# Patient Record
Sex: Female | Born: 1945 | Race: White | Hispanic: No | Marital: Married | Smoking: Former smoker
Health system: Southern US, Community
[De-identification: ages and names within clinical notes are randomized; demographics above are authoritative.]

## PROBLEM LIST (undated history)

## (undated) DIAGNOSIS — I10 Essential (primary) hypertension: Secondary | ICD-10-CM

## (undated) DIAGNOSIS — R5382 Chronic fatigue, unspecified: Secondary | ICD-10-CM

## (undated) DIAGNOSIS — M797 Fibromyalgia: Secondary | ICD-10-CM

## (undated) DIAGNOSIS — F32A Depression, unspecified: Secondary | ICD-10-CM

## (undated) DIAGNOSIS — G43909 Migraine, unspecified, not intractable, without status migrainosus: Secondary | ICD-10-CM

## (undated) DIAGNOSIS — R5383 Other fatigue: Secondary | ICD-10-CM

## (undated) DIAGNOSIS — Z951 Presence of aortocoronary bypass graft: Secondary | ICD-10-CM

## (undated) DIAGNOSIS — I639 Cerebral infarction, unspecified: Secondary | ICD-10-CM

## (undated) DIAGNOSIS — I219 Acute myocardial infarction, unspecified: Secondary | ICD-10-CM

## (undated) DIAGNOSIS — E785 Hyperlipidemia, unspecified: Secondary | ICD-10-CM

## (undated) DIAGNOSIS — F329 Major depressive disorder, single episode, unspecified: Secondary | ICD-10-CM

## (undated) HISTORY — DX: Chronic fatigue, unspecified: R53.82

## (undated) HISTORY — DX: Presence of aortocoronary bypass graft: Z95.1

## (undated) HISTORY — DX: Depression, unspecified: F32.A

## (undated) HISTORY — PX: BLADDER SURGERY: SHX569

## (undated) HISTORY — DX: Cerebral infarction, unspecified: I63.9

## (undated) HISTORY — DX: Fibromyalgia: M79.7

## (undated) HISTORY — PX: BREAST LUMPECTOMY: SHX2

## (undated) HISTORY — PX: BREAST BIOPSY: SHX20

## (undated) HISTORY — DX: Acute myocardial infarction, unspecified: I21.9

## (undated) HISTORY — PX: BREAST EXCISIONAL BIOPSY: SUR124

## (undated) HISTORY — PX: WRIST SURGERY: SHX841

## (undated) HISTORY — PX: VAGINA SURGERY: SHX829

## (undated) HISTORY — DX: Hyperlipidemia, unspecified: E78.5

## (undated) HISTORY — DX: Migraine, unspecified, not intractable, without status migrainosus: G43.909

## (undated) HISTORY — DX: Major depressive disorder, single episode, unspecified: F32.9

## (undated) HISTORY — PX: CORONARY ARTERY BYPASS GRAFT: SHX141

## (undated) HISTORY — PX: BACK SURGERY: SHX140

## (undated) HISTORY — PX: CARPAL TUNNEL RELEASE: SHX101

## (undated) HISTORY — PX: TONSILLECTOMY: SUR1361

## (undated) HISTORY — DX: Other fatigue: R53.83

## (undated) HISTORY — PX: APPENDECTOMY: SHX54

## (undated) HISTORY — PX: ABDOMINAL HYSTERECTOMY: SHX81

---

## 1978-01-10 HISTORY — PX: OTHER SURGICAL HISTORY: SHX169

## 2014-01-15 DIAGNOSIS — S52611D Displaced fracture of right ulna styloid process, subsequent encounter for closed fracture with routine healing: Secondary | ICD-10-CM | POA: Diagnosis not present

## 2014-01-15 DIAGNOSIS — M170 Bilateral primary osteoarthritis of knee: Secondary | ICD-10-CM | POA: Diagnosis not present

## 2014-01-15 DIAGNOSIS — S52551D Other extraarticular fracture of lower end of right radius, subsequent encounter for closed fracture with routine healing: Secondary | ICD-10-CM | POA: Diagnosis not present

## 2014-01-20 DIAGNOSIS — M47816 Spondylosis without myelopathy or radiculopathy, lumbar region: Secondary | ICD-10-CM | POA: Diagnosis not present

## 2014-01-20 DIAGNOSIS — M47817 Spondylosis without myelopathy or radiculopathy, lumbosacral region: Secondary | ICD-10-CM | POA: Diagnosis not present

## 2014-01-20 DIAGNOSIS — M545 Low back pain: Secondary | ICD-10-CM | POA: Diagnosis not present

## 2014-02-05 DIAGNOSIS — M545 Low back pain: Secondary | ICD-10-CM | POA: Diagnosis not present

## 2014-02-05 DIAGNOSIS — Z79891 Long term (current) use of opiate analgesic: Secondary | ICD-10-CM | POA: Diagnosis not present

## 2014-02-05 DIAGNOSIS — G894 Chronic pain syndrome: Secondary | ICD-10-CM | POA: Diagnosis not present

## 2014-02-05 DIAGNOSIS — M5136 Other intervertebral disc degeneration, lumbar region: Secondary | ICD-10-CM | POA: Diagnosis not present

## 2014-02-05 DIAGNOSIS — M47817 Spondylosis without myelopathy or radiculopathy, lumbosacral region: Secondary | ICD-10-CM | POA: Diagnosis not present

## 2014-02-05 DIAGNOSIS — M961 Postlaminectomy syndrome, not elsewhere classified: Secondary | ICD-10-CM | POA: Diagnosis not present

## 2014-02-21 DIAGNOSIS — R509 Fever, unspecified: Secondary | ICD-10-CM | POA: Diagnosis not present

## 2014-02-21 DIAGNOSIS — I10 Essential (primary) hypertension: Secondary | ICD-10-CM | POA: Diagnosis not present

## 2014-02-21 DIAGNOSIS — R062 Wheezing: Secondary | ICD-10-CM | POA: Diagnosis not present

## 2014-02-27 DIAGNOSIS — M1811 Unilateral primary osteoarthritis of first carpometacarpal joint, right hand: Secondary | ICD-10-CM | POA: Diagnosis not present

## 2014-02-27 DIAGNOSIS — M19011 Primary osteoarthritis, right shoulder: Secondary | ICD-10-CM | POA: Diagnosis not present

## 2014-03-14 DIAGNOSIS — F341 Dysthymic disorder: Secondary | ICD-10-CM | POA: Diagnosis not present

## 2014-03-14 DIAGNOSIS — F4322 Adjustment disorder with anxiety: Secondary | ICD-10-CM | POA: Diagnosis not present

## 2014-04-03 DIAGNOSIS — M47817 Spondylosis without myelopathy or radiculopathy, lumbosacral region: Secondary | ICD-10-CM | POA: Diagnosis not present

## 2014-04-03 DIAGNOSIS — M545 Low back pain: Secondary | ICD-10-CM | POA: Diagnosis not present

## 2014-04-03 DIAGNOSIS — M961 Postlaminectomy syndrome, not elsewhere classified: Secondary | ICD-10-CM | POA: Diagnosis not present

## 2014-04-03 DIAGNOSIS — G894 Chronic pain syndrome: Secondary | ICD-10-CM | POA: Diagnosis not present

## 2014-04-03 DIAGNOSIS — M5136 Other intervertebral disc degeneration, lumbar region: Secondary | ICD-10-CM | POA: Diagnosis not present

## 2014-04-17 DIAGNOSIS — I2510 Atherosclerotic heart disease of native coronary artery without angina pectoris: Secondary | ICD-10-CM | POA: Diagnosis not present

## 2014-04-17 DIAGNOSIS — I200 Unstable angina: Secondary | ICD-10-CM | POA: Diagnosis not present

## 2014-04-17 DIAGNOSIS — N644 Mastodynia: Secondary | ICD-10-CM | POA: Diagnosis not present

## 2014-04-17 DIAGNOSIS — I10 Essential (primary) hypertension: Secondary | ICD-10-CM | POA: Diagnosis not present

## 2014-04-18 DIAGNOSIS — I10 Essential (primary) hypertension: Secondary | ICD-10-CM | POA: Diagnosis not present

## 2014-04-18 DIAGNOSIS — Z01812 Encounter for preprocedural laboratory examination: Secondary | ICD-10-CM | POA: Diagnosis not present

## 2014-04-18 DIAGNOSIS — Z951 Presence of aortocoronary bypass graft: Secondary | ICD-10-CM | POA: Diagnosis not present

## 2014-04-18 DIAGNOSIS — Z79899 Other long term (current) drug therapy: Secondary | ICD-10-CM | POA: Diagnosis not present

## 2014-04-18 DIAGNOSIS — I2510 Atherosclerotic heart disease of native coronary artery without angina pectoris: Secondary | ICD-10-CM | POA: Diagnosis not present

## 2014-04-18 DIAGNOSIS — R9431 Abnormal electrocardiogram [ECG] [EKG]: Secondary | ICD-10-CM | POA: Diagnosis not present

## 2014-04-18 DIAGNOSIS — I25110 Atherosclerotic heart disease of native coronary artery with unstable angina pectoris: Secondary | ICD-10-CM | POA: Diagnosis not present

## 2014-04-18 DIAGNOSIS — Z01810 Encounter for preprocedural cardiovascular examination: Secondary | ICD-10-CM | POA: Diagnosis not present

## 2014-04-22 DIAGNOSIS — N644 Mastodynia: Secondary | ICD-10-CM | POA: Diagnosis not present

## 2014-05-30 DIAGNOSIS — M1811 Unilateral primary osteoarthritis of first carpometacarpal joint, right hand: Secondary | ICD-10-CM | POA: Diagnosis not present

## 2014-05-30 DIAGNOSIS — M19011 Primary osteoarthritis, right shoulder: Secondary | ICD-10-CM | POA: Diagnosis not present

## 2014-06-02 DIAGNOSIS — G894 Chronic pain syndrome: Secondary | ICD-10-CM | POA: Diagnosis not present

## 2014-06-02 DIAGNOSIS — M47817 Spondylosis without myelopathy or radiculopathy, lumbosacral region: Secondary | ICD-10-CM | POA: Diagnosis not present

## 2014-06-02 DIAGNOSIS — M5136 Other intervertebral disc degeneration, lumbar region: Secondary | ICD-10-CM | POA: Diagnosis not present

## 2014-06-02 DIAGNOSIS — M545 Low back pain: Secondary | ICD-10-CM | POA: Diagnosis not present

## 2014-06-02 DIAGNOSIS — M961 Postlaminectomy syndrome, not elsewhere classified: Secondary | ICD-10-CM | POA: Diagnosis not present

## 2014-06-19 DIAGNOSIS — Z48812 Encounter for surgical aftercare following surgery on the circulatory system: Secondary | ICD-10-CM | POA: Diagnosis not present

## 2014-06-19 DIAGNOSIS — I2510 Atherosclerotic heart disease of native coronary artery without angina pectoris: Secondary | ICD-10-CM | POA: Diagnosis not present

## 2014-07-10 DIAGNOSIS — I10 Essential (primary) hypertension: Secondary | ICD-10-CM | POA: Diagnosis not present

## 2014-07-10 DIAGNOSIS — N3010 Interstitial cystitis (chronic) without hematuria: Secondary | ICD-10-CM | POA: Diagnosis not present

## 2014-07-10 DIAGNOSIS — R300 Dysuria: Secondary | ICD-10-CM | POA: Diagnosis not present

## 2014-07-23 DIAGNOSIS — R7301 Impaired fasting glucose: Secondary | ICD-10-CM | POA: Diagnosis not present

## 2014-07-23 DIAGNOSIS — R61 Generalized hyperhidrosis: Secondary | ICD-10-CM | POA: Diagnosis not present

## 2014-07-23 DIAGNOSIS — E785 Hyperlipidemia, unspecified: Secondary | ICD-10-CM | POA: Diagnosis not present

## 2014-07-23 DIAGNOSIS — I10 Essential (primary) hypertension: Secondary | ICD-10-CM | POA: Diagnosis not present

## 2014-07-23 DIAGNOSIS — F329 Major depressive disorder, single episode, unspecified: Secondary | ICD-10-CM | POA: Diagnosis not present

## 2014-07-23 DIAGNOSIS — M961 Postlaminectomy syndrome, not elsewhere classified: Secondary | ICD-10-CM | POA: Diagnosis not present

## 2014-08-05 DIAGNOSIS — G894 Chronic pain syndrome: Secondary | ICD-10-CM | POA: Diagnosis not present

## 2014-08-06 DIAGNOSIS — G894 Chronic pain syndrome: Secondary | ICD-10-CM | POA: Diagnosis not present

## 2014-08-13 DIAGNOSIS — M961 Postlaminectomy syndrome, not elsewhere classified: Secondary | ICD-10-CM | POA: Diagnosis not present

## 2014-08-13 DIAGNOSIS — M47817 Spondylosis without myelopathy or radiculopathy, lumbosacral region: Secondary | ICD-10-CM | POA: Diagnosis not present

## 2014-08-13 DIAGNOSIS — M545 Low back pain: Secondary | ICD-10-CM | POA: Diagnosis not present

## 2014-08-13 DIAGNOSIS — G894 Chronic pain syndrome: Secondary | ICD-10-CM | POA: Diagnosis not present

## 2014-08-13 DIAGNOSIS — M5136 Other intervertebral disc degeneration, lumbar region: Secondary | ICD-10-CM | POA: Diagnosis not present

## 2014-08-21 DIAGNOSIS — G4700 Insomnia, unspecified: Secondary | ICD-10-CM | POA: Diagnosis not present

## 2014-08-21 DIAGNOSIS — R61 Generalized hyperhidrosis: Secondary | ICD-10-CM | POA: Diagnosis not present

## 2014-08-21 DIAGNOSIS — R350 Frequency of micturition: Secondary | ICD-10-CM | POA: Diagnosis not present

## 2014-08-22 DIAGNOSIS — G894 Chronic pain syndrome: Secondary | ICD-10-CM | POA: Diagnosis not present

## 2014-08-26 DIAGNOSIS — M5416 Radiculopathy, lumbar region: Secondary | ICD-10-CM | POA: Diagnosis not present

## 2014-08-26 DIAGNOSIS — M961 Postlaminectomy syndrome, not elsewhere classified: Secondary | ICD-10-CM | POA: Diagnosis not present

## 2014-08-26 DIAGNOSIS — G894 Chronic pain syndrome: Secondary | ICD-10-CM | POA: Diagnosis not present

## 2014-08-26 DIAGNOSIS — M545 Low back pain: Secondary | ICD-10-CM | POA: Diagnosis not present

## 2014-09-10 DIAGNOSIS — M5416 Radiculopathy, lumbar region: Secondary | ICD-10-CM | POA: Diagnosis not present

## 2014-09-10 DIAGNOSIS — M545 Low back pain: Secondary | ICD-10-CM | POA: Diagnosis not present

## 2014-09-10 DIAGNOSIS — M961 Postlaminectomy syndrome, not elsewhere classified: Secondary | ICD-10-CM | POA: Diagnosis not present

## 2014-09-10 DIAGNOSIS — M47817 Spondylosis without myelopathy or radiculopathy, lumbosacral region: Secondary | ICD-10-CM | POA: Diagnosis not present

## 2014-09-10 DIAGNOSIS — G894 Chronic pain syndrome: Secondary | ICD-10-CM | POA: Diagnosis not present

## 2014-09-10 DIAGNOSIS — M5136 Other intervertebral disc degeneration, lumbar region: Secondary | ICD-10-CM | POA: Diagnosis not present

## 2014-09-24 DIAGNOSIS — L74519 Primary focal hyperhidrosis, unspecified: Secondary | ICD-10-CM | POA: Diagnosis not present

## 2014-09-26 DIAGNOSIS — L74519 Primary focal hyperhidrosis, unspecified: Secondary | ICD-10-CM | POA: Diagnosis not present

## 2014-10-06 DIAGNOSIS — Z79891 Long term (current) use of opiate analgesic: Secondary | ICD-10-CM | POA: Diagnosis not present

## 2014-10-06 DIAGNOSIS — M47817 Spondylosis without myelopathy or radiculopathy, lumbosacral region: Secondary | ICD-10-CM | POA: Diagnosis not present

## 2014-10-06 DIAGNOSIS — G894 Chronic pain syndrome: Secondary | ICD-10-CM | POA: Diagnosis not present

## 2014-10-06 DIAGNOSIS — M545 Low back pain: Secondary | ICD-10-CM | POA: Diagnosis not present

## 2014-10-06 DIAGNOSIS — M5136 Other intervertebral disc degeneration, lumbar region: Secondary | ICD-10-CM | POA: Diagnosis not present

## 2014-10-06 DIAGNOSIS — M5416 Radiculopathy, lumbar region: Secondary | ICD-10-CM | POA: Diagnosis not present

## 2014-10-06 DIAGNOSIS — M961 Postlaminectomy syndrome, not elsewhere classified: Secondary | ICD-10-CM | POA: Diagnosis not present

## 2014-10-10 DIAGNOSIS — M7551 Bursitis of right shoulder: Secondary | ICD-10-CM | POA: Diagnosis not present

## 2014-10-10 DIAGNOSIS — M1811 Unilateral primary osteoarthritis of first carpometacarpal joint, right hand: Secondary | ICD-10-CM | POA: Diagnosis not present

## 2014-11-05 DIAGNOSIS — D225 Melanocytic nevi of trunk: Secondary | ICD-10-CM | POA: Diagnosis not present

## 2014-11-05 DIAGNOSIS — H52223 Regular astigmatism, bilateral: Secondary | ICD-10-CM | POA: Diagnosis not present

## 2014-11-05 DIAGNOSIS — L538 Other specified erythematous conditions: Secondary | ICD-10-CM | POA: Diagnosis not present

## 2014-11-05 DIAGNOSIS — L821 Other seborrheic keratosis: Secondary | ICD-10-CM | POA: Diagnosis not present

## 2014-11-05 DIAGNOSIS — D485 Neoplasm of uncertain behavior of skin: Secondary | ICD-10-CM | POA: Diagnosis not present

## 2014-11-05 DIAGNOSIS — H35351 Cystoid macular degeneration, right eye: Secondary | ICD-10-CM | POA: Diagnosis not present

## 2014-11-05 DIAGNOSIS — H524 Presbyopia: Secondary | ICD-10-CM | POA: Diagnosis not present

## 2014-11-05 DIAGNOSIS — Z961 Presence of intraocular lens: Secondary | ICD-10-CM | POA: Diagnosis not present

## 2014-11-05 DIAGNOSIS — L720 Epidermal cyst: Secondary | ICD-10-CM | POA: Diagnosis not present

## 2014-11-05 DIAGNOSIS — D3131 Benign neoplasm of right choroid: Secondary | ICD-10-CM | POA: Diagnosis not present

## 2014-11-05 DIAGNOSIS — R202 Paresthesia of skin: Secondary | ICD-10-CM | POA: Diagnosis not present

## 2014-11-05 DIAGNOSIS — H5203 Hypermetropia, bilateral: Secondary | ICD-10-CM | POA: Diagnosis not present

## 2014-11-05 DIAGNOSIS — L298 Other pruritus: Secondary | ICD-10-CM | POA: Diagnosis not present

## 2014-11-05 DIAGNOSIS — L820 Inflamed seborrheic keratosis: Secondary | ICD-10-CM | POA: Diagnosis not present

## 2014-11-06 DIAGNOSIS — R61 Generalized hyperhidrosis: Secondary | ICD-10-CM | POA: Diagnosis not present

## 2014-11-06 DIAGNOSIS — Z23 Encounter for immunization: Secondary | ICD-10-CM | POA: Diagnosis not present

## 2014-11-14 DIAGNOSIS — I2510 Atherosclerotic heart disease of native coronary artery without angina pectoris: Secondary | ICD-10-CM | POA: Diagnosis not present

## 2014-11-14 DIAGNOSIS — R0689 Other abnormalities of breathing: Secondary | ICD-10-CM | POA: Diagnosis not present

## 2014-11-14 DIAGNOSIS — I10 Essential (primary) hypertension: Secondary | ICD-10-CM | POA: Diagnosis not present

## 2014-11-14 DIAGNOSIS — R079 Chest pain, unspecified: Secondary | ICD-10-CM | POA: Diagnosis not present

## 2014-11-19 DIAGNOSIS — D225 Melanocytic nevi of trunk: Secondary | ICD-10-CM | POA: Diagnosis not present

## 2014-11-24 DIAGNOSIS — R001 Bradycardia, unspecified: Secondary | ICD-10-CM | POA: Diagnosis not present

## 2014-11-24 DIAGNOSIS — I10 Essential (primary) hypertension: Secondary | ICD-10-CM | POA: Diagnosis not present

## 2014-11-24 DIAGNOSIS — I2510 Atherosclerotic heart disease of native coronary artery without angina pectoris: Secondary | ICD-10-CM | POA: Diagnosis not present

## 2014-11-24 DIAGNOSIS — I517 Cardiomegaly: Secondary | ICD-10-CM | POA: Diagnosis not present

## 2014-11-25 DIAGNOSIS — H35373 Puckering of macula, bilateral: Secondary | ICD-10-CM | POA: Diagnosis not present

## 2014-11-25 DIAGNOSIS — D3131 Benign neoplasm of right choroid: Secondary | ICD-10-CM | POA: Diagnosis not present

## 2014-12-01 DIAGNOSIS — I10 Essential (primary) hypertension: Secondary | ICD-10-CM | POA: Diagnosis not present

## 2014-12-01 DIAGNOSIS — I2510 Atherosclerotic heart disease of native coronary artery without angina pectoris: Secondary | ICD-10-CM | POA: Diagnosis not present

## 2014-12-01 DIAGNOSIS — R079 Chest pain, unspecified: Secondary | ICD-10-CM | POA: Diagnosis not present

## 2014-12-01 DIAGNOSIS — E785 Hyperlipidemia, unspecified: Secondary | ICD-10-CM | POA: Diagnosis not present

## 2014-12-03 DIAGNOSIS — G894 Chronic pain syndrome: Secondary | ICD-10-CM | POA: Diagnosis not present

## 2014-12-03 DIAGNOSIS — M47817 Spondylosis without myelopathy or radiculopathy, lumbosacral region: Secondary | ICD-10-CM | POA: Diagnosis not present

## 2014-12-03 DIAGNOSIS — M5136 Other intervertebral disc degeneration, lumbar region: Secondary | ICD-10-CM | POA: Diagnosis not present

## 2014-12-03 DIAGNOSIS — M5416 Radiculopathy, lumbar region: Secondary | ICD-10-CM | POA: Diagnosis not present

## 2014-12-03 DIAGNOSIS — M545 Low back pain: Secondary | ICD-10-CM | POA: Diagnosis not present

## 2014-12-03 DIAGNOSIS — M961 Postlaminectomy syndrome, not elsewhere classified: Secondary | ICD-10-CM | POA: Diagnosis not present

## 2014-12-12 DIAGNOSIS — I10 Essential (primary) hypertension: Secondary | ICD-10-CM | POA: Diagnosis not present

## 2014-12-12 DIAGNOSIS — R7301 Impaired fasting glucose: Secondary | ICD-10-CM | POA: Diagnosis not present

## 2014-12-12 DIAGNOSIS — I209 Angina pectoris, unspecified: Secondary | ICD-10-CM | POA: Diagnosis not present

## 2014-12-12 DIAGNOSIS — R197 Diarrhea, unspecified: Secondary | ICD-10-CM | POA: Diagnosis not present

## 2015-02-03 DIAGNOSIS — G894 Chronic pain syndrome: Secondary | ICD-10-CM | POA: Diagnosis not present

## 2015-02-03 DIAGNOSIS — M545 Low back pain: Secondary | ICD-10-CM | POA: Diagnosis not present

## 2015-02-03 DIAGNOSIS — M5136 Other intervertebral disc degeneration, lumbar region: Secondary | ICD-10-CM | POA: Diagnosis not present

## 2015-02-03 DIAGNOSIS — M961 Postlaminectomy syndrome, not elsewhere classified: Secondary | ICD-10-CM | POA: Diagnosis not present

## 2015-02-03 DIAGNOSIS — Z79891 Long term (current) use of opiate analgesic: Secondary | ICD-10-CM | POA: Diagnosis not present

## 2015-02-03 DIAGNOSIS — M47817 Spondylosis without myelopathy or radiculopathy, lumbosacral region: Secondary | ICD-10-CM | POA: Diagnosis not present

## 2015-02-03 DIAGNOSIS — M5416 Radiculopathy, lumbar region: Secondary | ICD-10-CM | POA: Diagnosis not present

## 2015-02-06 DIAGNOSIS — R42 Dizziness and giddiness: Secondary | ICD-10-CM | POA: Diagnosis not present

## 2015-02-06 DIAGNOSIS — R002 Palpitations: Secondary | ICD-10-CM | POA: Diagnosis not present

## 2015-02-12 DIAGNOSIS — R008 Other abnormalities of heart beat: Secondary | ICD-10-CM | POA: Diagnosis not present

## 2015-02-12 DIAGNOSIS — I491 Atrial premature depolarization: Secondary | ICD-10-CM | POA: Diagnosis not present

## 2015-02-13 DIAGNOSIS — I10 Essential (primary) hypertension: Secondary | ICD-10-CM | POA: Diagnosis not present

## 2015-02-13 DIAGNOSIS — F418 Other specified anxiety disorders: Secondary | ICD-10-CM | POA: Diagnosis not present

## 2015-02-13 DIAGNOSIS — R7301 Impaired fasting glucose: Secondary | ICD-10-CM | POA: Diagnosis not present

## 2015-02-13 DIAGNOSIS — E785 Hyperlipidemia, unspecified: Secondary | ICD-10-CM | POA: Diagnosis not present

## 2015-02-25 DIAGNOSIS — I10 Essential (primary) hypertension: Secondary | ICD-10-CM | POA: Diagnosis not present

## 2015-02-25 DIAGNOSIS — I2510 Atherosclerotic heart disease of native coronary artery without angina pectoris: Secondary | ICD-10-CM | POA: Diagnosis not present

## 2015-02-25 DIAGNOSIS — I4892 Unspecified atrial flutter: Secondary | ICD-10-CM | POA: Diagnosis not present

## 2015-03-19 DIAGNOSIS — R7301 Impaired fasting glucose: Secondary | ICD-10-CM | POA: Diagnosis not present

## 2015-03-19 DIAGNOSIS — I10 Essential (primary) hypertension: Secondary | ICD-10-CM | POA: Diagnosis not present

## 2015-03-19 DIAGNOSIS — I499 Cardiac arrhythmia, unspecified: Secondary | ICD-10-CM | POA: Diagnosis not present

## 2015-03-19 DIAGNOSIS — M810 Age-related osteoporosis without current pathological fracture: Secondary | ICD-10-CM | POA: Diagnosis not present

## 2015-03-24 DIAGNOSIS — W891XXA Exposure to tanning bed, initial encounter: Secondary | ICD-10-CM | POA: Diagnosis not present

## 2015-03-24 DIAGNOSIS — L74519 Primary focal hyperhidrosis, unspecified: Secondary | ICD-10-CM | POA: Diagnosis not present

## 2015-03-24 DIAGNOSIS — D1801 Hemangioma of skin and subcutaneous tissue: Secondary | ICD-10-CM | POA: Diagnosis not present

## 2015-03-24 DIAGNOSIS — L821 Other seborrheic keratosis: Secondary | ICD-10-CM | POA: Diagnosis not present

## 2015-03-24 DIAGNOSIS — M810 Age-related osteoporosis without current pathological fracture: Secondary | ICD-10-CM | POA: Diagnosis not present

## 2015-03-24 DIAGNOSIS — Z872 Personal history of diseases of the skin and subcutaneous tissue: Secondary | ICD-10-CM | POA: Diagnosis not present

## 2015-03-24 DIAGNOSIS — L814 Other melanin hyperpigmentation: Secondary | ICD-10-CM | POA: Diagnosis not present

## 2015-03-24 DIAGNOSIS — Z1231 Encounter for screening mammogram for malignant neoplasm of breast: Secondary | ICD-10-CM | POA: Diagnosis not present

## 2015-03-25 DIAGNOSIS — M5136 Other intervertebral disc degeneration, lumbar region: Secondary | ICD-10-CM | POA: Diagnosis not present

## 2015-03-25 DIAGNOSIS — M545 Low back pain: Secondary | ICD-10-CM | POA: Diagnosis not present

## 2015-03-25 DIAGNOSIS — G894 Chronic pain syndrome: Secondary | ICD-10-CM | POA: Diagnosis not present

## 2015-03-25 DIAGNOSIS — M961 Postlaminectomy syndrome, not elsewhere classified: Secondary | ICD-10-CM | POA: Diagnosis not present

## 2015-03-25 DIAGNOSIS — M47817 Spondylosis without myelopathy or radiculopathy, lumbosacral region: Secondary | ICD-10-CM | POA: Diagnosis not present

## 2015-03-25 DIAGNOSIS — M5416 Radiculopathy, lumbar region: Secondary | ICD-10-CM | POA: Diagnosis not present

## 2015-03-27 DIAGNOSIS — I10 Essential (primary) hypertension: Secondary | ICD-10-CM | POA: Diagnosis not present

## 2015-03-27 DIAGNOSIS — F329 Major depressive disorder, single episode, unspecified: Secondary | ICD-10-CM | POA: Diagnosis not present

## 2015-03-27 DIAGNOSIS — M810 Age-related osteoporosis without current pathological fracture: Secondary | ICD-10-CM | POA: Diagnosis not present

## 2015-03-27 DIAGNOSIS — R7301 Impaired fasting glucose: Secondary | ICD-10-CM | POA: Diagnosis not present

## 2015-04-03 DIAGNOSIS — I4892 Unspecified atrial flutter: Secondary | ICD-10-CM | POA: Diagnosis not present

## 2015-04-03 DIAGNOSIS — I10 Essential (primary) hypertension: Secondary | ICD-10-CM | POA: Diagnosis not present

## 2015-04-29 DIAGNOSIS — M549 Dorsalgia, unspecified: Secondary | ICD-10-CM | POA: Diagnosis not present

## 2015-04-29 DIAGNOSIS — G4700 Insomnia, unspecified: Secondary | ICD-10-CM | POA: Diagnosis not present

## 2015-04-29 DIAGNOSIS — N898 Other specified noninflammatory disorders of vagina: Secondary | ICD-10-CM | POA: Diagnosis not present

## 2015-04-29 DIAGNOSIS — N390 Urinary tract infection, site not specified: Secondary | ICD-10-CM | POA: Diagnosis not present

## 2015-05-11 DIAGNOSIS — R350 Frequency of micturition: Secondary | ICD-10-CM | POA: Diagnosis not present

## 2015-05-11 DIAGNOSIS — B379 Candidiasis, unspecified: Secondary | ICD-10-CM | POA: Diagnosis not present

## 2015-05-11 DIAGNOSIS — L089 Local infection of the skin and subcutaneous tissue, unspecified: Secondary | ICD-10-CM | POA: Diagnosis not present

## 2015-05-11 DIAGNOSIS — T3695XA Adverse effect of unspecified systemic antibiotic, initial encounter: Secondary | ICD-10-CM | POA: Diagnosis not present

## 2015-05-19 DIAGNOSIS — R300 Dysuria: Secondary | ICD-10-CM | POA: Diagnosis not present

## 2015-05-19 DIAGNOSIS — I10 Essential (primary) hypertension: Secondary | ICD-10-CM | POA: Diagnosis not present

## 2015-05-19 DIAGNOSIS — R61 Generalized hyperhidrosis: Secondary | ICD-10-CM | POA: Diagnosis not present

## 2015-05-19 DIAGNOSIS — L089 Local infection of the skin and subcutaneous tissue, unspecified: Secondary | ICD-10-CM | POA: Diagnosis not present

## 2015-05-19 DIAGNOSIS — G4700 Insomnia, unspecified: Secondary | ICD-10-CM | POA: Diagnosis not present

## 2015-05-20 DIAGNOSIS — T3695XA Adverse effect of unspecified systemic antibiotic, initial encounter: Secondary | ICD-10-CM | POA: Diagnosis not present

## 2015-05-20 DIAGNOSIS — B379 Candidiasis, unspecified: Secondary | ICD-10-CM | POA: Diagnosis not present

## 2015-05-20 DIAGNOSIS — R300 Dysuria: Secondary | ICD-10-CM | POA: Diagnosis not present

## 2015-05-20 DIAGNOSIS — N949 Unspecified condition associated with female genital organs and menstrual cycle: Secondary | ICD-10-CM | POA: Diagnosis not present

## 2015-06-17 DIAGNOSIS — M47817 Spondylosis without myelopathy or radiculopathy, lumbosacral region: Secondary | ICD-10-CM | POA: Diagnosis not present

## 2015-06-17 DIAGNOSIS — M961 Postlaminectomy syndrome, not elsewhere classified: Secondary | ICD-10-CM | POA: Diagnosis not present

## 2015-06-17 DIAGNOSIS — M5136 Other intervertebral disc degeneration, lumbar region: Secondary | ICD-10-CM | POA: Diagnosis not present

## 2015-06-17 DIAGNOSIS — M5416 Radiculopathy, lumbar region: Secondary | ICD-10-CM | POA: Diagnosis not present

## 2015-06-17 DIAGNOSIS — Z79891 Long term (current) use of opiate analgesic: Secondary | ICD-10-CM | POA: Diagnosis not present

## 2015-06-17 DIAGNOSIS — M545 Low back pain: Secondary | ICD-10-CM | POA: Diagnosis not present

## 2015-06-17 DIAGNOSIS — G894 Chronic pain syndrome: Secondary | ICD-10-CM | POA: Diagnosis not present

## 2015-06-17 DIAGNOSIS — M542 Cervicalgia: Secondary | ICD-10-CM | POA: Diagnosis not present

## 2015-06-17 DIAGNOSIS — M5412 Radiculopathy, cervical region: Secondary | ICD-10-CM | POA: Diagnosis not present

## 2015-06-23 DIAGNOSIS — M5412 Radiculopathy, cervical region: Secondary | ICD-10-CM | POA: Diagnosis not present

## 2015-06-23 DIAGNOSIS — H35373 Puckering of macula, bilateral: Secondary | ICD-10-CM | POA: Diagnosis not present

## 2015-06-23 DIAGNOSIS — D3131 Benign neoplasm of right choroid: Secondary | ICD-10-CM | POA: Diagnosis not present

## 2015-06-23 DIAGNOSIS — M4802 Spinal stenosis, cervical region: Secondary | ICD-10-CM | POA: Diagnosis not present

## 2015-06-23 DIAGNOSIS — M47892 Other spondylosis, cervical region: Secondary | ICD-10-CM | POA: Diagnosis not present

## 2015-06-23 DIAGNOSIS — M4722 Other spondylosis with radiculopathy, cervical region: Secondary | ICD-10-CM | POA: Diagnosis not present

## 2015-06-23 DIAGNOSIS — M2578 Osteophyte, vertebrae: Secondary | ICD-10-CM | POA: Diagnosis not present

## 2015-06-24 DIAGNOSIS — D649 Anemia, unspecified: Secondary | ICD-10-CM | POA: Diagnosis not present

## 2015-06-24 DIAGNOSIS — I10 Essential (primary) hypertension: Secondary | ICD-10-CM | POA: Diagnosis not present

## 2015-06-24 DIAGNOSIS — Z0000 Encounter for general adult medical examination without abnormal findings: Secondary | ICD-10-CM | POA: Diagnosis not present

## 2015-06-24 DIAGNOSIS — E785 Hyperlipidemia, unspecified: Secondary | ICD-10-CM | POA: Diagnosis not present

## 2015-06-24 DIAGNOSIS — Z6835 Body mass index (BMI) 35.0-35.9, adult: Secondary | ICD-10-CM | POA: Diagnosis not present

## 2015-06-24 DIAGNOSIS — E669 Obesity, unspecified: Secondary | ICD-10-CM | POA: Diagnosis not present

## 2015-06-24 DIAGNOSIS — J45909 Unspecified asthma, uncomplicated: Secondary | ICD-10-CM | POA: Diagnosis not present

## 2015-06-30 DIAGNOSIS — R61 Generalized hyperhidrosis: Secondary | ICD-10-CM | POA: Diagnosis not present

## 2015-06-30 DIAGNOSIS — N3001 Acute cystitis with hematuria: Secondary | ICD-10-CM | POA: Diagnosis not present

## 2015-06-30 DIAGNOSIS — N760 Acute vaginitis: Secondary | ICD-10-CM | POA: Diagnosis not present

## 2015-07-09 DIAGNOSIS — M542 Cervicalgia: Secondary | ICD-10-CM | POA: Diagnosis not present

## 2015-07-09 DIAGNOSIS — G894 Chronic pain syndrome: Secondary | ICD-10-CM | POA: Diagnosis not present

## 2015-07-09 DIAGNOSIS — M961 Postlaminectomy syndrome, not elsewhere classified: Secondary | ICD-10-CM | POA: Diagnosis not present

## 2015-07-09 DIAGNOSIS — M5136 Other intervertebral disc degeneration, lumbar region: Secondary | ICD-10-CM | POA: Diagnosis not present

## 2015-07-09 DIAGNOSIS — M5412 Radiculopathy, cervical region: Secondary | ICD-10-CM | POA: Diagnosis not present

## 2015-07-09 DIAGNOSIS — M5416 Radiculopathy, lumbar region: Secondary | ICD-10-CM | POA: Diagnosis not present

## 2015-07-09 DIAGNOSIS — M545 Low back pain: Secondary | ICD-10-CM | POA: Diagnosis not present

## 2015-07-09 DIAGNOSIS — M47817 Spondylosis without myelopathy or radiculopathy, lumbosacral region: Secondary | ICD-10-CM | POA: Diagnosis not present

## 2015-07-17 DIAGNOSIS — M1811 Unilateral primary osteoarthritis of first carpometacarpal joint, right hand: Secondary | ICD-10-CM | POA: Diagnosis not present

## 2015-07-17 DIAGNOSIS — M7551 Bursitis of right shoulder: Secondary | ICD-10-CM | POA: Diagnosis not present

## 2015-07-27 DIAGNOSIS — N3001 Acute cystitis with hematuria: Secondary | ICD-10-CM | POA: Diagnosis not present

## 2015-07-27 DIAGNOSIS — G894 Chronic pain syndrome: Secondary | ICD-10-CM | POA: Diagnosis not present

## 2015-08-07 ENCOUNTER — Emergency Department (INDEPENDENT_AMBULATORY_CARE_PROVIDER_SITE_OTHER)
Admission: EM | Admit: 2015-08-07 | Discharge: 2015-08-07 | Disposition: A | Discharge status: Home / Self Care | Payer: Medicare Other | Source: Home / Self Care | Attending: Family Medicine | Admitting: Family Medicine

## 2015-08-07 ENCOUNTER — Encounter: Payer: Self-pay | Admitting: *Deleted

## 2015-08-07 DIAGNOSIS — R300 Dysuria: Secondary | ICD-10-CM

## 2015-08-07 DIAGNOSIS — N390 Urinary tract infection, site not specified: Secondary | ICD-10-CM | POA: Diagnosis not present

## 2015-08-07 DIAGNOSIS — R350 Frequency of micturition: Secondary | ICD-10-CM | POA: Diagnosis not present

## 2015-08-07 HISTORY — DX: Essential (primary) hypertension: I10

## 2015-08-07 LAB — POCT URINALYSIS DIP (MANUAL ENTRY)
Glucose, UA: 100 — AB
Nitrite, UA: POSITIVE — AB
Protein Ur, POC: >=300 — AB
Spec Grav, UA: 1.02 (ref 1.005–1.03)
Urobilinogen, UA: >=8 (ref 0–1)
pH, UA: 5 (ref 5–8)

## 2015-08-07 MED ORDER — NITROFURANTOIN MONOHYD MACRO 100 MG PO CAPS: 100.0000 mg | ORAL_CAPSULE | Freq: Two times a day (BID) | ORAL | 0 refills | Status: DC

## 2015-08-07 MED ORDER — CEPHALEXIN 500 MG PO CAPS: 500.0000 mg | ORAL_CAPSULE | Freq: Two times a day (BID) | ORAL | 0 refills | Status: DC

## 2015-08-07 NOTE — ED Provider Notes (Signed)
CSN: [id number]     Arrival date & time 08/07/15  1504 History   First MD Initiated Contact with Patient 08/07/15 1531     Chief Complaint  Patient presents with  . Dysuria   (Consider location/radiation/quality/duration/timing/severity/associated sxs/prior Treatment) HPI Carrie Salazar is a 70 y.o. female presenting to UC with c/o intermittent urinary symptoms over the last 4 months with multiple rounds of antibiotics. Pt moved from [place] 1 week ago and started having worsening urinary symptoms with urinary frequency for 4 days and dysuria with lower abdominal discomfort for about 2 days. She has taken Azo with mild relief.  She has not been seen by a urologist as she her house sold and she moved sooner than she could f/u with a specialist. She was last on Doxycycline. Denies fever, chills, n/v/d. Besides the last 4 months, her last UTI was about 20-30 years ago.     Past Medical History:  Diagnosis Date  . Heart disease   . Hypertension    Past Surgical History:  Procedure Laterality Date  . ABDOMINAL HYSTERECTOMY    . APPENDECTOMY    . BACK SURGERY    . BLADDER SURGERY    . BREAST LUMPECTOMY    . BYPASS AXILLA/BRACHIAL ARTERY     History reviewed. No pertinent family history. Social History  Substance Use Topics  . Smoking status: Former Research scientist (life sciences)  . Smokeless tobacco: Never Used  . Alcohol use Yes     Comment: 1 q wk   OB History    No data available     Review of Systems  Constitutional: Negative for chills and fever.  Gastrointestinal: Positive for abdominal pain ("bladder pain"). Negative for diarrhea, nausea and vomiting.  Genitourinary: Positive for dysuria, frequency, pelvic pain and urgency. Negative for decreased urine volume, flank pain, hematuria, vaginal bleeding, vaginal discharge and vaginal pain.  Musculoskeletal: Negative for back pain and myalgias.  Neurological: Negative for syncope, light-headedness and headaches.    Allergies  Levaquin  [levofloxacin in d5w]; Codeine; Ivp dye [iodinated diagnostic agents]; Percodan [oxycodone-aspirin]; and Penicillins  Home Medications   Prior to Admission medications   Medication Sig Start Date End Date Taking? Authorizing Provider  Apixaban (ELIQUIS PO) Take by mouth.   Yes Historical Provider, MD  aspirin 81 MG tablet Take 81 mg by mouth daily.   Yes Historical Provider, MD  carvedilol (COREG) 12.5 MG tablet Take 12.5 mg by mouth 2 (two) times daily with a meal.   Yes Historical Provider, MD  DULoxetine (CYMBALTA) 30 MG capsule Take 30 mg by mouth daily.   Yes Historical Provider, MD  gabapentin (NEURONTIN) 300 MG capsule Take 300 mg by mouth 3 (three) times daily.   Yes Historical Provider, MD  lactobacillus acidophilus (BACID) TABS tablet Take 2 tablets by mouth 3 (three) times daily.   Yes Historical Provider, MD  lisinopril (PRINIVIL,ZESTRIL) 10 MG tablet Take 10 mg by mouth daily.   Yes Historical Provider, MD  pravastatin (PRAVACHOL) 20 MG tablet Take 20 mg by mouth daily.   Yes Historical Provider, MD  ranolazine (RANEXA) 500 MG 12 hr tablet Take 500 mg by mouth 2 (two) times daily.   Yes Historical Provider, MD  cephALEXin (KEFLEX) 500 MG capsule Take 1 capsule (500 mg total) by mouth 2 (two) times daily. For 7 days 08/07/15   Noland Fordyce, PA-C   Meds Ordered and Administered this Visit  Medications - No data to display  BP 134/72 (BP Location: Left Arm)   Pulse 66  Temp 97.5 F (36.4 C) (Oral)   Resp 16   Ht 5\' 5"  (1.651 m)   Wt 210 lb (95.3 kg)   SpO2 96%   BMI 34.95 kg/m  No data found.   Physical Exam  Constitutional: She is oriented to person, place, and time. She appears well-developed and well-nourished.  HENT:  Head: Normocephalic and atraumatic.  Eyes: EOM are normal.  Neck: Normal range of motion.  Cardiovascular: Normal rate.   Pulmonary/Chest: Effort normal.  Abdominal: Soft. She exhibits no distension and no mass. There is tenderness. There is no  rebound, no guarding and no CVA tenderness.  Mild suprapubic tenderness and left lower pelvic tenderness.   Musculoskeletal: Normal range of motion.  Neurological: She is alert and oriented to person, place, and time.  Skin: Skin is warm and dry.  Psychiatric: She has a normal mood and affect. Her behavior is normal.  Nursing note and vitals reviewed.   Urgent Care Course   Clinical Course    Procedures (including critical care time)  Labs Review Labs Reviewed  POCT URINALYSIS DIP (MANUAL ENTRY) - Abnormal; Notable for the following:       Result Value   Color, UA orange (*)    Glucose, UA =100 (*)    Bilirubin, UA moderate (*)    Ketones, POC UA small (15) (*)    Blood, UA small (*)    Protein Ur, POC >=300 (*)    Nitrite, UA Positive (*)    Leukocytes, UA large (3+) (*)    All other components within normal limits  URINE CULTURE    Imaging Review No results found.    MDM   1. Dysuria   2. UTI (lower urinary tract infection)   3. Urinary frequency    Pt c/o urinary symptoms for about 4 days but recurrent UTIs for 4 months. Pt appears well hydrated, NAD. Afebrile.   UA c/w UTI. Will send culture.  Rx: keflex (hx of hives with PCN but not anaphylaxis)  Advised to call if she develops rash or other allergic symptoms with Keflex. Will notify her when culture results come in. Encouraged f/u with PCP and urology, resource guide provided. May continue OTC azo, encouraged to stay well hydrated. Patient verbalized understanding and agreement with treatment plan.     Noland Fordyce, PA-C 08/07/15 1624

## 2015-08-07 NOTE — ED Triage Notes (Signed)
Pt c/o urinary frequency x 4 days with dysuria x 2 days. Denies fever. She reports a hx of recurrent UTI's x 4 mths.

## 2015-08-09 ENCOUNTER — Telehealth: Payer: Self-pay | Admitting: Emergency Medicine

## 2015-08-09 NOTE — Telephone Encounter (Signed)
Pt states that she is feeling somewhat better, the urgency and frequency is better.  Will f/u w/PCP if no better.  Urine culture results pending.  [place]

## 2015-08-10 ENCOUNTER — Telehealth: Payer: Self-pay | Admitting: *Deleted

## 2015-08-10 LAB — URINE CULTURE: Colony Count: >=100000

## 2015-08-10 NOTE — Telephone Encounter (Signed)
Spoke to pt given Ucx results. She reports that urinary s/s are resolving. She c/o diarrhea with ABT. She is taking probiotic. Advised her to try to eat yogurt and take ABT with food. She has a f/u appt with Dr Sheppard Coil on 08/13/15.

## 2015-08-13 ENCOUNTER — Encounter: Payer: Self-pay | Admitting: Osteopathic Medicine

## 2015-08-13 ENCOUNTER — Ambulatory Visit (INDEPENDENT_AMBULATORY_CARE_PROVIDER_SITE_OTHER): Payer: Medicare Other | Admitting: Osteopathic Medicine

## 2015-08-13 VITALS — BP 138/75 | HR 87 | Ht 65.0 in | Wt 208.0 lb

## 2015-08-13 DIAGNOSIS — I25709 Atherosclerosis of coronary artery bypass graft(s), unspecified, with unspecified angina pectoris: Secondary | ICD-10-CM | POA: Diagnosis not present

## 2015-08-13 DIAGNOSIS — M549 Dorsalgia, unspecified: Secondary | ICD-10-CM

## 2015-08-13 DIAGNOSIS — N952 Postmenopausal atrophic vaginitis: Secondary | ICD-10-CM

## 2015-08-13 DIAGNOSIS — G4700 Insomnia, unspecified: Secondary | ICD-10-CM | POA: Diagnosis not present

## 2015-08-13 DIAGNOSIS — R61 Generalized hyperhidrosis: Secondary | ICD-10-CM

## 2015-08-13 DIAGNOSIS — N390 Urinary tract infection, site not specified: Secondary | ICD-10-CM

## 2015-08-13 DIAGNOSIS — G8929 Other chronic pain: Secondary | ICD-10-CM | POA: Insufficient documentation

## 2015-08-13 DIAGNOSIS — L74519 Primary focal hyperhidrosis, unspecified: Secondary | ICD-10-CM

## 2015-08-13 MED ORDER — SUVOREXANT 10 MG PO TABS: 10.0000 mg | ORAL_TABLET | Freq: Every day | ORAL | 1 refills | Status: DC

## 2015-08-13 MED ORDER — CEPHALEXIN 250 MG PO CAPS: 250.0000 mg | ORAL_CAPSULE | Freq: Every day | ORAL | 6 refills | Status: DC

## 2015-08-13 MED ORDER — ESTRADIOL 0.1 MG/GM VA CREA: 0.5000 | TOPICAL_CREAM | VAGINAL | 12 refills | Status: DC

## 2015-08-13 NOTE — [place] Notes (Signed)
HPI: Carrie Salazar is a 70 y.o. Not Hispanic or Latino female  who presents to [facility] today, 08/13/15,  for chief complaint of:  Chief Complaint  Patient presents with  . Establish Care    Recently moved from [place] and here to establish care.   UTI - recurrent infections over the past year, Greater than 3. Patient had recently been treated in urgent care, urine culture results reviewed from that visit. 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 Previously seen by cardiologist and following q37mos. Having occasional palpitations. W/u by cardiology - Holter monitor.  MSK - Back surgeries, Medtronic device on R. Stable   SKIN - sweating issues, dripping sweat, very bothersome. Going on since the CABG surgery.   INSOMNIA - Off and on for years but has been worse over past 6 months. Patient has tried Ambien and Lunesta before without any relief.    Past medical, surgical, social and family history reviewed: Past Medical History:  Diagnosis Date  . Heart disease   . Hypertension    Past Surgical History:  Procedure Laterality Date  . ABDOMINAL HYSTERECTOMY    . APPENDECTOMY    . BACK SURGERY    . BLADDER SURGERY    . BREAST LUMPECTOMY    . BYPASS AXILLA/BRACHIAL ARTERY     Social History  Substance Use Topics  . Smoking status: Former Research scientist (life sciences)  . Smokeless tobacco: Never Used  . Alcohol use Yes     Comment: 1 q wk   No family history on file.   Current medication list and allergy/intolerance information reviewed:   Current Outpatient Prescriptions  Medication Sig Dispense Refill  . Apixaban (ELIQUIS PO) Take by mouth.    Marland Kitchen aspirin 81 MG tablet Take 81 mg by mouth daily.    . carvedilol (COREG) 12.5 MG tablet Take 12.5 mg by mouth 2 (two) times daily with a meal.    .  cephALEXin (KEFLEX) 500 MG capsule Take 1 capsule (500 mg total) by mouth 2 (two) times daily. For 7 days 14 capsule 0  . DULoxetine (CYMBALTA) 30 MG capsule Take 30 mg by mouth daily.    Marland Kitchen gabapentin (NEURONTIN) 300 MG capsule Take 300 mg by mouth 3 (three) times daily.    Marland Kitchen lactobacillus acidophilus (BACID) TABS tablet Take 2 tablets by mouth 3 (three) times daily.    Marland Kitchen lisinopril (PRINIVIL,ZESTRIL) 10 MG tablet Take 10 mg by mouth daily.    . pravastatin (PRAVACHOL) 20 MG tablet Take 20 mg by mouth daily.    . ranolazine (RANEXA) 500 MG 12 hr tablet Take 500 mg by mouth 2 (two) times daily.     No current facility-administered medications for this visit.    Allergies  Allergen Reactions  . Levaquin [Levofloxacin In D5w] Other (See Comments)    "I get real dizzy and pass out"   . Codeine   . Ivp Dye [Iodinated Diagnostic Agents]   . Percodan [Oxycodone-Aspirin]   . Penicillins Hives      Review of Systems:  Constitutional:  No  fever, no chills, (+) sweats, No recent illness, No unintentional weight changes. No significant fatigue.   HEENT: No  headache, no vision change, no hearing change, No sore throat, No  sinus pressure, (+) allergies  Cardiac: No  chest pain, No  pressure, (+) palpitations, No  Orthopnea  Respiratory:  No  shortness of breath. No  Cough  Gastrointestinal: No  abdominal pain, No  nausea, No  vomiting,  No  blood in stool, No  diarrhea, No  constipation   Musculoskeletal: No new myalgia/arthralgia, (+) chronic joint pain   Genitourinary: No  incontinence, No  abnormal genital bleeding, No abnormal genital discharge  Skin: No  Rash, No other wounds/concerning lesions  Hem/Onc: (+) easy bruising/bleeding states due to "heart meds", No  abnormal lymph node  Endocrine: No cold intolerance,  No heat intolerance. No polyuria/polydipsia/polyphagia   Neurologic: No  weakness, No  dizziness, No  slurred speech/focal weakness/facial droop  Psychiatric: No   concerns with depression, No  concerns with anxiety, (+) sleep problems, No mood problems  Exam:  BP 138/75   Pulse 87   Ht 5\' 5"  (1.651 m)   Wt 208 lb (94.3 kg)   BMI 34.61 kg/m   Constitutional: VS see above. General Appearance: alert, well-developed, well-nourished, NAD  Eyes: Normal lids and conjunctive, non-icteric sclera  Ears, Nose, Mouth, Throat: MMM,  Neck: No masses, trachea midline. No thyroid enlargement.  Respiratory: Normal respiratory effort. no wheeze, no rhonchi, no rales  Cardiovascular: S1/S2 normal, no murmur, no rub/gallop auscultated. RRR. No lower extremity edema  Musculoskeletal: Gait normal.  Neurological: No cranial nerve deficit on limited exam. Motor and sensation intact and symmetric. Cerebellar reflexes intact. Normal balance/coordination. No tremor.   Skin: warm, dry, intact. No rash/ulcer. Marland Kitchen    Psychiatric: Normal judgment/insight. Normal mood and affect. Oriented x3.     ASSESSMENT/PLAN: Please see separate assessment/plan notes, orders placed as below  Carrie Salazar was seen today for establish care.  Diagnoses and all orders for this visit:  Insomnia -     Suvorexant (BELSOMRA) 10 MG TABS; Take 10 mg by mouth at bedtime. For one week. If not effective, can increase to 2 tablets (20 mg total) by mouth at bedtime.  Recurrent UTI -     cephALEXin (KEFLEX) 250 MG capsule; Take 1 capsule (250 mg total) by mouth daily. For UTI prophylaxis -     estradiol (ESTRACE VAGINAL) 0.1 MG/GM vaginal cream; Place [id number] Applicatorfuls vaginally 3 (three) times a week.  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        Visit summary with medication list and  pertinent instructions was printed for patient to review. All questions at time of visit were answered - patient instructed to contact office with any additional concerns. ER/RTC precautions were reviewed with the patient. Follow-up plan: Return in about 6 weeks (around 09/24/2015), or sooner if needed, for INSOMNIA, separate visit for [place] depending on review of records.  Note: Total time spent 45 minutes, greater than 50% of the visit was spent face-to-face counseling and coordinating care for the following: The primary encounter diagnosis was Insomnia. 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

## 2015-08-13 NOTE — Assessment & Plan Note (Signed)
Culture results reviewed, allergies reviewed, initiate prophylactic antibiotics, continue 6 months and then recheck, restart vaginal estrogen as well

## 2015-08-13 NOTE — Patient Instructions (Signed)
We need records from your previous primary care provider, as well as your previous cardiologist. Please let us know if you don't hear back that Dr. Sheppard Coil has reviewed these records sometime in the next 1-2 weeks.  Let's plan to follow-up in the next 4-6 weeks to reevaluate insomnia and UTI issues.   We are starting Belsomra at 10 mg dose, do this for one week, do not increase dose prior to 7 days. If that dose is working, can continue it. After 7 days, if medication isn't effective, can increase to 20 mg/2 tablets.   We are starting a daily medication for prevention of UTI, we are refilling vaginal estrogen. Please let us know if any problems with these other medications.  Let's plan to schedule a separate visit for annual wellness exam sometime in the fall.

## 2015-08-13 NOTE — Assessment & Plan Note (Signed)
Previous trials of Lunesta and Ambien ineffective, initiate Belsomra

## 2015-08-13 NOTE — Assessment & Plan Note (Addendum)
Awaiting records from previous cardiology, plan to establish care locally

## 2015-08-13 NOTE — Assessment & Plan Note (Signed)
Documented problem with anti-anginal medication she is taking, will defer to cardiology alternative medicine

## 2015-08-20 ENCOUNTER — Ambulatory Visit (INDEPENDENT_AMBULATORY_CARE_PROVIDER_SITE_OTHER): Payer: Medicare Other | Admitting: Osteopathic Medicine

## 2015-08-20 ENCOUNTER — Encounter: Payer: Self-pay | Admitting: Osteopathic Medicine

## 2015-08-20 VITALS — BP 109/65 | HR 73 | Ht 65.0 in | Wt 209.0 lb

## 2015-08-20 DIAGNOSIS — G5793 Unspecified mononeuropathy of bilateral lower limbs: Secondary | ICD-10-CM

## 2015-08-20 DIAGNOSIS — I4892 Unspecified atrial flutter: Secondary | ICD-10-CM

## 2015-08-20 DIAGNOSIS — Z8669 Personal history of other diseases of the nervous system and sense organs: Secondary | ICD-10-CM

## 2015-08-20 DIAGNOSIS — G5791 Unspecified mononeuropathy of right lower limb: Secondary | ICD-10-CM | POA: Diagnosis not present

## 2015-08-20 DIAGNOSIS — M549 Dorsalgia, unspecified: Secondary | ICD-10-CM | POA: Diagnosis not present

## 2015-08-20 DIAGNOSIS — G4733 Obstructive sleep apnea (adult) (pediatric): Secondary | ICD-10-CM

## 2015-08-20 DIAGNOSIS — N390 Urinary tract infection, site not specified: Secondary | ICD-10-CM

## 2015-08-20 DIAGNOSIS — G8929 Other chronic pain: Secondary | ICD-10-CM

## 2015-08-20 DIAGNOSIS — G5792 Unspecified mononeuropathy of left lower limb: Secondary | ICD-10-CM

## 2015-08-20 DIAGNOSIS — I25709 Atherosclerosis of coronary artery bypass graft(s), unspecified, with unspecified angina pectoris: Secondary | ICD-10-CM | POA: Diagnosis not present

## 2015-08-20 DIAGNOSIS — N952 Postmenopausal atrophic vaginitis: Secondary | ICD-10-CM

## 2015-08-20 MED ORDER — GABAPENTIN 600 MG PO TABS: ORAL_TABLET | ORAL | 3 refills | Status: DC

## 2015-08-20 MED ORDER — NITROFURANTOIN MONOHYD MACRO 100 MG PO CAPS: 100.0000 mg | ORAL_CAPSULE | Freq: Two times a day (BID) | ORAL | 0 refills | Status: DC

## 2015-08-20 MED ORDER — TRAMADOL HCL 50 MG PO TABS: ORAL_TABLET | ORAL | 0 refills | Status: DC

## 2015-08-20 NOTE — Patient Instructions (Signed)
Need records - let us know if you don't hear that I have reviewed these by end of next week.   You should hear back form pain management about the Medtronic and management of your chronic pain/back problems. Please let us know if you don't hear back about an appointment.   Please review your medication list carefully, we have made a few changes  Tramadol as needed for SEVERE pain. DO NOT TAKE with Oxycodone.   Nitrofurantoin for UTI, stop Cephalexin - we need a urine sample, preferably before you start these antibiotics! Please bring your specimen back to the office or to the lab downstairs ASAP.  Increase Gabapentin, see the list for details.

## 2015-08-20 NOTE — [place] Notes (Signed)
HPI: Carrie Salazar is a 70 y.o. female  who presents to [place] today, 08/20/15,  for chief complaint of:  Chief Complaint  Patient presents with  . Medicare Wellness    Patient initially presented for Medicare wellness exam, will defer this for now since she has multiple medical complaints to address today and we still have received records regarding her previous vaccinations/other preventive care status.  Recurrent UTI: Patient was not able to tolerate the Keflex, stated that after a few day she started to feel itching, no rash, no shortness of breath. Occasionally will still have some urinary frequency issues. Unable to provide urine sample for [place] today.  Chronic back pain and neuropathy: Patient requests referral to pain management for her follow-up of her Medtronic stimulator. She states she has some leftover oxycodone which she is taking intermittently. Complains of significant nerve pain in the legs which is causing her to stay with the right. No saddle anesthesia symptoms.  I did receive previous records from her cardiologist, problem list was updated. Patient reports no chest pain, shortness of breath, dizziness,    Past medical, surgical, social and family history reviewed: Past Medical History:  Diagnosis Date  . Heart attack ([place])   . Heart disease   . Heart disease   . Hx of CABG   . Hyperlipidemia   . Hypertension    Past Surgical History:  Procedure Laterality Date  . ABDOMINAL HYSTERECTOMY    . APPENDECTOMY    . BACK SURGERY    . BLADDER SURGERY    . BREAST LUMPECTOMY    . BYPASS AXILLA/BRACHIAL ARTERY    . LYMPECTOMY  1980  . TONSILLECTOMY     Social History  Substance Use Topics  . Smoking status: Former Research scientist (life sciences)  . Smokeless tobacco: Never Used  . Alcohol use Yes     Comment: 1 q wk   Family History  Problem Relation Age of Onset  . Cancer Mother   . Hyperlipidemia Father   . Hypertension Father   . Stroke  Father   . Heart attack Father   . Hyperlipidemia Maternal Grandmother   . Hypertension Maternal Grandmother   . Stroke Maternal Grandmother      Current medication list and allergy/intolerance information reviewed:   Current Outpatient Prescriptions  Medication Sig Dispense Refill  . Apixaban (ELIQUIS PO) Take by mouth.    Marland Kitchen aspirin 81 MG tablet Take 81 mg by mouth daily.    . carvedilol (COREG) 12.5 MG tablet Take 12.5 mg by mouth 2 (two) times daily with a meal.    . DULoxetine (CYMBALTA) 30 MG capsule TK 1 C PO BID FOR DEPRESSION  0  . estradiol (ESTRACE VAGINAL) 0.1 MG/GM vaginal cream Place [id number] Applicatorfuls vaginally 3 (three) times a week. 42.5 g 12  . gabapentin (NEURONTIN) 300 MG capsule Take 300 mg by mouth 3 (three) times daily.    Marland Kitchen lactobacillus acidophilus (BACID) TABS tablet Take 2 tablets by mouth 3 (three) times daily.    Marland Kitchen lisinopril (PRINIVIL,ZESTRIL) 10 MG tablet Take 10 mg by mouth daily.    . pravastatin (PRAVACHOL) 20 MG tablet Take 20 mg by mouth daily.    . ranolazine (RANEXA) 500 MG 12 hr tablet Take 500 mg by mouth 2 (two) times daily.    . cephALEXin (KEFLEX) 250 MG capsule Take 1 capsule (250 mg total) by mouth daily. For UTI prophylaxis (Patient not taking: Reported on 08/20/2015) 30 capsule 6  .  Suvorexant (BELSOMRA) 10 MG TABS Take 10 mg by mouth at bedtime. For one week. If not effective, can increase to 2 tablets (20 mg total) by mouth at bedtime. (Patient not taking: Reported on 08/20/2015) 30 tablet 1   No current facility-administered medications for this visit.    Allergies  Allergen Reactions  . Levaquin [Levofloxacin In D5w] Other (See Comments)    "I get real dizzy and pass out"   . Codeine   . Ivp Dye [Iodinated Diagnostic Agents]   . Percodan [Oxycodone-Aspirin]   . Penicillins Hives      Review of Systems:  Constitutional:  No  fever, no chills,  HEENT: No  headache, no vision change,  Cardiac: No  chest pain, No   pressure  Respiratory:  No  shortness of breath.   Musculoskeletal: No new myalgia/arthralgia  Genitourinary: No  incontinence, No  abnormal genital bleeding, No abnormal genital discharge  Skin: No  Rash  Neurologic: No  weakness, No  dizziness,  Exam:  BP 109/65   Pulse 73   Ht 5\' 5"  (1.651 m)   Wt 209 lb (94.8 kg)   BMI 34.78 kg/m   Constitutional: VS see above. General Appearance: alert, well-developed, well-nourished, NAD  Ears, Nose, Mouth, Throat: MMM, Normal external inspection ears/nares/mouth/lips/gums.  Neck: No masses, trachea midline.  Respiratory: Normal respiratory effort. no wheeze, no rhonchi, no rales  Cardiovascular: S1/S2 normal, no murmur, no rub/gallop auscultated. RRR. No lower extremity edema.   Musculoskeletal: Gait normal  Skin: warm, dry, intact. No rash/ulcer.   Psychiatric: Normal judgment/insight. Normal mood and affect. Oriented x3.     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 All questions at time of visit were answered - patient instructed to contact office with any  additional concerns. ER/RTC precautions were reviewed with the patient. Follow-up plan: Return in about 4 weeks (around 09/17/2015) for FOLLOW UP ON MEDICATIONS AND PAIN - OR ANNUAL PHYSICAL IF DOING WELL.

## 2015-08-21 DIAGNOSIS — H1045 Other chronic allergic conjunctivitis: Secondary | ICD-10-CM | POA: Diagnosis not present

## 2015-08-21 DIAGNOSIS — H16213 Exposure keratoconjunctivitis, bilateral: Secondary | ICD-10-CM | POA: Diagnosis not present

## 2015-08-21 DIAGNOSIS — H04123 Dry eye syndrome of bilateral lacrimal glands: Secondary | ICD-10-CM | POA: Diagnosis not present

## 2015-08-21 DIAGNOSIS — Z961 Presence of intraocular lens: Secondary | ICD-10-CM | POA: Diagnosis not present

## 2015-08-21 LAB — POCT URINALYSIS DIPSTICK
Bilirubin, UA: NEGATIVE
Blood, UA: NEGATIVE
Glucose, UA: NEGATIVE
KETONES UA: NEGATIVE
LEUKOCYTES UA: NEGATIVE
NITRITE UA: NEGATIVE
PROTEIN UA: NEGATIVE
Spec Grav, UA: >=1.03
Urobilinogen, UA: 0.2
pH, UA: 5

## 2015-08-27 ENCOUNTER — Other Ambulatory Visit: Payer: Self-pay | Admitting: Osteopathic Medicine

## 2015-08-27 DIAGNOSIS — N390 Urinary tract infection, site not specified: Secondary | ICD-10-CM

## 2015-09-01 ENCOUNTER — Telehealth: Payer: Self-pay

## 2015-09-01 DIAGNOSIS — Z9689 Presence of other specified functional implants: Secondary | ICD-10-CM

## 2015-09-01 DIAGNOSIS — G8929 Other chronic pain: Secondary | ICD-10-CM

## 2015-09-01 DIAGNOSIS — Z9889 Other specified postprocedural states: Principal | ICD-10-CM

## 2015-09-01 NOTE — Telephone Encounter (Signed)
Patient called stated that during her OV it was discussed that a referral would be placed for pain Management. Patient is calling to follow up and she request a referral for a pain management doctor. Watson Robarge,CMA

## 2015-09-02 NOTE — Telephone Encounter (Signed)
Order is in, can let patient know. Thanks!

## 2015-09-02 NOTE — Telephone Encounter (Signed)
Patient informed. Rhonda Cunningham,CMA  

## 2015-09-24 ENCOUNTER — Other Ambulatory Visit: Payer: Self-pay | Admitting: Osteopathic Medicine

## 2015-09-24 ENCOUNTER — Ambulatory Visit (INDEPENDENT_AMBULATORY_CARE_PROVIDER_SITE_OTHER): Payer: Medicare Other | Admitting: Osteopathic Medicine

## 2015-09-24 ENCOUNTER — Encounter: Payer: Self-pay | Admitting: Osteopathic Medicine

## 2015-09-24 ENCOUNTER — Encounter (HOSPITAL_BASED_OUTPATIENT_CLINIC_OR_DEPARTMENT_OTHER): Payer: Self-pay

## 2015-09-24 ENCOUNTER — Emergency Department (HOSPITAL_BASED_OUTPATIENT_CLINIC_OR_DEPARTMENT_OTHER)
Admission: EM | Admit: 2015-09-24 | Discharge: 2015-09-24 | Disposition: A | Discharge status: Home / Self Care | Payer: Medicare Other | Attending: Emergency Medicine | Admitting: Emergency Medicine

## 2015-09-24 VITALS — BP 130/56 | HR 66 | Ht 65.0 in | Wt 208.0 lb

## 2015-09-24 DIAGNOSIS — T378X5A Adverse effect of other specified systemic anti-infectives and antiparasitics, initial encounter: Secondary | ICD-10-CM | POA: Diagnosis not present

## 2015-09-24 DIAGNOSIS — I2510 Atherosclerotic heart disease of native coronary artery without angina pectoris: Secondary | ICD-10-CM | POA: Insufficient documentation

## 2015-09-24 DIAGNOSIS — F329 Major depressive disorder, single episode, unspecified: Secondary | ICD-10-CM

## 2015-09-24 DIAGNOSIS — R300 Dysuria: Secondary | ICD-10-CM | POA: Diagnosis not present

## 2015-09-24 DIAGNOSIS — Z951 Presence of aortocoronary bypass graft: Secondary | ICD-10-CM | POA: Insufficient documentation

## 2015-09-24 DIAGNOSIS — G4700 Insomnia, unspecified: Secondary | ICD-10-CM

## 2015-09-24 DIAGNOSIS — T7840XA Allergy, unspecified, initial encounter: Secondary | ICD-10-CM

## 2015-09-24 DIAGNOSIS — N3281 Overactive bladder: Secondary | ICD-10-CM | POA: Diagnosis not present

## 2015-09-24 DIAGNOSIS — I10 Essential (primary) hypertension: Secondary | ICD-10-CM | POA: Diagnosis not present

## 2015-09-24 DIAGNOSIS — I25709 Atherosclerosis of coronary artery bypass graft(s), unspecified, with unspecified angina pectoris: Secondary | ICD-10-CM | POA: Diagnosis not present

## 2015-09-24 DIAGNOSIS — Z87891 Personal history of nicotine dependence: Secondary | ICD-10-CM | POA: Diagnosis not present

## 2015-09-24 DIAGNOSIS — N390 Urinary tract infection, site not specified: Secondary | ICD-10-CM

## 2015-09-24 DIAGNOSIS — M549 Dorsalgia, unspecified: Secondary | ICD-10-CM | POA: Diagnosis not present

## 2015-09-24 DIAGNOSIS — R21 Rash and other nonspecific skin eruption: Secondary | ICD-10-CM | POA: Diagnosis present

## 2015-09-24 DIAGNOSIS — F32A Depression, unspecified: Secondary | ICD-10-CM

## 2015-09-24 DIAGNOSIS — J302 Other seasonal allergic rhinitis: Secondary | ICD-10-CM

## 2015-09-24 MED ORDER — NITROFURANTOIN MACROCRYSTAL 100 MG PO CAPS: 100.0000 mg | ORAL_CAPSULE | Freq: Every day | ORAL | 6 refills | Status: DC

## 2015-09-24 MED ORDER — MIRTAZAPINE 15 MG PO TABS: 7.5000 mg | ORAL_TABLET | Freq: Every day | ORAL | 0 refills | Status: DC

## 2015-09-24 MED ORDER — DIPHENHYDRAMINE HCL 25 MG PO CAPS: 25.0000 mg | ORAL_CAPSULE | Freq: Four times a day (QID) | ORAL | 0 refills | Status: DC | PRN

## 2015-09-24 MED ORDER — FAMOTIDINE IN NACL 20-0.9 MG/50ML-% IV SOLN
20.0000 mg | Freq: Once | INTRAVENOUS | Status: AC
  Administered 2015-09-24: 20 mg via INTRAVENOUS
  Filled 2015-09-24: qty 50

## 2015-09-24 MED ORDER — SODIUM CHLORIDE 0.9 % IV BOLUS (SEPSIS): 250.0000 mL | Freq: Once | INTRAVENOUS | Status: DC

## 2015-09-24 MED ORDER — FAMOTIDINE 20 MG PO TABS: 20.0000 mg | ORAL_TABLET | Freq: Two times a day (BID) | ORAL | 0 refills | Status: DC

## 2015-09-24 MED ORDER — METHYLPREDNISOLONE SODIUM SUCC 125 MG IJ SOLR
125.0000 mg | Freq: Once | INTRAMUSCULAR | Status: AC
  Administered 2015-09-24: 125 mg via INTRAVENOUS
  Filled 2015-09-24: qty 2

## 2015-09-24 MED ORDER — SODIUM CHLORIDE 0.9 % IV SOLN: INTRAVENOUS | Status: DC

## 2015-09-24 MED ORDER — PREDNISONE 10 MG PO TABS: 40.0000 mg | ORAL_TABLET | Freq: Every day | ORAL | 0 refills | Status: DC

## 2015-09-24 MED ORDER — DOXYCYCLINE HYCLATE 100 MG PO CAPS: 100.0000 mg | ORAL_CAPSULE | Freq: Two times a day (BID) | ORAL | 0 refills | Status: DC

## 2015-09-24 NOTE — ED Notes (Signed)
EDP Zackowski notified of severity of allergic reaction

## 2015-09-24 NOTE — ED Notes (Signed)
Pt states she feels much better, redness and itching is better

## 2015-09-24 NOTE — Discharge Instructions (Signed)
Stop the Macrobid. Prescription provided for doxycycline but check with your primary care doctor to see what direction she wants to go with antibiotics for the urinary tract infection.  Take the Benadryl every 6 hours for the next 2 days. Take the prednisone for the next 5 days. Take the Pepcid for the next 7 days.  Return for any new or worse symptoms.

## 2015-09-24 NOTE — ED Notes (Signed)
Family at bedside. 

## 2015-09-24 NOTE — ED Provider Notes (Signed)
[facility] DEPT [facility] Provider Note   CSN: [id number] Arrival date & time: 09/24/15  2053  By signing my name below, I, Carrie Salazar, attest that this documentation has been prepared under the direction and in the presence of Carrie Sorrow, MD . Electronically Signed: Higinio Salazar, Scribe. 09/24/2015. 9:30 PM.  History   Chief Complaint Chief Complaint  Patient presents with  . Allergic Reaction   The history is provided by the patient. No language interpreter was used.   HPI Comments: Carrie Salazar is a 70 y.o. female with PMHx of MI, CAD, HLD and HTN, who presents to the Emergency Department complaining of gradual onset, red, scattered, pruritic rash that began at ~7:30 pm s/p beginning a new medication. Pt reports her rash is the most prevalent around her bilateral knees, chest, back,  posterior neck and head. She notes she visited her PCP this afternoon and was diagnosed with a UTI and prescribed nitrofurantoin. She states her rash appeared  soon after taking this medication; however, she has taken it before with no symptoms. Pt reports she was also prescribed doxycyline 1 month ago for her chronic UTI symptoms with no complications. She states she took 4 benadryl to relieve her symptoms PTA with no relief. She denies lip or tongue swelling and wheezing. Pt reports she is currently taking Carrie Salazar for A-FIb.   PCP: Dr. Sheppard Coil   Past Medical History:  Diagnosis Date  . Heart attack ([place])   . Heart disease   . Heart disease   . Hx of CABG   . Hyperlipidemia   . Hypertension     Patient Active Problem List   Diagnosis Date Noted  . History of obstructive sleep apnea 08/20/2015  . Atrial flutter ([place]) 08/20/2015  . 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 Chronic back pain 08/13/2015  . Recurrent UTI 08/13/2015  . Insomnia 08/13/2015  . Hyperhidrosis 08/13/2015    Past Surgical History:    Procedure Laterality Date  . ABDOMINAL HYSTERECTOMY    . APPENDECTOMY    . BACK SURGERY    . BLADDER SURGERY    . BREAST LUMPECTOMY    . BYPASS AXILLA/BRACHIAL ARTERY    . LYMPECTOMY  1980  . TONSILLECTOMY      OB History    No data available     Home Medications    Prior to Admission medications   Medication Sig Start Date End Date Taking? Authorizing Provider  Apixaban (Carrie Salazar PO) Take by mouth.    Historical Provider, MD  aspirin 81 MG tablet Take 81 mg by mouth daily.    Historical Provider, MD  carvedilol (COREG) 12.5 MG tablet Take 12.5 mg by mouth 2 (two) times daily with a meal.    Historical Provider, MD  diphenhydrAMINE (BENADRYL) 25 mg capsule Take 1 capsule (25 mg total) by mouth every 6 (six) hours as needed. 09/24/15   Carrie Sorrow, MD  doxycycline (VIBRAMYCIN) 100 MG capsule Take 1 capsule (100 mg total) by mouth 2 (two) times daily. 09/24/15   Carrie Sorrow, MD  estradiol (ESTRACE VAGINAL) 0.1 MG/GM vaginal cream Place [id number] Applicatorfuls vaginally 3 (three) times a week. 08/14/15   Carrie Reeve, DO  famotidine (PEPCID) 20 MG tablet Take 1 tablet (20 mg total) by mouth 2 (two) times daily. 09/24/15   Carrie Sorrow, MD  gabapentin (NEURONTIN) 600 MG tablet Take 1 tablet (600 mg total) by mouth in morning and afternoon. In even can  take 2 tablet (1200 mg total). 08/20/15   Carrie Reeve, DO  lactobacillus acidophilus (BACID) TABS tablet Take 2 tablets by mouth 3 (three) times daily.    Historical Provider, MD  lisinopril (PRINIVIL,ZESTRIL) 10 MG tablet Take 10 mg by mouth daily.    Historical Provider, MD  mirtazapine (REMERON) 15 MG tablet TAKE 1/2 TO 1 TABLET(7.5 TO 15 MG) BY MOUTH AT BEDTIME. CALL DOCTOR ALEXANDER IF INSOMNIA NOT IMPROVED IN 2 WEEKS 09/24/15   Carrie Reeve, DO  nitrofurantoin (MACRODANTIN) 100 MG capsule Take 1 capsule (100 mg total) by mouth at bedtime. 09/24/15   Carrie Reeve, DO  pravastatin (PRAVACHOL) 20 MG tablet Take 20 mg  by mouth daily.    Historical Provider, MD  predniSONE (DELTASONE) 10 MG tablet Take 4 tablets (40 mg total) by mouth daily. 09/24/15   Carrie Sorrow, MD  ranolazine (RANEXA) 500 MG 12 hr tablet Take 500 mg by mouth 2 (two) times daily.    Historical Provider, MD    Family History Family History  Problem Relation Age of Onset  . Cancer Mother   . Hyperlipidemia Father   . Hypertension Father   . Stroke Father   . Heart attack Father   . Hyperlipidemia Maternal Grandmother   . Hypertension Maternal Grandmother   . Stroke Maternal Grandmother     Social History Social History  Substance Use Topics  . Smoking status: Former Research scientist (life sciences)  . Smokeless tobacco: Never Used  . Alcohol use Yes     Comment: 1 q wk     Allergies   Levaquin [levofloxacin in d5w]; Codeine; Ivp dye [iodinated diagnostic agents]; Percocet [oxycodone-acetaminophen]; Percodan [oxycodone-aspirin]; and Penicillins  Review of Systems Review of Systems  Constitutional: Negative for chills and fever.  HENT: Negative for rhinorrhea, sore throat and trouble swallowing.   Eyes: Negative for visual disturbance.  Respiratory: Negative for cough, shortness of breath and wheezing.   Cardiovascular: Positive for leg swelling. Negative for chest pain.  Gastrointestinal: Negative for abdominal pain, diarrhea, nausea and vomiting.  Genitourinary: Positive for dysuria. Negative for hematuria.  Musculoskeletal: Positive for back pain.  Skin: Positive for color change and rash.  Neurological: Positive for headaches.  Hematological: Bruises/bleeds easily.   Physical Exam Updated Vital Signs BP 176/72 (BP Location: Left Arm)   Pulse 85   Temp 98.8 F (37.1 C) (Oral)   Resp 20   Ht 5\' 5"  (1.651 m)   Wt 208 lb (94.3 kg)   SpO2 97%   BMI 34.61 kg/m   Physical Exam  Constitutional: She is oriented to person, place, and time.  HENT:  Head: Normocephalic and atraumatic.  Mouth/Throat: Oropharynx is clear and moist.    Moist mucous membranes, no lip or tongue swelling  Redness to the back of her neck and up into posterior part of head   Eyes: EOM are normal. Pupils are equal, round, and reactive to light. No scleral icterus.  Eyes are tracking normally  Cardiovascular: Normal rate and regular rhythm.   Pulmonary/Chest:  Lungs clear to auscultation bilaterally   Abdominal: Soft. Bowel sounds are normal. There is no tenderness.  Musculoskeletal:  Intense redness around knees, proximal leg and distal thigh; blanching present with a component of hives  Neurological: She is alert and oriented to person, place, and time. Coordination normal.  Skin: No rash noted. No erythema. No pallor.   ED Treatments / Results  Labs (all labs ordered are listed, but only abnormal results are displayed) Labs Reviewed - No  data to display  EKG  EKG Interpretation None       Radiology No results found.  Procedures Procedures (including critical care time)  Medications Ordered in ED Medications  0.9 %  sodium chloride infusion (not administered)  sodium chloride 0.9 % bolus 250 mL (not administered)  methylPREDNISolone sodium succinate (SOLU-MEDROL) 125 mg/2 mL injection 125 mg (125 mg Intravenous Given 09/24/15 2142)  famotidine (PEPCID) IVPB 20 mg premix (0 mg Intravenous Stopped 09/24/15 2227)    Initial Impression / Assessment and Salazar / ED Course  I have reviewed the triage vital signs and the nursing notes.  Pertinent labs & imaging results that were available during my care of the patient were reviewed by me and considered in my medical decision making (see chart for details).  Clinical Course  DIAGNOSTIC STUDIES:  Oxygen Saturation is 95% on RA, normal by my interpretation.    COORDINATION OF CARE:  9:27 PM Discussed treatment Salazar with pt at bedside and pt agreed to Salazar.  Patient presented with hives erythema consistent with an acute allergic reaction most likely due to the [place]. Will have  patient stop the Macrobid. Patient did take Benadryl at home. Never had any lip swelling no tongue swelling no wheezing. Patient treated here with Pepcid and Solu-Medrol hives of almost completely resolved. Patient is stable nontoxic and can be discharged home.  Patient will be given a prescription for doxycycline if her primary care doctor wants to go in that direction to treat the urinary tract infection. She'll be continued on Benadryl for 2 days Pepcid for 7 days and prednisone for 5 days. Patient will return for any new or worse symptoms.  I personally performed the services described in this documentation, which was scribed in my presence. The recorded information has been reviewed and is accurate.   Final Clinical Impressions(s) / ED Diagnoses   Final diagnoses:  Allergic reaction, initial encounter    New Prescriptions New Prescriptions   DIPHENHYDRAMINE (BENADRYL) 25 MG CAPSULE    Take 1 capsule (25 mg total) by mouth every 6 (six) hours as needed.   DOXYCYCLINE (VIBRAMYCIN) 100 MG CAPSULE    Take 1 capsule (100 mg total) by mouth 2 (two) times daily.   FAMOTIDINE (PEPCID) 20 MG TABLET    Take 1 tablet (20 mg total) by mouth 2 (two) times daily.   PREDNISONE (DELTASONE) 10 MG TABLET    Take 4 tablets (40 mg total) by mouth daily.     Carrie Sorrow, MD 09/24/15 [phone]

## 2015-09-24 NOTE — ED Triage Notes (Addendum)
Pt c/o scattered hives, itching after starting nitrofurantoin today for UTI-took benadryl x 4 approx 730pm without relief-no resp distress noted-steady gait

## 2015-09-24 NOTE — ED Notes (Signed)
Patient denies pain but itching is very intense rates 9/10 scale

## 2015-09-24 NOTE — Progress Notes (Addendum)
HPI: Carrie Salazar is a 70 y.o. female  who presents to [facility] today, 09/24/15,  for chief complaint of:  Chief Complaint  Patient presents with  . Follow-up    INSOMINIA     BLADDER - Macrobid, took 5 days, this worked for UTI. Was also given estrace Rx and using this as well, hasn't noticed much difference with this. Still requesting daily antibiotic for prophylaxis, she is having multiple UTIs within the past year, though I don't have records available from her previous PCP  ALLERGIES - not on any OTC meds, sinus pressure is an issue for her.   INSOMNIA - Belsomra was not helpful, patient has been on trazodone and amitriptyline before in the past, would liek antidepressant which may also help her w/ insonmia  DEPRESSION - Previously on Fluoxetine, also tried Duloxetine. She stopped these due to sexual side effects. Previously on Amitriptyline and Trazodone.    Past medical, surgical, social and family history reviewed: Past Medical History:  Diagnosis Date  . Heart attack ([place])   . Heart disease   . Heart disease   . Hx of CABG   . Hyperlipidemia   . Hypertension    Past Surgical History:  Procedure Laterality Date  . ABDOMINAL HYSTERECTOMY    . APPENDECTOMY    . BACK SURGERY    . BLADDER SURGERY    . BREAST LUMPECTOMY    . BYPASS AXILLA/BRACHIAL ARTERY    . LYMPECTOMY  1980  . TONSILLECTOMY     Social History  Substance Use Topics  . Smoking status: Former Research scientist (life sciences)  . Smokeless tobacco: Never Used  . Alcohol use Yes     Comment: 1 q wk   Family History  Problem Relation Age of Onset  . Cancer Mother   . Hyperlipidemia Father   . Hypertension Father   . Stroke Father   . Heart attack Father   . Hyperlipidemia Maternal Grandmother   . Hypertension Maternal Grandmother   . Stroke Maternal Grandmother      Current medication list and allergy/intolerance information reviewed:   Current Outpatient Prescriptions   Medication Sig Dispense Refill  . Apixaban (ELIQUIS PO) Take by mouth.    Marland Kitchen aspirin 81 MG tablet Take 81 mg by mouth daily.    . carvedilol (COREG) 12.5 MG tablet Take 12.5 mg by mouth 2 (two) times daily with a meal.    . estradiol (ESTRACE VAGINAL) 0.1 MG/GM vaginal cream Place [id number] Applicatorfuls vaginally 3 (three) times a week. 42.5 g 12  . gabapentin (NEURONTIN) 600 MG tablet Take 1 tablet (600 mg total) by mouth in morning and afternoon. In even can take 2 tablet (1200 mg total). 120 tablet 3  . lactobacillus acidophilus (BACID) TABS tablet Take 2 tablets by mouth 3 (three) times daily.    Marland Kitchen lisinopril (PRINIVIL,ZESTRIL) 10 MG tablet Take 10 mg by mouth daily.    . pravastatin (PRAVACHOL) 20 MG tablet Take 20 mg by mouth daily.    . ranolazine (RANEXA) 500 MG 12 hr tablet Take 500 mg by mouth 2 (two) times daily.     No current facility-administered medications for this visit.    Allergies  Allergen Reactions  . Levaquin [Levofloxacin In D5w] Other (See Comments)    "I get real dizzy and pass out"   . Codeine   . Ivp Dye [Iodinated Diagnostic Agents]   . Percocet [Oxycodone-Acetaminophen]   . Percodan [Oxycodone-Aspirin]   . Penicillins Hives  Review of Systems:  Constitutional:  No  fever, no chills, No recent illness  HEENT: No  headache, no vision change, no hearing change, +sore throat, +sinus pressure  Cardiac: No  chest pain,  Respiratory:  No  shortness of breath.   Gastrointestinal: No  abdominal pain, No  nausea,  Genitourinary: No  incontinence, No  abnormal genital bleeding, No abnormal genital discharge  +frequency  Psychiatric: +concerns with depression, No  concerns with anxiety, +sleep problems, No mood problems  Exam:  BP (!) 130/56   Pulse 66   Ht 5\' 5"  (1.651 m)   Wt 208 lb (94.3 kg)   BMI 34.61 kg/m   Constitutional: VS see above. General Appearance: alert, well-developed, well-nourished, NAD  Ears, Nose, Mouth, Throat: MMM,    Neck: No masses, trachea midline.   Respiratory: Normal respiratory effort.   Psychiatric: Normal judgment/insight. Normal mood and affect. Oriented x3.     ASSESSMENT/PLAN:   Recurrent UTI  Overactive bladder  Insomnia  Depression  Seasonal allergies   Patient Instructions  Plan:  Urine culture today to confirm no infection. If this is negative, can go ahead and fill the Macrobid antibiotics to take daily. If this is positive for unusual infection which would affect antibiotic choice, we will call you. Let's plan to do vaginal exam at next visit, may also want to consider medication for overactive bladder, or urologist referral  For insomnia and depression, new medication was started called mirtazapine. Can try this at half a tablet for a few nights, then increase to whole tablet. If not sleeping better after 1-2 weeks, call me.  For seasonal allergies, try Flonase as directed in the office  Plan to follow-up in 4 weeks to recheck how the medications are doing for you.    Visit summary with medication list and pertinent instructions was printed for patient to review. All questions at time of visit were answered - patient instructed to contact office with any additional concerns. ER/RTC precautions were reviewed with the patient. Follow-up plan: Return in about 4 weeks (around 10/22/2015), or SOONER IF NEEDED, for FOLLOW-UP ON MEDICATIONS.

## 2015-09-24 NOTE — Patient Instructions (Addendum)
Plan:  Urine culture today to confirm no infection. If this is negative, can go ahead and fill the Macrobid antibiotics to take daily. If this is positive for unusual infection which would affect antibiotic choice, we will call you. Let's plan to do vaginal exam at next visit, may also want to consider medication for overactive bladder, or urologist referral  For insomnia and depression, new medication was started called mirtazapine. Can try this at half a tablet for a few nights, then increase to whole tablet. If not sleeping better after 1-2 weeks, call me.  For seasonal allergies, try Flonase as directed in the office  Plan to follow-up in 4 weeks to recheck how the medications are doing for you.

## 2015-09-26 LAB — URINE CULTURE: ORGANISM ID, BACTERIA: NO GROWTH

## 2015-09-28 DIAGNOSIS — M961 Postlaminectomy syndrome, not elsewhere classified: Secondary | ICD-10-CM | POA: Diagnosis not present

## 2015-09-28 DIAGNOSIS — Z79899 Other long term (current) drug therapy: Secondary | ICD-10-CM | POA: Diagnosis not present

## 2015-09-28 DIAGNOSIS — M5136 Other intervertebral disc degeneration, lumbar region: Secondary | ICD-10-CM | POA: Diagnosis not present

## 2015-09-28 DIAGNOSIS — M5416 Radiculopathy, lumbar region: Secondary | ICD-10-CM | POA: Diagnosis not present

## 2015-09-28 DIAGNOSIS — G8929 Other chronic pain: Secondary | ICD-10-CM | POA: Diagnosis not present

## 2015-10-01 ENCOUNTER — Telehealth: Payer: Self-pay | Admitting: Cardiology

## 2015-10-01 NOTE — Telephone Encounter (Signed)
Received records from [facility] for appointment on 10/07/15 with Dr Stanford Breed.  Records given to [facility] (medical records) for Dr Jacalyn Lefevre schedule on 10/07/15. lp

## 2015-10-06 NOTE — Progress Notes (Signed)
HPI: 70 year old female for evaluation of coronary artery disease. CABG 2013; pt had PCI of PDA and PLA following CABG. Cardiac catheterization April 2016 showed severe three-vessel coronary disease, patent LIMA to the LAD, patent saphenous vein graft to the ramus intermedius, patent saphenous vein graft to the PDA with patent stents in the PDA beyond the graft insertion site, normal LV systolic function. Echocardiogram November 2016 showed normal LV function, mild LVH, mild left atrial enlargement and mild right ventricular enlargement. Nuclear study November 2016 showed no ischemia and ejection fraction 85%. Holter monitor January 2017 showed sinus rhythm with possible atrial flutter, PACs, atrial couplets and atrial triplets. Patient has some dyspnea on exertion but no orthopnea, PND, pedal edema or syncope. She has occasional brief chest pain that she takes nitroglycerin for. It is actually improved compared to previous. It sounds as though she's had problems with this intermittently since her bypass. Occasional brief palpitations.   Current Outpatient Prescriptions  Medication Sig Dispense Refill  . Apixaban (ELIQUIS PO) Take 5 mg by mouth 2 (two) times daily.     Marland Kitchen aspirin 81 MG tablet Take 81 mg by mouth daily.    . carvedilol (COREG) 12.5 MG tablet Take 12.5 mg by mouth 2 (two) times daily with a meal.    . diphenhydrAMINE (BENADRYL) 25 mg capsule Take 1 capsule (25 mg total) by mouth every 6 (six) hours as needed. 30 capsule 0  . estradiol (ESTRACE VAGINAL) 0.1 MG/GM vaginal cream Place [id number] Applicatorfuls vaginally 3 (three) times a week. 42.5 g 12  . gabapentin (NEURONTIN) 600 MG tablet Take 1 tablet (600 mg total) by mouth in morning and afternoon. In even can take 2 tablet (1200 mg total). 120 tablet 3  . isosorbide mononitrate (IMDUR) 30 MG 24 hr tablet take one tablet by mouth daily  2  . lactobacillus acidophilus (BACID) TABS tablet Take 2 tablets by mouth 3 (three) times daily.     Marland Kitchen lisinopril (PRINIVIL,ZESTRIL) 10 MG tablet Take 10 mg by mouth 2 (two) times daily.     . mirtazapine (REMERON) 15 MG tablet TAKE 1/2 TO 1 TABLET(7.5 TO 15 MG) BY MOUTH AT BEDTIME. CALL DOCTOR ALEXANDER IF INSOMNIA NOT IMPROVED IN 2 WEEKS 90 tablet 0  . nitroGLYCERIN (NITROSTAT) 0.4 MG SL tablet Place 0.4 mg under the tongue every 5 (five) minutes as needed for chest pain.    . pravastatin (PRAVACHOL) 20 MG tablet Take 20 mg by mouth daily.    . ranolazine (RANEXA) 500 MG 12 hr tablet Take 500 mg by mouth 2 (two) times daily.    . Vitamin D, Ergocalciferol, (DRISDOL) 50000 units CAPS capsule take ONE Capsule by mouth ONCE WEEKLY FOR VITAMIN D REPLACEMENT  2   No current facility-administered medications for this visit.     Allergies  Allergen Reactions  . Nitrofuran Derivatives Itching  . Levaquin [Levofloxacin In D5w] Other (See Comments)    "I get real dizzy and pass out"   . Codeine   . Ivp Dye [Iodinated Diagnostic Agents]   . Percocet [Oxycodone-Acetaminophen]   . Percodan [Oxycodone-Aspirin]   . Penicillins Hives     Past Medical History:  Diagnosis Date  . Heart attack ([place])   . Hx of CABG   . Hyperlipidemia   . Hypertension     Past Surgical History:  Procedure Laterality Date  . ABDOMINAL HYSTERECTOMY    . APPENDECTOMY    . BACK SURGERY    . BLADDER SURGERY    .  BREAST LUMPECTOMY    . CORONARY ARTERY BYPASS GRAFT    . LYMPECTOMY  1980  . TONSILLECTOMY      Social History   Social History  . Marital status: Married    Spouse name: N/A  . Number of children: 2  . Years of education: N/A   Occupational History  . Not on file.   Social History Main Topics  . Smoking status: Former Research scientist (life sciences)  . Smokeless tobacco: Never Used  . Alcohol use Yes     Comment: 1 q wk  . Drug use: No  . Sexual activity: Not on file   Other Topics Concern  . Not on file   Social History Narrative  . No narrative on file    Family History  Problem Relation Age of  Onset  . Cancer Mother   . Hyperlipidemia Father   . Hypertension Father   . Stroke Father   . Heart attack Father   . Hyperlipidemia Maternal Grandmother   . Hypertension Maternal Grandmother   . Stroke Maternal Grandmother     ROS: no fevers or chills, productive cough, hemoptysis, dysphasia, odynophagia, melena, hematochezia, dysuria, hematuria, rash, seizure activity, orthopnea, PND, pedal edema, claudication. Remaining systems are negative.  Physical Exam:   Blood pressure 111/72, pulse 70, height 5\' 5"  (1.651 m), weight 210 lb (95.3 kg).  General:  Well developed/well nourished in NAD Skin warm/dry Patient not depressed No peripheral clubbing Back-normal HEENT-normal/normal eyelids Neck supple/normal carotid upstroke bilaterally; no bruits; no JVD; no thyromegaly chest - CTA/ normal expansion, Previous sternotomy CV - RRR/normal S1 and S2; no murmurs, rubs or gallops;  PMI nondisplaced Abdomen -NT/ND, no HSM, no mass, + bowel sounds, no bruit 2+ femoral pulses, no bruits Ext-no edema, chords, 2+ DP Neuro-grossly nonfocal  ECG - sinus rhythm with nonspecific lateral ST changes.  A/P 1 coronary artery disease status post coronary artery bypass and graft-continue low-dose aspirin and statin.  2 question history of atrial flutter-patient is on apixaban for this. Based on report she had 5 beats of atrial flutter. I will have her records forwarded to [place] in review her rhythm strips. We will decide at that time whether she requires long-term anticoagulation. I will continue for now. Continue beta blocker.  3 hypertension-blood pressure controlled. Continue present medications.  4 hyperlipidemia-continue low-dose Pravachol. She did not tolerate Crestor or Lipitor previously.  5 Chest pain-she apparently has had difficulties with recurrent chest pain since bypass surgery. Last stress test normal. Continue antianginals.   Kirk Ruths, MD

## 2015-10-07 ENCOUNTER — Encounter: Payer: Self-pay | Admitting: Cardiology

## 2015-10-07 ENCOUNTER — Ambulatory Visit (INDEPENDENT_AMBULATORY_CARE_PROVIDER_SITE_OTHER): Payer: Medicare Other | Admitting: Cardiology

## 2015-10-07 VITALS — BP 111/72 | HR 70 | Ht 65.0 in | Wt 210.0 lb

## 2015-10-07 DIAGNOSIS — I10 Essential (primary) hypertension: Secondary | ICD-10-CM

## 2015-10-07 DIAGNOSIS — E785 Hyperlipidemia, unspecified: Secondary | ICD-10-CM

## 2015-10-07 DIAGNOSIS — R072 Precordial pain: Secondary | ICD-10-CM

## 2015-10-07 DIAGNOSIS — I25810 Atherosclerosis of coronary artery bypass graft(s) without angina pectoris: Secondary | ICD-10-CM

## 2015-10-07 NOTE — Patient Instructions (Signed)
Your physician recommends that you schedule a follow-up appointment in: 3 MONTHS WITH DR CRENSHAW  

## 2015-10-15 ENCOUNTER — Ambulatory Visit (INDEPENDENT_AMBULATORY_CARE_PROVIDER_SITE_OTHER): Payer: Medicare Other | Admitting: Osteopathic Medicine

## 2015-10-15 ENCOUNTER — Encounter: Payer: Self-pay | Admitting: Osteopathic Medicine

## 2015-10-15 VITALS — BP 124/75 | HR 70 | Ht 65.0 in | Wt 214.0 lb

## 2015-10-15 DIAGNOSIS — I25810 Atherosclerosis of coronary artery bypass graft(s) without angina pectoris: Secondary | ICD-10-CM

## 2015-10-15 DIAGNOSIS — R3915 Urgency of urination: Secondary | ICD-10-CM | POA: Diagnosis not present

## 2015-10-15 DIAGNOSIS — N8110 Cystocele, unspecified: Secondary | ICD-10-CM | POA: Diagnosis not present

## 2015-10-15 LAB — POCT URINALYSIS DIPSTICK
BILIRUBIN UA: NEGATIVE
Blood, UA: NEGATIVE
GLUCOSE UA: NEGATIVE
KETONES UA: NEGATIVE
LEUKOCYTES UA: NEGATIVE
Nitrite, UA: NEGATIVE
PH UA: 5.5
Protein, UA: NEGATIVE
Spec Grav, UA: <=1.005
Urobilinogen, UA: 0.2

## 2015-10-15 NOTE — Patient Instructions (Signed)
Last time I placed similar referral it went to a regular urologist. Carrie Salazar needsto specifically see a uro-gynecologist. When they call to schedule your appointment, please confirm that they are setting you up with this specialist. Please let me know if any questions or problems, and please return to clinic if anything changes or gets worse.

## 2015-10-15 NOTE — Progress Notes (Signed)
HPI: Carrie Salazar is a 70 y.o. female  who presents to [facility] today, 10/15/15,  for chief complaint of:  Chief Complaint  Patient presents with  . Follow-up    Continued problems with pelvic pressure mostly on the L side, and urinary frequency. Hx pelvic surgery in 1982, "put everything back up there." Hx prolapse in the past.    Past medical, surgical, social and family history reviewed: Past Medical History:  Diagnosis Date  . Heart attack   . Hx of CABG   . Hyperlipidemia   . Hypertension    Past Surgical History:  Procedure Laterality Date  . ABDOMINAL HYSTERECTOMY    . APPENDECTOMY    . BACK SURGERY    . BLADDER SURGERY    . BREAST LUMPECTOMY    . CORONARY ARTERY BYPASS GRAFT    . LYMPECTOMY  1980  . TONSILLECTOMY     Social History  Substance Use Topics  . Smoking status: Former Research scientist (life sciences)  . Smokeless tobacco: Never Used  . Alcohol use Yes     Comment: 1 q wk   Family History  Problem Relation Age of Onset  . Cancer Mother   . Hyperlipidemia Father   . Hypertension Father   . Stroke Father   . Heart attack Father   . Hyperlipidemia Maternal Grandmother   . Hypertension Maternal Grandmother   . Stroke Maternal Grandmother      Current medication list and allergy/intolerance information reviewed:   Current Outpatient Prescriptions  Medication Sig Dispense Refill  . Apixaban (ELIQUIS PO) Take 5 mg by mouth 2 (two) times daily.     Marland Kitchen aspirin 81 MG tablet Take 81 mg by mouth daily.    . carvedilol (COREG) 12.5 MG tablet Take 12.5 mg by mouth 2 (two) times daily with a meal.    . diphenhydrAMINE (BENADRYL) 25 mg capsule Take 1 capsule (25 mg total) by mouth every 6 (six) hours as needed. 30 capsule 0  . estradiol (ESTRACE VAGINAL) 0.1 MG/GM vaginal cream Place [id number] Applicatorfuls vaginally 3 (three) times a week. 42.5 g 12  . gabapentin (NEURONTIN) 600 MG tablet Take 1 tablet (600 mg total) by mouth in morning and  afternoon. In even can take 2 tablet (1200 mg total). 120 tablet 3  . isosorbide mononitrate (IMDUR) 30 MG 24 hr tablet take one tablet by mouth daily  2  . lactobacillus acidophilus (BACID) TABS tablet Take 2 tablets by mouth 3 (three) times daily.    Marland Kitchen lisinopril (PRINIVIL,ZESTRIL) 10 MG tablet Take 10 mg by mouth 2 (two) times daily.     . mirtazapine (REMERON) 15 MG tablet TAKE 1/2 TO 1 TABLET(7.5 TO 15 MG) BY MOUTH AT BEDTIME. CALL DOCTOR Tiena Manansala IF INSOMNIA NOT IMPROVED IN 2 WEEKS 90 tablet 0  . nitroGLYCERIN (NITROSTAT) 0.4 MG SL tablet Place 0.4 mg under the tongue every 5 (five) minutes as needed for chest pain.    . pravastatin (PRAVACHOL) 20 MG tablet Take 20 mg by mouth daily.    . ranolazine (RANEXA) 500 MG 12 hr tablet Take 500 mg by mouth 2 (two) times daily.    . Vitamin D, Ergocalciferol, (DRISDOL) 50000 units CAPS capsule take ONE Capsule by mouth ONCE WEEKLY FOR VITAMIN D REPLACEMENT  2   No current facility-administered medications for this visit.    Allergies  Allergen Reactions  . Nitrofuran Derivatives Itching  . Levaquin [Levofloxacin In D5w] Other (See Comments)    "I get  real dizzy and pass out"   . Codeine   . Ivp Dye [Iodinated Diagnostic Agents]   . Percocet [Oxycodone-Acetaminophen]   . Percodan [Oxycodone-Aspirin]   . Penicillins Hives      Review of Systems:  Constitutional:  No  fever, no chills, No recent illness  Cardiac: No  chest pain  Respiratory:  No  shortness of breath.  Gastrointestinal: No  abdominal pain, No  nausea, No  vomiting,  No  blood in stool, No  diarrhea, No  constipation    Musculoskeletal: No new myalgia/arthralgia  Genitourinary: No  incontinence, No  abnormal genital bleeding, No abnormal genital discharge  Skin: No  Rash   Exam:  BP 124/75   Pulse 70   Ht 5\' 5"  (1.651 m)   Wt 214 lb (97.1 kg)   BMI 35.61 kg/m   Constitutional: VS see above. General Appearance: alert, well-developed, well-nourished,  NAD  Eyes: Normal lids and conjunctive, non-icteric sclera  Ears, Nose, Mouth, Throat: MMM,   Neck: No masses, trachea midline.  Respiratory: Normal respiratory effort.   Cardiovascular:  No lower extremity edema.  Musculoskeletal: Gait normal.   Skin: warm, dry, intact. No rash/ulcer. GYN: No lesions/ulcers to external genitalia,L labia minora a bit larger physicologic, normal urethra, atrophic vaginal mucosa, white discharge, cervix surgically absent, uterus absent, adnexa no masses and nontender. Grade 1 - 2 cystocoele and rectocoele.    Results for orders placed or performed in visit on 10/15/15 (from the past 24 hour(s))  POCT Urinalysis Dipstick     Status: None   Collection Time: 10/15/15  1:40 PM  Result Value Ref Range   Color, UA YELLOW    Clarity, UA CLEAR    Glucose, UA NEGATIVE    Bilirubin, UA NEGATIVE    Ketones, UA NEGATIVE    Spec Grav, UA <=1.005    Blood, UA NEGATIVE    pH, UA 5.5    Protein, UA NEGATIVE    Urobilinogen, UA 0.2    Nitrite, UA NEGATIVE    Leukocytes, UA Negative Negative    ASSESSMENT/PLAN: Given the patient is describing pressure feeling worse with sitting down and standing, minor cystocele on exam but could benefit from consultation with urogynecology regarding surgical intervention versus pessary versus other.   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 Dennis Bast needsto specifically see a uro-gynecologist. When they call to schedule your appointment, please confirm that they are setting you up with this specialist. Please let me know if any questions or problems, and please return to clinic if anything changes or gets worse.     Visit summary with medication list and pertinent instructions was printed for patient to review. All questions at time of visit were answered -  patient instructed to contact office with any additional concerns. ER/RTC precautions were reviewed with the patient. Follow-up plan: Return if symptoms worsen or fail to improve.

## 2015-10-16 DIAGNOSIS — R3915 Urgency of urination: Secondary | ICD-10-CM | POA: Insufficient documentation

## 2015-10-16 DIAGNOSIS — N8110 Cystocele, unspecified: Secondary | ICD-10-CM | POA: Insufficient documentation

## 2015-10-19 ENCOUNTER — Encounter: Payer: Self-pay | Admitting: *Deleted

## 2015-10-19 ENCOUNTER — Emergency Department (INDEPENDENT_AMBULATORY_CARE_PROVIDER_SITE_OTHER)
Admission: EM | Admit: 2015-10-19 | Discharge: 2015-10-19 | Disposition: A | Discharge status: Home / Self Care | Payer: Medicare Other | Source: Home / Self Care | Attending: Family Medicine | Admitting: Family Medicine

## 2015-10-19 DIAGNOSIS — B9789 Other viral agents as the cause of diseases classified elsewhere: Secondary | ICD-10-CM | POA: Diagnosis not present

## 2015-10-19 DIAGNOSIS — J069 Acute upper respiratory infection, unspecified: Secondary | ICD-10-CM | POA: Diagnosis not present

## 2015-10-19 MED ORDER — BENZONATATE 200 MG PO CAPS: 200.0000 mg | ORAL_CAPSULE | Freq: Every day | ORAL | 0 refills | Status: DC

## 2015-10-19 MED ORDER — DOXYCYCLINE HYCLATE 100 MG PO CAPS: 100.0000 mg | ORAL_CAPSULE | Freq: Two times a day (BID) | ORAL | 0 refills | Status: DC

## 2015-10-19 NOTE — ED Provider Notes (Signed)
[facility]    CSN: [id number] Arrival date & time: 10/19/15  1436     History   Chief Complaint Chief Complaint  Patient presents with  . Cough    HPI Carrie Salazar is a 70 y.o. female.   Patient complains of four day history of typical cold-like symptoms developing over several days, including mild sore throat, sinus congestion, headache, fatigue, and cough.  Her cough is non-productive and worse at night.  No pleuritic pain but she feels tightness in her anterior chest. She has had five episodes of peumonia in the past.   The history is provided by the patient.    Past Medical History:  Diagnosis Date  . Heart attack   . Hx of CABG   . Hyperlipidemia   . Hypertension     Patient Active Problem List   Diagnosis Date Noted  . Female cystocele 10/16/2015  . Urinary urgency 10/16/2015  . History of obstructive sleep apnea 08/20/2015  . Atrial flutter ([place]) 08/20/2015  . 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 Chronic back pain 08/13/2015  . Recurrent UTI 08/13/2015  . Insomnia 08/13/2015  . Hyperhidrosis 08/13/2015    Past Surgical History:  Procedure Laterality Date  . ABDOMINAL HYSTERECTOMY    . APPENDECTOMY    . BACK SURGERY    . BLADDER SURGERY    . BREAST LUMPECTOMY    . CORONARY ARTERY BYPASS GRAFT    . LYMPECTOMY  1980  . TONSILLECTOMY      OB History    No data available       Home Medications    Prior to Admission medications   Medication Sig Start Date End Date Taking? Authorizing Provider  Apixaban (ELIQUIS PO) Take 5 mg by mouth 2 (two) times daily.     Historical Provider, MD  aspirin 81 MG tablet Take 81 mg by mouth daily.    Historical Provider, MD  benzonatate (TESSALON) 200 MG capsule Take 1 capsule (200 mg total) by mouth at bedtime. Take as needed for cough 10/19/15   Kandra Nicolas, MD  carvedilol (COREG) 12.5 MG tablet Take 12.5 mg by  mouth 2 (two) times daily with a meal.    Historical Provider, MD  diphenhydrAMINE (BENADRYL) 25 mg capsule Take 1 capsule (25 mg total) by mouth every 6 (six) hours as needed. 09/24/15   [name], MD  doxycycline (VIBRAMYCIN) 100 MG capsule Take 1 capsule (100 mg total) by mouth 2 (two) times daily. Take with food. 10/19/15   Kandra Nicolas, MD  estradiol (ESTRACE VAGINAL) 0.1 MG/GM vaginal cream Place [id number] Applicatorfuls vaginally 3 (three) times a week. 08/14/15   [name], DO  gabapentin (NEURONTIN) 600 MG tablet Take 1 tablet (600 mg total) by mouth in morning and afternoon. In even can take 2 tablet (1200 mg total). 08/20/15   [name], DO  isosorbide mononitrate (IMDUR) 30 MG 24 hr tablet take one tablet by mouth daily 08/16/15   Historical Provider, MD  lactobacillus acidophilus (BACID) TABS tablet Take 2 tablets by mouth 3 (three) times daily.    Historical Provider, MD  lisinopril (PRINIVIL,ZESTRIL) 10 MG tablet Take 10 mg by mouth 2 (two) times daily.     Historical Provider, MD  mirtazapine (REMERON) 15 MG tablet TAKE 1/2 TO 1 TABLET(7.5 TO 15 MG) BY MOUTH AT BEDTIME. [place] IF INSOMNIA NOT IMPROVED IN 2 WEEKS 09/24/15   Lanelle Bal  Alexander, DO  nitroGLYCERIN (NITROSTAT) 0.4 MG SL tablet Place 0.4 mg under the tongue every 5 (five) minutes as needed for chest pain.    Historical Provider, MD  pravastatin (PRAVACHOL) 20 MG tablet Take 20 mg by mouth daily.    Historical Provider, MD  ranolazine (RANEXA) 500 MG 12 hr tablet Take 500 mg by mouth 2 (two) times daily.    Historical Provider, MD  Vitamin D, Ergocalciferol, (DRISDOL) 50000 units CAPS capsule take ONE Capsule by mouth ONCE WEEKLY FOR VITAMIN D REPLACEMENT 07/30/15   Historical Provider, MD    Family History Family History  Problem Relation Age of Onset  . Cancer Mother   . Hyperlipidemia Father   . Hypertension Father   . Stroke Father   . Heart attack Father   . Hyperlipidemia Maternal  Grandmother   . Hypertension Maternal Grandmother   . Stroke Maternal Grandmother     Social History Social History  Substance Use Topics  . Smoking status: Former Research scientist (life sciences)  . Smokeless tobacco: Never Used  . Alcohol use Yes     Comment: 1 q wk     Allergies   Nitrofuran derivatives; Levaquin [levofloxacin in d5w]; Codeine; Ivp dye [iodinated diagnostic agents]; Percocet [oxycodone-acetaminophen]; Percodan [oxycodone-aspirin]; and Penicillins   Review of Systems Review of Systems  + sore throat + cough No pleuritic pain No wheezing + nasal congestion + post-nasal drainage No sinus pain/pressure No itchy/red eyes No earache No hemoptysis No SOB No fever, + chills/sweats No nausea No vomiting No abdominal pain No diarrhea No urinary symptoms No skin rash + fatigue + myalgias + headache Used OTC meds without relief    Physical Exam Triage Vital Signs ED Triage Vitals  Enc Vitals Group     BP 10/19/15 1519 155/74     Pulse Rate 10/19/15 1519 74     Resp 10/19/15 1519 18     Temp 10/19/15 1519 98.2 F (36.8 C)     Temp Source 10/19/15 1519 Oral     SpO2 10/19/15 1519 95 %     Weight 10/19/15 1519 213 lb (96.6 kg)     Height 10/19/15 1519 5\' 5"  (1.651 m)     Head Circumference --      Peak Flow --      Pain Score 10/19/15 1522 0     Pain Loc --      Pain Edu? --      Excl. in [place]? --    No data found.   Updated Vital Signs BP 155/74 (BP Location: Left Arm)   Pulse 74   Temp 98.2 F (36.8 C) (Oral)   Resp 18   Ht 5\' 5"  (1.651 m)   Wt 213 lb (96.6 kg)   SpO2 95%   BMI 35.45 kg/m   Visual Acuity Right Eye Distance:   Left Eye Distance:   Bilateral Distance:    Right Eye Near:   Left Eye Near:    Bilateral Near:     Physical Exam Nursing notes and Vital Signs reviewed. Appearance:  Patient appears stated age, and in no acute distress Eyes:  Pupils are equal, round, and reactive to light and accomodation.  Extraocular movement is intact.   Conjunctivae are not inflamed  Ears:  Canals normal.  Tympanic membranes normal.  Nose:  Mildly congested turbinates.  No sinus tenderness.    Pharynx:  Normal Neck:  Supple.  Tender enlarged posterior/lateral nodes are palpated bilaterally  Lungs:  Clear to auscultation.  Breath sounds are equal.  Moving air well. Chest:  Distinct tenderness to palpation over the mid-sternum.  Heart:  Regular rate and rhythm without murmurs, rubs, or gallops.  Abdomen:  Nontender without masses or hepatosplenomegaly.  Bowel sounds are present.  No CVA or flank tenderness.  Extremities:  No edema.  Skin:  No rash present.    UC Treatments / Results  Labs (all labs ordered are listed, but only abnormal results are displayed) Labs Reviewed - No data to display  EKG  EKG Interpretation None       Radiology No results found.  Procedures Procedures (including critical care time)  Medications Ordered in UC Medications - No data to display   Initial Impression / Assessment and Plan / UC Course  I have reviewed the triage vital signs and the nursing notes.  Pertinent labs & imaging results that were available during my care of the patient were reviewed by me and considered in my medical decision making (see chart for details).  Clinical Course  Because of her significant past history of pneumonia (five episodes), will begin doxycycline 100mg  BID.  Prescription written for Benzonatate [facility]) to take at bedtime for night-time cough.  Take plain guaifenesin (1200mg  extended release tabs such as Mucinex) twice daily, with plenty of water, for cough and congestion.  Get adequate rest.   May use Afrin nasal spray (or generic oxymetazoline) twice daily for about 5 days and then discontinue.  Also recommend using saline nasal spray several times daily and saline nasal irrigation (AYR is a common brand).  Use Flonase nasal spray each morning after using Afrin nasal spray and saline nasal irrigation. Try  warm salt water gargles for sore throat.  Stop all antihistamines for now, and other non-prescription cough/cold preparations.  Follow-up with family doctor if not improving about10 days.      Final Clinical Impressions(s) / UC Diagnoses   Final diagnoses:  Viral URI with cough    New Prescriptions New Prescriptions   BENZONATATE (TESSALON) 200 MG CAPSULE    Take 1 capsule (200 mg total) by mouth at bedtime. Take as needed for cough   DOXYCYCLINE (VIBRAMYCIN) 100 MG CAPSULE    Take 1 capsule (100 mg total) by mouth 2 (two) times daily. Take with food.     Kandra Nicolas, MD 10/19/15 [phone]

## 2015-10-19 NOTE — Discharge Instructions (Signed)
Take plain guaifenesin (1200mg extended release tabs such as Mucinex) twice daily, with plenty of water, for cough and congestion.  Get adequate rest.   °May use Afrin nasal spray (or generic oxymetazoline) twice daily for about 5 days and then discontinue.  Also recommend using saline nasal spray several times daily and saline nasal irrigation (AYR is a common brand).  Use Flonase nasal spray each morning after using Afrin nasal spray and saline nasal irrigation. °Try warm salt water gargles for sore throat.  °Stop all antihistamines for now, and other non-prescription cough/cold preparations. °  °Follow-up with family doctor if not improving about10 days.  °

## 2015-10-19 NOTE — ED Triage Notes (Signed)
Pt c/o non producitve cough and chest tightness x 4 days. Denies fever. Took tylenol and cough syrup this morning with some relief.

## 2015-10-22 ENCOUNTER — Ambulatory Visit: Payer: Medicare Other | Admitting: Osteopathic Medicine

## 2015-10-27 ENCOUNTER — Telehealth: Payer: Self-pay

## 2015-10-27 DIAGNOSIS — M961 Postlaminectomy syndrome, not elsewhere classified: Secondary | ICD-10-CM | POA: Diagnosis not present

## 2015-10-27 DIAGNOSIS — G894 Chronic pain syndrome: Secondary | ICD-10-CM | POA: Diagnosis not present

## 2015-10-27 DIAGNOSIS — M25531 Pain in right wrist: Secondary | ICD-10-CM | POA: Diagnosis not present

## 2015-10-27 DIAGNOSIS — F329 Major depressive disorder, single episode, unspecified: Secondary | ICD-10-CM | POA: Diagnosis not present

## 2015-10-27 DIAGNOSIS — G4700 Insomnia, unspecified: Secondary | ICD-10-CM

## 2015-10-27 DIAGNOSIS — M25511 Pain in right shoulder: Secondary | ICD-10-CM | POA: Diagnosis not present

## 2015-10-27 DIAGNOSIS — M25551 Pain in right hip: Secondary | ICD-10-CM | POA: Diagnosis not present

## 2015-10-27 DIAGNOSIS — F3341 Major depressive disorder, recurrent, in partial remission: Secondary | ICD-10-CM

## 2015-10-27 NOTE — Telephone Encounter (Signed)
Pt reports that Remeron isn't helping her sleep.  She stated that she even tried taking 2 tablets and that still didn't help. She is asking for something different. Please advise.

## 2015-10-29 ENCOUNTER — Telehealth: Payer: Self-pay | Admitting: Cardiology

## 2015-10-29 MED ORDER — MIRTAZAPINE 45 MG PO TABS: 45.0000 mg | ORAL_TABLET | Freq: Every day | ORAL | 1 refills | Status: DC

## 2015-10-29 NOTE — Telephone Encounter (Signed)
Left vm for pt to return call to clinic  

## 2015-10-29 NOTE — Telephone Encounter (Signed)
New message       *STAT* If patient is at the pharmacy, call can be transferred to refill team.   1. Which medications need to be refilled? (please list name of each medication and dose if known) pravastatin 20mg , isosorbide mononitrate 30mg   2. Which pharmacy/location (including street and city if local pharmacy) is medication to be sent to? [facility] at [address]  3. Do they need a 30 day or 90 day supply? 90mg 

## 2015-10-29 NOTE — Telephone Encounter (Signed)
I sent maximum dose of Remeron in to her pharmacy which is 45 mg. At her last visit when we discussed insomnia, I did ask her to call me if the medicine wasn't helping but was also instructed to schedule an appointment to talk about it further if medications weren't of any benefit, since we are also addressing depression issues as well with this medicine. If the higher dose of the Remeron isn't helpful over the next 1-2 weeks, would recommend she schedule a visit to talk about insomnia and depression follow-up in detail. Please make sure she is following instructions for sleep hygiene/behavioral modifications to help with sleep. I can mail her some information on this if she would like.

## 2015-10-30 MED ORDER — PRAVASTATIN SODIUM 20 MG PO TABS: 20.0000 mg | ORAL_TABLET | Freq: Every day | ORAL | 3 refills | Status: DC

## 2015-10-30 MED ORDER — ISOSORBIDE MONONITRATE ER 30 MG PO TB24: 30.0000 mg | ORAL_TABLET | Freq: Every day | ORAL | 3 refills | Status: DC

## 2015-10-30 NOTE — Telephone Encounter (Signed)
Refill sent to the pharmacy electronically.  

## 2015-11-04 ENCOUNTER — Encounter: Payer: Self-pay | Admitting: Cardiology

## 2015-11-04 ENCOUNTER — Ambulatory Visit (INDEPENDENT_AMBULATORY_CARE_PROVIDER_SITE_OTHER): Payer: Medicare Other | Admitting: Cardiology

## 2015-11-04 VITALS — BP 147/81 | HR 66 | Ht 65.0 in | Wt 210.8 lb

## 2015-11-04 DIAGNOSIS — I25810 Atherosclerosis of coronary artery bypass graft(s) without angina pectoris: Secondary | ICD-10-CM

## 2015-11-04 DIAGNOSIS — I10 Essential (primary) hypertension: Secondary | ICD-10-CM

## 2015-11-04 DIAGNOSIS — R072 Precordial pain: Secondary | ICD-10-CM | POA: Diagnosis not present

## 2015-11-04 DIAGNOSIS — E7800 Pure hypercholesterolemia, unspecified: Secondary | ICD-10-CM

## 2015-11-04 DIAGNOSIS — I2510 Atherosclerotic heart disease of native coronary artery without angina pectoris: Secondary | ICD-10-CM

## 2015-11-04 MED ORDER — ISOSORBIDE MONONITRATE ER 60 MG PO TB24: 60.0000 mg | ORAL_TABLET | Freq: Every day | ORAL | 3 refills | Status: DC

## 2015-11-04 NOTE — Progress Notes (Signed)
HPI: 70 year old female for evaluation of coronary artery disease. CABG 2013; pt had PCI of PDA and PLA following CABG. Cardiac catheterization April 2016 showed severe three-vessel coronary disease, patent LIMA to the LAD, patent saphenous vein graft to the ramus intermedius, patent saphenous vein graft to the PDA with patent stents in the PDA beyond the graft insertion site, normal LV systolic function. Echocardiogram November 2016 showed normal LV function, mild LVH, mild left atrial enlargement and mild right ventricular enlargement. Nuclear study November 2016 showed no ischemia and ejection fraction 85%. Holter monitor January 2017 showed sinus rhythm with possible atrial flutter, PACs, atrial couplets and atrial triplets. Patient presented with her husband and they and asked to be seen as well for chest pain. She has had chest pain for the last 5 days. It is under her left breast. It does not radiate. It increases with lying on her left side and improves with lying on her right side. It never completely resolves. She notes some increased dyspnea on exertion but no orthopnea, PND or increased pedal edema.  Current Outpatient Prescriptions  Medication Sig Dispense Refill  . Apixaban (ELIQUIS PO) Take 5 mg by mouth 2 (two) times daily.     Marland Kitchen aspirin 81 MG tablet Take 81 mg by mouth daily.    . carvedilol (COREG) 12.5 MG tablet Take 12.5 mg by mouth 2 (two) times daily with a meal.    . diphenhydrAMINE (BENADRYL) 25 mg capsule Take 1 capsule (25 mg total) by mouth every 6 (six) hours as needed. 30 capsule 0  . estradiol (ESTRACE VAGINAL) 0.1 MG/GM vaginal cream Place [id number] Applicatorfuls vaginally 3 (three) times a week. 42.5 g 12  . gabapentin (NEURONTIN) 600 MG tablet Take 1 tablet (600 mg total) by mouth in morning and afternoon. In even can take 2 tablet (1200 mg total). 120 tablet 3  . isosorbide mononitrate (IMDUR) 30 MG 24 hr tablet Take 1 tablet (30 mg total) by mouth daily. 90 tablet  3  . lactobacillus acidophilus (BACID) TABS tablet Take 2 tablets by mouth 3 (three) times daily.    Marland Kitchen lisinopril (PRINIVIL,ZESTRIL) 10 MG tablet Take 10 mg by mouth 2 (two) times daily.     . nitroGLYCERIN (NITROSTAT) 0.4 MG SL tablet Place 0.4 mg under the tongue every 5 (five) minutes as needed for chest pain.    . pravastatin (PRAVACHOL) 20 MG tablet Take 1 tablet (20 mg total) by mouth daily. 90 tablet 3  . ranolazine (RANEXA) 500 MG 12 hr tablet Take 500 mg by mouth 2 (two) times daily.    . Vitamin D, Ergocalciferol, (DRISDOL) 50000 units CAPS capsule take ONE Capsule by mouth ONCE WEEKLY FOR VITAMIN D REPLACEMENT  2   No current facility-administered medications for this visit.      Past Medical History:  Diagnosis Date  . Heart attack   . Hx of CABG   . Hyperlipidemia   . Hypertension     Past Surgical History:  Procedure Laterality Date  . ABDOMINAL HYSTERECTOMY    . APPENDECTOMY    . BACK SURGERY    . BLADDER SURGERY    . BREAST LUMPECTOMY    . CORONARY ARTERY BYPASS GRAFT    . LYMPECTOMY  1980  . TONSILLECTOMY      Social History   Social History  . Marital status: Married    Spouse name: N/A  . Number of children: 2  . Years of education: N/A  Occupational History  . Not on file.   Social History Main Topics  . Smoking status: Former Research scientist (life sciences)  . Smokeless tobacco: Never Used  . Alcohol use Yes     Comment: 1 q wk  . Drug use: No  . Sexual activity: Not on file   Other Topics Concern  . Not on file   Social History Narrative  . No narrative on file    Family History  Problem Relation Age of Onset  . Cancer Mother   . Hyperlipidemia Father   . Hypertension Father   . Stroke Father   . Heart attack Father   . Hyperlipidemia Maternal Grandmother   . Hypertension Maternal Grandmother   . Stroke Maternal Grandmother     ROS: no fevers or chills, productive cough, hemoptysis, dysphasia, odynophagia, melena, hematochezia, dysuria, hematuria,  rash, seizure activity, orthopnea, PND, pedal edema, claudication. Remaining systems are negative.  Physical Exam: Well-developed well-nourished in no acute distress.  Skin is warm and dry.  HEENT is normal.  Neck is supple.  Chest is clear to auscultation with normal expansion.  Cardiovascular exam is regular rate and rhythm.  Abdominal exam nontender or distended. No masses palpated. Extremities show no edema. neuro grossly intact  ECG-normal sinus rhythm, lateral T-wave inversion. Slight increase compared to previous.   A/P  1 coronary artery disease status post coronary artery bypass graft-continue aspirin and statin.  2 Hypertension- blood pressure is mildly elevated. I am increasing imdur to 60 mg daily.  3 hyperlipidemia-continue low-dose Pravachol. She has not tolerated Crestor or Lipitor in the past.  4 Question history of atrial flutter-we are awaiting previous rhythm strips as there was 5 beats of atrial flutter by report. We will make a decision concerning continuing long-term anticoagulation once we have those strips to review. Continue for now. Continue beta blocker.  5 chest pain- Symptoms are extremely atypical. She is noted to have some lateral T-wave inversion but in reviewing previous ECGs there is been some evidence of this in the past. Her pain increases with lying on her left side. I will plan a Lexiscan nuclear study for risk stratification. Increase nitrates as described above. She has had some difficulties with recurrent chest pain since her bypass surgery.  Kirk Ruths, MD

## 2015-11-04 NOTE — Patient Instructions (Signed)
Medication Instructions:   INCREASE ISOSORBIDE TO 60 MG ONCE DAILY= 2 OF THE 30 MG TABLETS ONCE DAILY  Testing/Procedures:  Your physician has requested that you have a lexiscan myoview. For further information please visit [email or web address]. Please follow instruction sheet, as given.    Follow-Up:  Your physician recommends that you schedule a follow-up appointment AS SCHEDULED

## 2015-11-05 DIAGNOSIS — M25519 Pain in unspecified shoulder: Secondary | ICD-10-CM | POA: Diagnosis not present

## 2015-11-05 DIAGNOSIS — M19049 Primary osteoarthritis, unspecified hand: Secondary | ICD-10-CM | POA: Diagnosis not present

## 2015-11-10 ENCOUNTER — Telehealth (HOSPITAL_COMMUNITY): Payer: Self-pay

## 2015-11-10 NOTE — Telephone Encounter (Signed)
Encounter complete. 

## 2015-11-11 ENCOUNTER — Telehealth: Payer: Self-pay | Admitting: Cardiology

## 2015-11-11 MED ORDER — APIXABAN 5 MG PO TABS: 5.0000 mg | ORAL_TABLET | Freq: Two times a day (BID) | ORAL | 1 refills | Status: DC

## 2015-11-11 NOTE — Telephone Encounter (Signed)
New Message   *STAT* If patient is at the pharmacy, call can be transferred to refill team.   1. Which medications need to be refilled? (please list name of each medication and dose if known) Eliquis 5mg    2. Which pharmacy/location (including street and city if local pharmacy) is medication to be sent to? [facility]   3. Do they need a 30 day or 90 day supply? 90mg 

## 2015-11-12 ENCOUNTER — Ambulatory Visit (HOSPITAL_COMMUNITY)
Admission: RE | Admit: 2015-11-12 | Discharge: 2015-11-12 | Disposition: A | Discharge status: Home / Self Care | Payer: Medicare Other | Source: Ambulatory Visit | Attending: Cardiology | Admitting: Cardiology

## 2015-11-12 ENCOUNTER — Telehealth: Payer: Self-pay | Admitting: *Deleted

## 2015-11-12 DIAGNOSIS — Z6835 Body mass index (BMI) 35.0-35.9, adult: Secondary | ICD-10-CM | POA: Diagnosis not present

## 2015-11-12 DIAGNOSIS — R42 Dizziness and giddiness: Secondary | ICD-10-CM | POA: Insufficient documentation

## 2015-11-12 DIAGNOSIS — R0609 Other forms of dyspnea: Secondary | ICD-10-CM | POA: Insufficient documentation

## 2015-11-12 DIAGNOSIS — E669 Obesity, unspecified: Secondary | ICD-10-CM | POA: Insufficient documentation

## 2015-11-12 DIAGNOSIS — I10 Essential (primary) hypertension: Secondary | ICD-10-CM | POA: Insufficient documentation

## 2015-11-12 DIAGNOSIS — R5383 Other fatigue: Secondary | ICD-10-CM | POA: Insufficient documentation

## 2015-11-12 DIAGNOSIS — R072 Precordial pain: Secondary | ICD-10-CM

## 2015-11-12 DIAGNOSIS — Z951 Presence of aortocoronary bypass graft: Secondary | ICD-10-CM | POA: Diagnosis not present

## 2015-11-12 DIAGNOSIS — Z8249 Family history of ischemic heart disease and other diseases of the circulatory system: Secondary | ICD-10-CM | POA: Diagnosis not present

## 2015-11-12 DIAGNOSIS — I2510 Atherosclerotic heart disease of native coronary artery without angina pectoris: Secondary | ICD-10-CM | POA: Diagnosis not present

## 2015-11-12 DIAGNOSIS — Z87891 Personal history of nicotine dependence: Secondary | ICD-10-CM | POA: Insufficient documentation

## 2015-11-12 DIAGNOSIS — I252 Old myocardial infarction: Secondary | ICD-10-CM | POA: Diagnosis not present

## 2015-11-12 DIAGNOSIS — G4733 Obstructive sleep apnea (adult) (pediatric): Secondary | ICD-10-CM | POA: Diagnosis not present

## 2015-11-12 LAB — MYOCARDIAL PERFUSION IMAGING
CHL CUP NUCLEAR SDS: 4
CHL CUP RESTING HR STRESS: 68 {beats}/min
CSEPPHR: 87 {beats}/min
LV sys vol: 22 mL
LVDIAVOL: 65 mL (ref 46–106)
SRS: 2
SSS: 6
TID: 1.24

## 2015-11-12 MED ORDER — TECHNETIUM TC 99M TETROFOSMIN IV KIT
30.8000 | PACK | Freq: Once | INTRAVENOUS | Status: AC | PRN
  Administered 2015-11-12: 30.8 via INTRAVENOUS
  Filled 2015-11-12: qty 31

## 2015-11-12 MED ORDER — TECHNETIUM TC 99M TETROFOSMIN IV KIT
9.8000 | PACK | Freq: Once | INTRAVENOUS | Status: AC | PRN
  Administered 2015-11-12: 9.8 via INTRAVENOUS
  Filled 2015-11-12: qty 10

## 2015-11-12 MED ORDER — REGADENOSON 0.4 MG/5ML IV SOLN
0.4000 mg | Freq: Once | INTRAVENOUS | Status: AC
  Administered 2015-11-12: 0.4 mg via INTRAVENOUS
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
## 2015-11-16 ENCOUNTER — Telehealth: Payer: Self-pay | Admitting: *Deleted

## 2015-11-16 NOTE — Telephone Encounter (Signed)
holter monitor strips from [facility] reviewed by dr Stanford Breed. Probable sinus with PAT. Per dr Stanford Breed pt to stop eliquis. Left message for pt to call

## 2015-11-16 NOTE — Telephone Encounter (Signed)
Spoke with pt, Aware of dr crenshaw's recommendations.  °

## 2015-11-24 DIAGNOSIS — N390 Urinary tract infection, site not specified: Secondary | ICD-10-CM | POA: Diagnosis not present

## 2015-11-24 DIAGNOSIS — N8110 Cystocele, unspecified: Secondary | ICD-10-CM | POA: Diagnosis not present

## 2015-12-09 ENCOUNTER — Encounter: Payer: Self-pay | Admitting: Osteopathic Medicine

## 2015-12-09 ENCOUNTER — Ambulatory Visit (INDEPENDENT_AMBULATORY_CARE_PROVIDER_SITE_OTHER): Payer: Medicare Other | Admitting: Osteopathic Medicine

## 2015-12-09 ENCOUNTER — Other Ambulatory Visit: Payer: Self-pay | Admitting: Osteopathic Medicine

## 2015-12-09 VITALS — BP 124/51 | HR 79 | Ht 65.0 in | Wt 212.0 lb

## 2015-12-09 DIAGNOSIS — G4700 Insomnia, unspecified: Secondary | ICD-10-CM

## 2015-12-09 DIAGNOSIS — F411 Generalized anxiety disorder: Secondary | ICD-10-CM

## 2015-12-09 DIAGNOSIS — F3341 Major depressive disorder, recurrent, in partial remission: Secondary | ICD-10-CM

## 2015-12-09 DIAGNOSIS — I25810 Atherosclerosis of coronary artery bypass graft(s) without angina pectoris: Secondary | ICD-10-CM | POA: Diagnosis not present

## 2015-12-09 DIAGNOSIS — F331 Major depressive disorder, recurrent, moderate: Secondary | ICD-10-CM | POA: Diagnosis not present

## 2015-12-09 MED ORDER — ZOLPIDEM TARTRATE 5 MG PO TABS: 2.5000 mg | ORAL_TABLET | Freq: Every evening | ORAL | 0 refills | Status: DC | PRN

## 2015-12-09 MED ORDER — FLUOXETINE HCL 40 MG PO CAPS: 40.0000 mg | ORAL_CAPSULE | Freq: Every day | ORAL | 1 refills | Status: DC

## 2015-12-09 NOTE — Progress Notes (Signed)
HPI: Carrie Salazar is a 70 y.o. female  who presents to [place] today, 12/09/15,  for chief complaint of:  Chief Complaint  Patient presents with  . Depression  . Insomnia   Reports worsening depression. Has started back taking fluoxetine twice prescribed for her by another physician earlier this year. Has been on this for a few days, would like to talk about prescription from me/continuing this medication/possible higher dose. Would also like something for sleep. Previously from me has been on Cymbalta, as well as Remeron to help with sleep but side effects with these medications were problem for her. Had done well previously on fluoxetine in the past.  Depression screen [facility] 2/9 12/09/2015 08/20/2015  Decreased Interest 3 0  Down, Depressed, Hopeless 3 1  PHQ - 2 Score 6 1  Altered sleeping 3 0  Tired, decreased energy 3 0  Change in appetite 2 0  Feeling bad or failure about yourself  0 0  Trouble concentrating 0 0  Moving slowly or fidgety/restless 0 0  Suicidal thoughts 0 0  PHQ-9 Score 14 1   GAD 7 : Generalized Anxiety Score 12/09/2015  Nervous, Anxious, on Edge 3  Control/stop worrying 2  Worry too much - different things 2  Trouble relaxing 2  Restless 1  Easily annoyed or irritable 1  Afraid - awful might happen 0  Total GAD 7 Score 11  Anxiety Difficulty Very difficult     Past medical, surgical, social and family history reviewed: Patient Active Problem List   Diagnosis Date Noted  . Female cystocele 10/16/2015  . Urinary urgency 10/16/2015  . History of obstructive sleep apnea 08/20/2015  . Atrial flutter (Rosa Sanchez) 08/20/2015  . 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 Chronic back pain 08/13/2015  . Recurrent UTI 08/13/2015  . Insomnia 08/13/2015  . Hyperhidrosis 08/13/2015   Past Surgical History:  Procedure Laterality Date  . ABDOMINAL  HYSTERECTOMY    . APPENDECTOMY    . BACK SURGERY    . BLADDER SURGERY    . BREAST LUMPECTOMY    . CORONARY ARTERY BYPASS GRAFT    . LYMPECTOMY  1980  . TONSILLECTOMY     Social History  Substance Use Topics  . Smoking status: Former Research scientist (life sciences)  . Smokeless tobacco: Never Used  . Alcohol use Yes     Comment: 1 q wk   Family History  Problem Relation Age of Onset  . Cancer Mother   . Hyperlipidemia Father   . Hypertension Father   . Stroke Father   . Heart attack Father   . Hyperlipidemia Maternal Grandmother   . Hypertension Maternal Grandmother   . Stroke Maternal Grandmother      Current medication list and allergy/intolerance information reviewed:   Current Outpatient Prescriptions on File Prior to Visit  Medication Sig Dispense Refill  . aspirin 81 MG tablet Take 81 mg by mouth daily.    . carvedilol (COREG) 12.5 MG tablet Take 12.5 mg by mouth 2 (two) times daily with a meal.    . diphenhydrAMINE (BENADRYL) 25 mg capsule Take 1 capsule (25 mg total) by mouth every 6 (six) hours as needed. 30 capsule 0  . estradiol (ESTRACE VAGINAL) 0.1 MG/GM vaginal cream Place [id number] Applicatorfuls vaginally 3 (three) times a week. 42.5 g 12  . gabapentin (NEURONTIN) 600 MG tablet Take 1 tablet (600 mg total) by mouth in morning and afternoon.  In even can take 2 tablet (1200 mg total). 120 tablet 3  . isosorbide mononitrate (IMDUR) 60 MG 24 hr tablet Take 1 tablet (60 mg total) by mouth daily. 90 tablet 3  . lactobacillus acidophilus (BACID) TABS tablet Take 2 tablets by mouth 3 (three) times daily.    Marland Kitchen lisinopril (PRINIVIL,ZESTRIL) 10 MG tablet Take 10 mg by mouth 2 (two) times daily.     . nitroGLYCERIN (NITROSTAT) 0.4 MG SL tablet Place 0.4 mg under the tongue every 5 (five) minutes as needed for chest pain.    . pravastatin (PRAVACHOL) 20 MG tablet Take 1 tablet (20 mg total) by mouth daily. 90 tablet 3  . ranolazine (RANEXA) 500 MG 12 hr tablet Take 500 mg by mouth 2 (two) times daily.     . Vitamin D, Ergocalciferol, (DRISDOL) 50000 units CAPS capsule take ONE Capsule by mouth ONCE WEEKLY FOR VITAMIN D REPLACEMENT  2   No current facility-administered medications on file prior to visit.    Allergies  Allergen Reactions  . Nitrofuran Derivatives Itching  . Levaquin [Levofloxacin In D5w] Other (See Comments)    "I get real dizzy and pass out"   . Codeine   . Dilaudid [Hydromorphone]   . Ivp Dye [Iodinated Diagnostic Agents]   . Percocet [Oxycodone-Acetaminophen]   . Percodan [Oxycodone-Aspirin]   . Sulfa Antibiotics Hives  . Penicillins Hives      Review of Systems:  Constitutional: No recent illness  Cardiac: No  chest pain  Respiratory:  No  shortness of breath.  Neurologic: No  weakness, No  Dizziness  Psychiatric: +concerns with depression, +concerns with anxiety  Exam:  BP (!) 124/51   Pulse 79   Ht 5\' 5"  (1.651 m)   Wt 212 lb (96.2 kg)   BMI 35.28 kg/m   Constitutional: VS see above. General Appearance: alert, well-developed, well-nourished, NAD  Eyes: Normal lids and conjunctive, non-icteric sclera  Ears, Nose, Mouth, Throat: MMM, Normal external inspection ears/nares/mouth/lips/gums.  Neck: No masses, trachea midline.   Respiratory: Normal respiratory effort. no wheeze, no rhonchi, no rales  Cardiovascular: S1/S2 normal, no murmur, no rub/gallop auscultated. RRR.   Musculoskeletal: Gait normal. Symmetric and independent movement of all extremities  Neurological: Normal balance/coordination. No tremor.  Skin: warm, dry, intact.   Psychiatric: Normal judgment/insight. Normal mood and affect. Oriented x3. No SI/HI. No thought disorder    ASSESSMENT/PLAN:   Moderate episode of recurrent major depressive disorder (HCC) - Plan: FLUoxetine (PROZAC) 40 MG capsule  Insomnia, unspecified type - Plan: zolpidem (AMBIEN) 5 MG tablet  Anxiety state    Patient Instructions  Plan for short-term use of sleep medicine, Ambien, while we get  to a good dose for treatment of underlying anxiety/depression on the Fluoxetine. Let's plan to come off of the Ambien in the next 1-2 months and hopefully sleeping will be better on just the antidepressant. If sleep problems still persist, we'll talk about other options for long-term treatment.     Visit summary with medication list and pertinent instructions was printed for patient to review. All questions at time of visit were answered - patient instructed to contact office with any additional concerns. ER/RTC precautions were reviewed with the patient. Follow-up plan: Return in about 4 weeks (around 01/06/2016).

## 2015-12-09 NOTE — Patient Instructions (Addendum)
Plan for short-term use of sleep medicine, Ambien, while we get to a good dose for treatment of underlying anxiety/depression on the Fluoxetine. Let's plan to come off of the Ambien in the next 1-2 months and hopefully sleeping will be better on just the antidepressant. If sleep problems still persist, we'll talk about other options for long-term treatment.

## 2015-12-10 DIAGNOSIS — F331 Major depressive disorder, recurrent, moderate: Secondary | ICD-10-CM | POA: Insufficient documentation

## 2015-12-23 ENCOUNTER — Ambulatory Visit: Payer: Medicare Other | Admitting: Cardiology

## 2015-12-23 ENCOUNTER — Encounter: Payer: Self-pay | Admitting: Family Medicine

## 2015-12-23 ENCOUNTER — Ambulatory Visit (INDEPENDENT_AMBULATORY_CARE_PROVIDER_SITE_OTHER): Payer: Medicare Other | Admitting: Family Medicine

## 2015-12-23 ENCOUNTER — Ambulatory Visit (INDEPENDENT_AMBULATORY_CARE_PROVIDER_SITE_OTHER): Payer: Medicare Other

## 2015-12-23 VITALS — BP 134/50 | HR 79 | Temp 98.1°F | Wt 209.0 lb

## 2015-12-23 DIAGNOSIS — J209 Acute bronchitis, unspecified: Secondary | ICD-10-CM | POA: Diagnosis not present

## 2015-12-23 DIAGNOSIS — R05 Cough: Secondary | ICD-10-CM | POA: Diagnosis not present

## 2015-12-23 MED ORDER — IPRATROPIUM BROMIDE HFA 17 MCG/ACT IN AERS: 2.0000 | INHALATION_SPRAY | Freq: Four times a day (QID) | RESPIRATORY_TRACT | 1 refills | Status: DC | PRN

## 2015-12-23 MED ORDER — AZITHROMYCIN 250 MG PO TABS: 250.0000 mg | ORAL_TABLET | Freq: Every day | ORAL | 0 refills | Status: DC

## 2015-12-23 MED ORDER — PREDNISONE 10 MG PO TABS: 30.0000 mg | ORAL_TABLET | Freq: Every day | ORAL | 0 refills | Status: DC

## 2015-12-23 NOTE — Patient Instructions (Signed)
Thank you for coming in today. Take prednisone and use azithromycin.  Use the inhaler as needed.  Follow up with Dr Sheppard Coil and get Spirometry/Lung Function Test in about a month.  Call or go to the emergency room if you get worse, have trouble breathing, have chest pains, or palpitations.    Acute Bronchitis, Adult Acute bronchitis is when air tubes (bronchi) in the lungs suddenly get swollen. The condition can make it hard to breathe. It can also cause these symptoms:  A cough.  Coughing up clear, yellow, or green mucus.  Wheezing.  Chest congestion.  Shortness of breath.  A fever.  Body aches.  Chills.  A sore throat. Follow these instructions at home: Medicines  Take over-the-counter and prescription medicines only as told by your doctor.  If you were prescribed an antibiotic medicine, take it as told by your doctor. Do not stop taking the antibiotic even if you start to feel better. General instructions  Rest.  Drink enough fluids to keep your pee (urine) clear or pale yellow.  Avoid smoking and secondhand smoke. If you smoke and you need help quitting, ask your doctor. Quitting will help your lungs heal faster.  Use an inhaler, cool mist vaporizer, or humidifier as told by your doctor.  Keep all follow-up visits as told by your doctor. This is important. How is this prevented? To lower your risk of getting this condition again:  Wash your hands often with soap and water. If you cannot use soap and water, use hand sanitizer.  Avoid contact with people who have cold symptoms.  Try not to touch your hands to your mouth, nose, or eyes.  Make sure to get the flu shot every year. Contact a doctor if:  Your symptoms do not get better in 2 weeks. Get help right away if:  You cough up blood.  You have chest pain.  You have very bad shortness of breath.  You become dehydrated.  You faint (pass out) or keep feeling like you are going to pass out.  You  keep throwing up (vomiting).  You have a very bad headache.  Your fever or chills gets worse. This information is not intended to replace advice given to you by your health care provider. Make sure you discuss any questions you have with your health care provider. Document Released: 06/15/2007 Document Revised: 08/05/2015 Document Reviewed: 06/17/2015 Elsevier Interactive Patient Education  2017 [employer].

## 2015-12-23 NOTE — Progress Notes (Signed)
Kwana Reczek is a 70 y.o. female who presents to [place]: [place] today for cough congestion wheezing shortness of breath. Symptoms present for a few days. Patient is a history of recurrent bronchitis. Additionally she has a history of smoking quitting about 10 or 15 years ago. She has a pertinent past medical history significant for coronary artery disease. She notes in the past she was given albuterol nebulizer and had what sounds like delirium. She states that she is allergic to albuterol and codeine. She notes she can tolerate prednisone.    Past Medical History:  Diagnosis Date  . Heart attack   . Hx of CABG   . Hyperlipidemia   . Hypertension    Past Surgical History:  Procedure Laterality Date  . ABDOMINAL HYSTERECTOMY    . APPENDECTOMY    . BACK SURGERY    . BLADDER SURGERY    . BREAST LUMPECTOMY    . CORONARY ARTERY BYPASS GRAFT    . LYMPECTOMY  1980  . TONSILLECTOMY     Social History  Substance Use Topics  . Smoking status: Former Research scientist (life sciences)  . Smokeless tobacco: Never Used  . Alcohol use Yes     Comment: 1 q wk   family history includes Cancer in her mother; Heart attack in her father; Hyperlipidemia in her father and maternal grandmother; Hypertension in her father and maternal grandmother; Stroke in her father and maternal grandmother.  ROS as above:  Medications: Current Outpatient Prescriptions  Medication Sig Dispense Refill  . aspirin 81 MG tablet Take 81 mg by mouth daily.    . carvedilol (COREG) 12.5 MG tablet Take 12.5 mg by mouth 2 (two) times daily with a meal.    . diphenhydrAMINE (BENADRYL) 25 mg capsule Take 1 capsule (25 mg total) by mouth every 6 (six) hours as needed. 30 capsule 0  . estradiol (ESTRACE VAGINAL) 0.1 MG/GM vaginal cream Place [id number] Applicatorfuls vaginally 3 (three) times a week. 42.5 g 12  . FLUoxetine (PROZAC) 40 MG  capsule Take 1 capsule (40 mg total) by mouth daily. 30 capsule 1  . gabapentin (NEURONTIN) 600 MG tablet Take 1 tablet (600 mg total) by mouth in morning and afternoon. In even can take 2 tablet (1200 mg total). 120 tablet 3  . isosorbide mononitrate (IMDUR) 60 MG 24 hr tablet Take 1 tablet (60 mg total) by mouth daily. 90 tablet 3  . lactobacillus acidophilus (BACID) TABS tablet Take 2 tablets by mouth 3 (three) times daily.    Marland Kitchen lisinopril (PRINIVIL,ZESTRIL) 10 MG tablet Take 10 mg by mouth 2 (two) times daily.     . nitroGLYCERIN (NITROSTAT) 0.4 MG SL tablet Place 0.4 mg under the tongue every 5 (five) minutes as needed for chest pain.    . pravastatin (PRAVACHOL) 20 MG tablet Take 1 tablet (20 mg total) by mouth daily. 90 tablet 3  . ranolazine (RANEXA) 500 MG 12 hr tablet Take 500 mg by mouth 2 (two) times daily.    . Vitamin D, Ergocalciferol, (DRISDOL) 50000 units CAPS capsule take ONE Capsule by mouth ONCE WEEKLY FOR VITAMIN D REPLACEMENT  2  . azithromycin (ZITHROMAX) 250 MG tablet Take 1 tablet (250 mg total) by mouth daily. Take first 2 tablets together, then 1 every day until finished. 6 tablet 0  . ipratropium (ATROVENT HFA) 17 MCG/ACT inhaler Inhale 2 puffs into the lungs every 6 (six) hours as needed for wheezing. 1 Inhaler 1  .  predniSONE (DELTASONE) 10 MG tablet Take 3 tablets (30 mg total) by mouth daily with breakfast. 15 tablet 0   No current facility-administered medications for this visit.    Allergies  Allergen Reactions  . Ivp Dye [Iodinated Diagnostic Agents] Other (See Comments)    Itching and causes me to pass out  . Nitrofuran Derivatives Itching  . Levaquin [Levofloxacin In D5w] Other (See Comments)    "I get real dizzy and pass out"   . Albuterol   . Codeine   . Dilaudid [Hydromorphone]   . Metrizamide Other (See Comments)  . Percocet [Oxycodone-Acetaminophen]   . Percodan [Oxycodone-Aspirin]   . Sulfa Antibiotics Hives  . Penicillins Hives    Health  Maintenance Health Maintenance  Topic Date Due  . Hepatitis C Screening  11/04/1945  . TETANUS/TDAP  12/10/1962  . MAMMOGRAM  12/09/1993  . COLONOSCOPY  12/09/1993  . ZOSTAVAX  12/10/2003  . DEXA SCAN  12/09/2008  . PNA vac Low Risk Adult (1 of 2 - PCV13) 12/09/2008  . INFLUENZA VACCINE  08/11/2015     Exam:  BP (!) 134/50   Pulse 79   Temp 98.1 F (36.7 C) (Oral)   Wt 209 lb (94.8 kg)   SpO2 95%   BMI 34.78 kg/m  Gen: Well NAD Nontoxic appearing HEENT: EOMI,  MMM Lungs: Normal work of breathing. Wheezing and prolonged expiratory phase present bilaterally. Heart: RRR no MRG Abd: NABS, Soft. Nondistended, Nontender Exts: Brisk capillary refill, warm and well perfused.    No results found for this or any previous visit (from the past 72 hour(s)). No results found.    Assessment and Plan: 70 y.o. female with cough congestion and wheezing significant for bronchitis or possible COPD exacerbation. Plan for chest x-ray as part of workup. Additionally we'll treat empirically with prednisone and azithromycin antibiotics ipratropium inhaler and Tessalon Perles. Follow-up with PCP in about a month for spirometry to workup possible underlying COPD.   Orders Placed This Encounter  Procedures  . DG Chest 2 View    Order Specific Question:   Reason for exam:    Answer:   Cough, assess intra-thoracic pathology    Order Specific Question:   Preferred imaging location?    Answer:   [facility]    Discussed warning signs or symptoms. Please see discharge instructions. Patient expresses understanding.

## 2015-12-29 DIAGNOSIS — N390 Urinary tract infection, site not specified: Secondary | ICD-10-CM | POA: Diagnosis not present

## 2015-12-29 DIAGNOSIS — N200 Calculus of kidney: Secondary | ICD-10-CM | POA: Diagnosis not present

## 2015-12-29 DIAGNOSIS — N952 Postmenopausal atrophic vaginitis: Secondary | ICD-10-CM | POA: Diagnosis not present

## 2015-12-29 DIAGNOSIS — R351 Nocturia: Secondary | ICD-10-CM | POA: Diagnosis not present

## 2015-12-29 DIAGNOSIS — N3946 Mixed incontinence: Secondary | ICD-10-CM | POA: Diagnosis not present

## 2015-12-29 DIAGNOSIS — N930 Postcoital and contact bleeding: Secondary | ICD-10-CM | POA: Diagnosis not present

## 2015-12-29 DIAGNOSIS — N898 Other specified noninflammatory disorders of vagina: Secondary | ICD-10-CM | POA: Diagnosis not present

## 2015-12-31 ENCOUNTER — Encounter: Payer: Self-pay | Admitting: Osteopathic Medicine

## 2015-12-31 DIAGNOSIS — N898 Other specified noninflammatory disorders of vagina: Secondary | ICD-10-CM | POA: Insufficient documentation

## 2016-01-06 ENCOUNTER — Ambulatory Visit: Payer: Medicare Other | Admitting: Osteopathic Medicine

## 2016-01-25 ENCOUNTER — Ambulatory Visit (INDEPENDENT_AMBULATORY_CARE_PROVIDER_SITE_OTHER): Payer: Medicare Other | Admitting: Family Medicine

## 2016-01-25 ENCOUNTER — Other Ambulatory Visit: Payer: Self-pay

## 2016-01-25 ENCOUNTER — Encounter: Payer: Self-pay | Admitting: Family Medicine

## 2016-01-25 VITALS — BP 136/63 | HR 61 | Resp 18 | Ht 65.0 in | Wt 213.0 lb

## 2016-01-25 DIAGNOSIS — R0602 Shortness of breath: Secondary | ICD-10-CM | POA: Diagnosis not present

## 2016-01-25 DIAGNOSIS — D649 Anemia, unspecified: Secondary | ICD-10-CM

## 2016-01-25 LAB — CBC
HCT: 38.8 % (ref 35.0–45.0)
Hemoglobin: 12.8 g/dL (ref 11.7–15.5)
MCH: 30.5 pg (ref 27.0–33.0)
MCHC: 33 g/dL (ref 32.0–36.0)
MCV: 92.4 fL (ref 80.0–100.0)
MPV: 9.1 fL (ref 7.5–12.5)
PLATELETS: 250 10*3/uL (ref 140–400)
RBC: 4.2 MIL/uL (ref 3.80–5.10)
RDW: 14.3 % (ref 11.0–15.0)
WBC: 5.4 10*3/uL (ref 3.8–10.8)

## 2016-01-25 MED ORDER — LISINOPRIL 10 MG PO TABS: 10.0000 mg | ORAL_TABLET | Freq: Two times a day (BID) | ORAL | 1 refills | Status: DC

## 2016-01-25 MED ORDER — IPRATROPIUM BROMIDE 0.02 % IN SOLN
0.5000 mg | Freq: Once | RESPIRATORY_TRACT | Status: AC
  Administered 2016-01-25: 0.5 mg via RESPIRATORY_TRACT

## 2016-01-25 MED ORDER — VITAMIN D (ERGOCALCIFEROL) 1.25 MG (50000 UNIT) PO CAPS: 50000.0000 [IU] | ORAL_CAPSULE | ORAL | 1 refills | Status: DC

## 2016-01-25 NOTE — Telephone Encounter (Signed)
Carrie Salazar would like a refill on lisinopril and Vitamin D. Historical provider. Please advise.

## 2016-01-25 NOTE — Progress Notes (Signed)
Carrie Salazar is a 71 y.o. female who presents to [place]: [place] today for follow-up shortness of breath. Patient was seen a month ago for shortness of breath thought to be due to bronchitis. At that time I was concerned for possible cyst history of COPD based on smoking history. She was asked to follow-up today for spirometry. She notes that she feels as though she is at her lung baseline. She complains of persistent shortness of breath with exertion. Her history is complicated by history of CABG however she had a stress test in November 2017 that was unremarkable with an ejection fraction of 66%. She also notes a history of iron deficiency anemia requiring IV iron in the past.   Past Medical History:  Diagnosis Date  . Heart attack   . Hx of CABG   . Hyperlipidemia   . Hypertension    Past Surgical History:  Procedure Laterality Date  . ABDOMINAL HYSTERECTOMY    . APPENDECTOMY    . BACK SURGERY    . BLADDER SURGERY    . BREAST LUMPECTOMY    . CORONARY ARTERY BYPASS GRAFT    . LYMPECTOMY  1980  . TONSILLECTOMY     Social History  Substance Use Topics  . Smoking status: Former Research scientist (life sciences)  . Smokeless tobacco: Never Used  . Alcohol use Yes     Comment: 1 q wk   family history includes Cancer in her mother; Heart attack in her father; Hyperlipidemia in her father and maternal grandmother; Hypertension in her father and maternal grandmother; Stroke in her father and maternal grandmother.  ROS as above: Positive for sweating at times. No unexplained weight loss.   Medications: Current Outpatient Prescriptions  Medication Sig Dispense Refill  . aspirin 81 MG tablet Take 81 mg by mouth daily.    . carvedilol (COREG) 12.5 MG tablet Take 12.5 mg by mouth 2 (two) times daily with a meal.    . diphenhydrAMINE (BENADRYL) 25 mg capsule Take 1 capsule (25 mg total) by mouth  every 6 (six) hours as needed. 30 capsule 0  . estradiol (ESTRACE VAGINAL) 0.1 MG/GM vaginal cream Place [id number] Applicatorfuls vaginally 3 (three) times a week. 42.5 g 12  . FLUoxetine (PROZAC) 40 MG capsule Take 1 capsule (40 mg total) by mouth daily. 30 capsule 1  . gabapentin (NEURONTIN) 600 MG tablet Take 1 tablet (600 mg total) by mouth in morning and afternoon. In even can take 2 tablet (1200 mg total). 120 tablet 3  . ipratropium (ATROVENT HFA) 17 MCG/ACT inhaler Inhale 2 puffs into the lungs every 6 (six) hours as needed for wheezing. 1 Inhaler 1  . isosorbide mononitrate (IMDUR) 60 MG 24 hr tablet Take 1 tablet (60 mg total) by mouth daily. 90 tablet 3  . lactobacillus acidophilus (BACID) TABS tablet Take 2 tablets by mouth 3 (three) times daily.    Marland Kitchen lisinopril (PRINIVIL,ZESTRIL) 10 MG tablet Take 10 mg by mouth 2 (two) times daily.     . nitroGLYCERIN (NITROSTAT) 0.4 MG SL tablet Place 0.4 mg under the tongue every 5 (five) minutes as needed for chest pain.    . pravastatin (PRAVACHOL) 20 MG tablet Take 1 tablet (20 mg total) by mouth daily. 90 tablet 3  . ranolazine (RANEXA) 500 MG 12 hr tablet Take 500 mg by mouth 2 (two) times daily.    . Vitamin D, Ergocalciferol, (DRISDOL) 50000 units CAPS capsule take ONE Capsule by  mouth ONCE WEEKLY FOR VITAMIN D REPLACEMENT  2   No current facility-administered medications for this visit.    Allergies  Allergen Reactions  . Albuterol Palpitations    Also feels "funny in the head"  . Ivp Dye [Iodinated Diagnostic Agents] Other (See Comments)    Itching and causes me to pass out  . Nitrofuran Derivatives Itching  . Levaquin [Levofloxacin In D5w] Other (See Comments)    "I get real dizzy and pass out"   . Codeine   . Dilaudid [Hydromorphone]   . Metrizamide Other (See Comments)  . Percocet [Oxycodone-Acetaminophen]   . Percodan [Oxycodone-Aspirin]   . Sulfa Antibiotics Hives  . Penicillins Hives    Health Maintenance Health  Maintenance  Topic Date Due  . Hepatitis C Screening  07-10-1945  . TETANUS/TDAP  12/10/1962  . MAMMOGRAM  12/09/1993  . COLONOSCOPY  12/09/1993  . ZOSTAVAX  12/10/2003  . DEXA SCAN  12/09/2008  . PNA vac Low Risk Adult (1 of 2 - PCV13) 12/09/2008  . INFLUENZA VACCINE  08/11/2015     Exam:  BP 136/63   Pulse 61   Resp 18   Ht 5\' 5"  (1.651 m)   Wt 213 lb (96.6 kg)   SpO2 96%   BMI 35.45 kg/m  Gen: Well NAD HEENT: EOMI,  MMM Lungs: Normal work of breathing. CTABL Heart: RRR no MRG Abd: NABS, Soft. Nondistended, Nontender Exts: Brisk capillary refill, warm and well perfused.   Spirometry shows relatively normal with FEV's at 70% of predicted FEV1 86% of predicted. No significant change with Atrovent nebulizers. (Patient is allergic to albuterol.) Please see scanned document.    No results found for this or any previous visit (from the past 72 hour(s)). No results found.    Assessment and Plan: 71 y.o. female with SOB with exertion greater than expected given relatively normal Spirometry and normal stress test.   We'll obtain labs listed below to look for anemia or other potential causes of shortness of breath. If all is unremarkable will refer to pulmonology. Patient may just be deconditioned and will benefit from rehabilitation.   Orders Placed This Encounter  Procedures  . CBC  . COMPLETE METABOLIC PANEL WITH GFR  . Iron and TIBC  . Ferritin  . Spirometry: Pre & Post Eval    Discussed warning signs or symptoms. Please see discharge instructions. Patient expresses understanding.

## 2016-01-25 NOTE — Progress Notes (Signed)
HPI: FU coronary artery disease. CABG 2013; pt had PCI of PDA and PLA following CABG. Cardiac catheterization April 2016 showed severe three-vessel coronary disease, patent LIMA to the LAD, patent saphenous vein graft to the ramus intermedius, patent saphenous vein graft to the PDA with patent stents in the PDA beyond the graft insertion site, normal LV systolic function. Echocardiogram November 2016 showed normal LV function, mild LVH, mild left atrial enlargement and mild right ventricular enlargement. Holter monitor January 2017 showed sinus rhythm with possible atrial flutter, PACs, atrial couplets and atrial triplets. I obtained the strips from [place] and felt that it showed sinus with PAT. Anticoagulation discontinued. Nuclear study November 2017 showed ejection fraction 66% with normal perfusion. Since last seen, she denies chest pain. She notes dyspnea on exertion and orthopnea, PND, pedal edema or syncope.  Current Outpatient Prescriptions  Medication Sig Dispense Refill  . aspirin 81 MG tablet Take 81 mg by mouth daily.    . carvedilol (COREG) 12.5 MG tablet Take 12.5 mg by mouth 2 (two) times daily with a meal.    . diphenhydrAMINE (BENADRYL) 25 mg capsule Take 1 capsule (25 mg total) by mouth every 6 (six) hours as needed. 30 capsule 0  . estradiol (ESTRACE VAGINAL) 0.1 MG/GM vaginal cream Place [id number] Applicatorfuls vaginally 3 (three) times a week. 42.5 g 12  . FLUoxetine (PROZAC) 40 MG capsule Take 1 capsule (40 mg total) by mouth daily. 30 capsule 1  . gabapentin (NEURONTIN) 600 MG tablet TAKE 1 TABLET( 600 MG TOTAL) BY MOUTH IN THE MORNING AND AFTERNOON. IN EVENING CAN TAKE 2 TABLETS( 1200 MG TOTAL) 120 tablet 3  . ipratropium (ATROVENT HFA) 17 MCG/ACT inhaler Inhale 2 puffs into the lungs every 6 (six) hours as needed for wheezing. 1 Inhaler 1  . isosorbide mononitrate (IMDUR) 60 MG 24 hr tablet Take 1 tablet (60 mg total) by mouth daily. 90 tablet 3  . lisinopril  (PRINIVIL,ZESTRIL) 10 MG tablet Take 1 tablet (10 mg total) by mouth 2 (two) times daily. 90 tablet 1  . nitroGLYCERIN (NITROSTAT) 0.4 MG SL tablet Place 0.4 mg under the tongue every 5 (five) minutes as needed for chest pain.    . pravastatin (PRAVACHOL) 20 MG tablet Take 1 tablet (20 mg total) by mouth daily. 90 tablet 3  . ranolazine (RANEXA) 500 MG 12 hr tablet Take 1 tablet (500 mg total) by mouth 2 (two) times daily. 90 tablet 2  . Vitamin D, Ergocalciferol, (DRISDOL) 50000 units CAPS capsule Take 1 capsule (50,000 Units total) by mouth every 7 (seven) days. 12 capsule 1   No current facility-administered medications for this visit.      Past Medical History:  Diagnosis Date  . Heart attack   . Hx of CABG   . Hyperlipidemia   . Hypertension     Past Surgical History:  Procedure Laterality Date  . ABDOMINAL HYSTERECTOMY    . APPENDECTOMY    . BACK SURGERY    . BLADDER SURGERY    . BREAST LUMPECTOMY    . CORONARY ARTERY BYPASS GRAFT    . LYMPECTOMY  1980  . TONSILLECTOMY      Social History   Social History  . Marital status: Married    Spouse name: N/A  . Number of children: 2  . Years of education: N/A   Occupational History  . Not on file.   Social History Main Topics  . Smoking status: Former Research scientist (life sciences)  . Smokeless  tobacco: Never Used  . Alcohol use Yes     Comment: 1 q wk  . Drug use: No  . Sexual activity: Not on file   Other Topics Concern  . Not on file   Social History Narrative  . No narrative on file    Family History  Problem Relation Age of Onset  . Cancer Mother   . Hyperlipidemia Father   . Hypertension Father   . Stroke Father   . Heart attack Father   . Hyperlipidemia Maternal Grandmother   . Hypertension Maternal Grandmother   . Stroke Maternal Grandmother     ROS: fatigue but no fevers or chills, productive cough, hemoptysis, dysphasia, odynophagia, melena, hematochezia, dysuria, hematuria, rash, seizure activity, orthopnea, PND,  pedal edema, claudication. Remaining systems are negative.  Physical Exam: Well-developed obese in no acute distress.  Skin is warm and dry.  HEENT is normal.  Neck is supple.  Chest is clear to auscultation with normal expansion.  Cardiovascular exam is regular rate and rhythm.  Abdominal exam nontender or distended. No masses palpated. Extremities show no edema. neuro grossly intact  A/P  1 coronary artery disease-status post coronary artery bypass graft. Continue aspirin and statin.  2 hypertension-blood pressure controlled. Continue present medications.  3 hyperlipidemia-continue low-dose Pravachol. She did not tolerate Crestor or Lipitor previously.  4 question history of atrial flutter-I obtained previous rhythm strips and there appeared to be paroxysmal atrial tachycardia. I will continue off of anticoagulation. Continue beta blocker.   5 chest pain-no further chest pain. Nuclear study showed no ischemia. We'll not pursue further workup at this point.  Kirk Ruths, MD

## 2016-01-25 NOTE — Patient Instructions (Addendum)
Thank you for coming in today. Lets get some labs today to check anemia.  If this is normal we will have you see a lung doctor.  Follow up with Dr Sheppard Coil in 1 month.   Call or go to the emergency room if you get worse, have trouble breathing, have chest pains, or palpitations.    Shortness of Breath Shortness of breath means you have trouble breathing. Shortness of breath needs medical care right away. HOME CARE   Do not smoke.  Avoid being around chemicals or things (paint fumes, dust) that may bother your breathing.  Rest as needed. Slowly begin your normal activities.  Only take medicines as told by your doctor.  Keep all doctor visits as told. GET HELP RIGHT AWAY IF:   Your shortness of breath gets worse.  You feel lightheaded, pass out (faint), or have a cough that is not helped by medicine.  You cough up blood.  You have pain with breathing.  You have pain in your chest, arms, shoulders, or belly (abdomen).  You have a fever.  You cannot walk up stairs or exercise the way you normally do.  You do not get better in the time expected.  You have a hard time doing normal activities even with rest.  You have problems with your medicines.  You have any new symptoms. MAKE SURE YOU:  Understand these instructions.  Will watch your condition.  Will get help right away if you are not doing well or get worse. This information is not intended to replace advice given to you by your health care provider. Make sure you discuss any questions you have with your health care provider. Document Released: 06/15/2007 Document Revised: 01/01/2013 Document Reviewed: 03/14/2011 Elsevier Interactive Patient Education  2017 [employer].

## 2016-01-26 ENCOUNTER — Other Ambulatory Visit: Payer: Self-pay | Admitting: Osteopathic Medicine

## 2016-01-26 DIAGNOSIS — G5793 Unspecified mononeuropathy of bilateral lower limbs: Secondary | ICD-10-CM

## 2016-01-26 LAB — COMPLETE METABOLIC PANEL WITH GFR
ALT: 15 U/L (ref 6–29)
AST: 18 U/L (ref 10–35)
Albumin: 4.2 g/dL (ref 3.6–5.1)
Alkaline Phosphatase: 56 U/L (ref 33–130)
BUN: 16 mg/dL (ref 7–25)
CHLORIDE: 105 mmol/L (ref 98–110)
CO2: 25 mmol/L (ref 20–31)
Calcium: 9.7 mg/dL (ref 8.6–10.4)
Creat: 0.99 mg/dL — ABNORMAL HIGH (ref 0.60–0.93)
GFR, EST AFRICAN AMERICAN: 66 mL/min (ref >=60)
GFR, Est Non African American: 57 mL/min — ABNORMAL LOW (ref >=60)
Glucose, Bld: 77 mg/dL (ref 65–99)
Potassium: 4.7 mmol/L (ref 3.5–5.3)
Sodium: 139 mmol/L (ref 135–146)
Total Bilirubin: 0.4 mg/dL (ref 0.2–1.2)
Total Protein: 6.6 g/dL (ref 6.1–8.1)

## 2016-01-26 LAB — IRON AND TIBC
%SAT: 31 % (ref 11–50)
Iron: 103 ug/dL (ref 45–160)
TIBC: 327 ug/dL (ref 250–450)
UIBC: 224 ug/dL (ref 125–400)

## 2016-01-26 LAB — FERRITIN: Ferritin: 61 ng/mL (ref 20–288)

## 2016-01-26 NOTE — Addendum Note (Signed)
Addended by: Gregor Hams on: 01/26/2016 06:55 AM   Modules accepted: Orders

## 2016-01-29 ENCOUNTER — Other Ambulatory Visit: Payer: Self-pay

## 2016-01-29 MED ORDER — RANOLAZINE ER 500 MG PO TB12: 500.0000 mg | ORAL_TABLET | Freq: Two times a day (BID) | ORAL | 2 refills | Status: DC

## 2016-02-03 ENCOUNTER — Ambulatory Visit (INDEPENDENT_AMBULATORY_CARE_PROVIDER_SITE_OTHER): Payer: Medicare Other | Admitting: Cardiology

## 2016-02-03 ENCOUNTER — Encounter: Payer: Self-pay | Admitting: Cardiology

## 2016-02-03 VITALS — BP 125/79 | HR 73 | Ht 65.0 in | Wt 209.4 lb

## 2016-02-03 DIAGNOSIS — E7800 Pure hypercholesterolemia, unspecified: Secondary | ICD-10-CM | POA: Diagnosis not present

## 2016-02-03 DIAGNOSIS — I2510 Atherosclerotic heart disease of native coronary artery without angina pectoris: Secondary | ICD-10-CM | POA: Diagnosis not present

## 2016-02-03 DIAGNOSIS — I10 Essential (primary) hypertension: Secondary | ICD-10-CM | POA: Diagnosis not present

## 2016-02-03 NOTE — Patient Instructions (Signed)
Your physician wants you to follow-up in: 6 MONTHS WITH DR CRENSHAW You will receive a reminder letter in the mail two months in advance. If you don't receive a letter, please call our office to schedule the follow-up appointment.   If you need a refill on your cardiac medications before your next appointment, please call your pharmacy.  

## 2016-02-15 DIAGNOSIS — Z951 Presence of aortocoronary bypass graft: Secondary | ICD-10-CM | POA: Diagnosis not present

## 2016-02-15 DIAGNOSIS — Z981 Arthrodesis status: Secondary | ICD-10-CM | POA: Diagnosis not present

## 2016-02-15 DIAGNOSIS — Z87440 Personal history of urinary (tract) infections: Secondary | ICD-10-CM | POA: Diagnosis not present

## 2016-02-15 DIAGNOSIS — J45909 Unspecified asthma, uncomplicated: Secondary | ICD-10-CM | POA: Diagnosis not present

## 2016-02-15 DIAGNOSIS — I252 Old myocardial infarction: Secondary | ICD-10-CM | POA: Diagnosis not present

## 2016-02-15 DIAGNOSIS — Z9119 Patient's noncompliance with other medical treatment and regimen: Secondary | ICD-10-CM | POA: Diagnosis not present

## 2016-02-15 DIAGNOSIS — Z885 Allergy status to narcotic agent status: Secondary | ICD-10-CM | POA: Diagnosis not present

## 2016-02-15 DIAGNOSIS — K639 Disease of intestine, unspecified: Secondary | ICD-10-CM | POA: Diagnosis not present

## 2016-02-15 DIAGNOSIS — Z7982 Long term (current) use of aspirin: Secondary | ICD-10-CM | POA: Diagnosis not present

## 2016-02-15 DIAGNOSIS — Z87891 Personal history of nicotine dependence: Secondary | ICD-10-CM | POA: Diagnosis not present

## 2016-02-15 DIAGNOSIS — I119 Hypertensive heart disease without heart failure: Secondary | ICD-10-CM | POA: Diagnosis not present

## 2016-02-15 DIAGNOSIS — G4700 Insomnia, unspecified: Secondary | ICD-10-CM | POA: Diagnosis not present

## 2016-02-15 DIAGNOSIS — E785 Hyperlipidemia, unspecified: Secondary | ICD-10-CM | POA: Diagnosis not present

## 2016-02-15 DIAGNOSIS — N390 Urinary tract infection, site not specified: Secondary | ICD-10-CM | POA: Diagnosis not present

## 2016-02-15 DIAGNOSIS — D235 Other benign neoplasm of skin of trunk: Secondary | ICD-10-CM | POA: Diagnosis not present

## 2016-02-15 DIAGNOSIS — M797 Fibromyalgia: Secondary | ICD-10-CM | POA: Diagnosis not present

## 2016-02-15 DIAGNOSIS — N761 Subacute and chronic vaginitis: Secondary | ICD-10-CM | POA: Diagnosis not present

## 2016-02-15 DIAGNOSIS — G4733 Obstructive sleep apnea (adult) (pediatric): Secondary | ICD-10-CM | POA: Diagnosis not present

## 2016-02-15 DIAGNOSIS — G8929 Other chronic pain: Secondary | ICD-10-CM | POA: Diagnosis not present

## 2016-02-15 DIAGNOSIS — Z87442 Personal history of urinary calculi: Secondary | ICD-10-CM | POA: Diagnosis not present

## 2016-02-15 DIAGNOSIS — Z86010 Personal history of colonic polyps: Secondary | ICD-10-CM | POA: Diagnosis not present

## 2016-02-15 DIAGNOSIS — I2510 Atherosclerotic heart disease of native coronary artery without angina pectoris: Secondary | ICD-10-CM | POA: Diagnosis not present

## 2016-02-15 DIAGNOSIS — Z955 Presence of coronary angioplasty implant and graft: Secondary | ICD-10-CM | POA: Diagnosis not present

## 2016-02-15 DIAGNOSIS — Z880 Allergy status to penicillin: Secondary | ICD-10-CM | POA: Diagnosis not present

## 2016-02-23 ENCOUNTER — Other Ambulatory Visit: Payer: Self-pay

## 2016-02-23 MED ORDER — CARVEDILOL 12.5 MG PO TABS: 12.5000 mg | ORAL_TABLET | Freq: Two times a day (BID) | ORAL | 2 refills | Status: DC

## 2016-02-25 ENCOUNTER — Ambulatory Visit (INDEPENDENT_AMBULATORY_CARE_PROVIDER_SITE_OTHER): Payer: Medicare Other | Admitting: Osteopathic Medicine

## 2016-02-25 ENCOUNTER — Other Ambulatory Visit: Payer: Self-pay | Admitting: Osteopathic Medicine

## 2016-02-25 ENCOUNTER — Encounter: Payer: Self-pay | Admitting: Osteopathic Medicine

## 2016-02-25 ENCOUNTER — Other Ambulatory Visit: Payer: Self-pay | Admitting: Cardiology

## 2016-02-25 VITALS — BP 157/72 | HR 67 | Ht 65.0 in | Wt 210.0 lb

## 2016-02-25 DIAGNOSIS — G4489 Other headache syndrome: Secondary | ICD-10-CM

## 2016-02-25 MED ORDER — CARVEDILOL 12.5 MG PO TABS: 12.5000 mg | ORAL_TABLET | Freq: Two times a day (BID) | ORAL | 11 refills | Status: DC

## 2016-02-25 MED ORDER — CARVEDILOL 12.5 MG PO TABS: 12.5000 mg | ORAL_TABLET | Freq: Two times a day (BID) | ORAL | 0 refills | Status: DC

## 2016-02-25 MED ORDER — ACETAZOLAMIDE 125 MG PO TABS: 125.0000 mg | ORAL_TABLET | Freq: Three times a day (TID) | ORAL | 0 refills | Status: DC

## 2016-02-25 NOTE — Telephone Encounter (Signed)
Rx(s) sent to pharmacy electronically.  

## 2016-02-25 NOTE — Progress Notes (Signed)
HPI: Carrie Salazar is a 71 y.o. female  who presents to [facility] today, 02/25/16,  for chief complaint of:  Chief Complaint  Patient presents with  . Headache    Every other day for 4 weeks    Headache - gone many years and now back  . 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 No records available.  . Duration: 4-6 weeks ago headaches started back again,  . Modifying factors: has  Been taking some Hydrocodone which she was recently prescribed for gyn surgery  . Assoc signs/symptoms: vision changes w/ blurry vision in R eye worse, tinnitus, hearing decreased maybe a bit worse on the L, noticed some indentations in the skull as well few weeks ago    Past medical history, surgical history, social history and family history reviewed.  Patient Active Problem List   Diagnosis Date Noted  . SOB (shortness of breath) 01/25/2016  . Anemia 01/25/2016  . Vaginal lesion 03-12-2015  . Moderate episode of recurrent major depressive disorder ([place]) 12/10/2015  . Female cystocele 10/16/2015  . Urinary urgency 10/16/2015  . History of obstructive sleep apnea 08/20/2015  . Atrial flutter ([place]) 08/20/2015  . 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 Chronic back pain 08/13/2015  . Recurrent UTI 08/13/2015  . Insomnia 08/13/2015  . Hyperhidrosis 08/13/2015    Current medication list and allergy/intolerance information reviewed.   Current Outpatient Prescriptions on File Prior to Visit  Medication Sig Dispense Refill  . aspirin 81 MG tablet Take 81 mg by mouth daily.    . carvedilol (COREG) 12.5 MG tablet Take 1 tablet (12.5 mg total) by mouth 2 (two) times daily with a meal. 60 tablet 2  . diphenhydrAMINE (BENADRYL) 25 mg capsule  Take 1 capsule (25 mg total) by mouth every 6 (six) hours as needed. 30 capsule 0  . estradiol (ESTRACE VAGINAL) 0.1 MG/GM vaginal cream Place [id number] Applicatorfuls vaginally 3 (three) times a week. 42.5 g 12  . FLUoxetine (PROZAC) 40 MG capsule Take 1 capsule (40 mg total) by mouth daily. 30 capsule 1  . gabapentin (NEURONTIN) 600 MG tablet TAKE 1 TABLET( 600 MG TOTAL) BY MOUTH IN THE MORNING AND AFTERNOON. IN EVENING CAN TAKE 2 TABLETS( 1200 MG TOTAL) 120 tablet 3  . ipratropium (ATROVENT HFA) 17 MCG/ACT inhaler Inhale 2 puffs into the lungs every 6 (six) hours as needed for wheezing. 1 Inhaler 1  . isosorbide mononitrate (IMDUR) 60 MG 24 hr tablet Take 1 tablet (60 mg total) by mouth daily. 90 tablet 3  . lisinopril (PRINIVIL,ZESTRIL) 10 MG tablet Take 1 tablet (10 mg total) by mouth 2 (two) times daily. 90 tablet 1  . nitroGLYCERIN (NITROSTAT) 0.4 MG SL tablet Place 0.4 mg under the tongue every 5 (five) minutes as needed for chest pain.    . pravastatin (PRAVACHOL) 20 MG tablet Take 1 tablet (20 mg total) by mouth daily. 90 tablet 3  . ranolazine (RANEXA) 500 MG 12 hr tablet Take 1 tablet (500 mg total) by mouth 2 (two) times daily. 90 tablet 2  . Vitamin D, Ergocalciferol, (DRISDOL) 50000 units CAPS capsule Take 1 capsule (50,000 Units total) by mouth every 7 (seven) days. 12 capsule 1   No current facility-administered medications on file prior to visit.    Allergies  Allergen Reactions  . Albuterol Palpitations and Other (See Comments)    HALLUCINATIONS Also feels "funny in the head"  . Dilaudid [Hydromorphone] Anaphylaxis    Caused patient to have heart attack and crazy in the head  . Ivp Dye [Iodinated Diagnostic Agents] Other (See Comments)    Itching and causes me to pass out  . Metrizamide Other (See Comments), Itching, Rash and Hives    Itching and causes me to pass out  . Nitrofuran Derivatives Itching  . Nitrofurantoin Itching  . Percocet [Oxycodone-Acetaminophen] Itching   . Sulfa Antibiotics Hives and Other (See Comments)    Itching and causes me to pass out**PT CAN TAKE BENADRYL 2 HOURS PRIOR AND IS OK**   . Tape Itching  . Levaquin [Levofloxacin In D5w] Other (See Comments)    "I get real dizzy and pass out"  "I get real dizzy and pass out"   . Codeine Itching and Hives  . Latex Other (See Comments)    LEAVES REALLY BAD BURN ON SKIN   . Percodan [Oxycodone-Aspirin]   . Sulfasalazine Hives  . Nickel Rash  . Penicillins Hives  . Sulfamethoxazole Rash and Hives      Review of Systems:  Constitutional: No recent illness  HEENT: +headache, +vision change  Cardiac: No  chest pain, No  pressure, No palpitations  Respiratory:  No  shortness of breath. No  Cough  Skin: No  Rash  Neurologic: No focal weakness or speech difficulty   Exam:  BP (!) 157/72   Pulse 67   Ht 5\' 5"  (1.651 m)   Wt 210 lb (95.3 kg)   BMI 34.95 kg/m   Constitutional: VS see above. General Appearance: alert, well-developed, well-nourished, NAD  Eyes: Normal lids and conjunctive, non-icteric sclera, PERRLA, limited funduscopic exam appears normal  Ears, Nose, Mouth, Throat: MMM, Normal external inspection ears/nares/mouth/lips/gums.  Neck: No masses, trachea midline.   Respiratory: Normal respiratory effort. no wheeze, no rhonchi, no rales  Cardiovascular: S1/S2 normal, no murmur, no rub/gallop auscultated. RRR.   Musculoskeletal: Gait normal. Symmetric and independent movement of all extremities. Slight indentation linear in skull extends from airline on R above eye back approx 2 inches ?lateral frontalis muscle?   Neurological: Normal balance/coordination. No tremor.   Skin: warm, dry, intact.   Psychiatric: Normal judgment/insight. Normal mood and affect. Oriented x3.    Recent Results (from the past 2160 hour(s))  CBC     Status: None   Collection Time: 01/25/16  2:39 PM  Result Value Ref Range   WBC 5.4 3.8 - 10.8 K/uL   RBC 4.20 3.80 - 5.10 MIL/uL    Hemoglobin 12.8 11.7 - 15.5 g/dL   HCT 38.8 35.0 - 45.0 %   MCV 92.4 80.0 - 100.0 fL   MCH 30.5 27.0 - 33.0 pg   MCHC 33.0 32.0 - 36.0 g/dL   RDW 14.3 11.0 - 15.0 %   Platelets 250 140 - 400 K/uL   MPV 9.1 7.5 - 12.5 fL  COMPLETE METABOLIC PANEL WITH GFR     Status: Abnormal   Collection Time: 01/25/16  2:39 PM  Result Value Ref Range   Sodium 139 135 - 146 mmol/L   Potassium 4.7 3.5 - 5.3 mmol/L   Chloride 105 98 - 110 mmol/L   CO2 25 20 - 31 mmol/L   Glucose, Bld 77 65 - 99 mg/dL   BUN 16 7 - 25 mg/dL   Creat 0.99 (H) 0.60 - 0.93 mg/dL  Comment:   For patients > or = 73 years of age: The upper reference limit for Creatinine is approximately 13% higher for people identified as African-American.      Total Bilirubin 0.4 0.2 - 1.2 mg/dL   Alkaline Phosphatase 56 33 - 130 U/L   AST 18 10 - 35 U/L   ALT 15 6 - 29 U/L   Total Protein 6.6 6.1 - 8.1 g/dL   Albumin 4.2 3.6 - 5.1 g/dL   Calcium 9.7 8.6 - 10.4 mg/dL   GFR, Est African American 66 >=60 mL/min   GFR, Est Non African American 57 (L) >=60 mL/min  Iron and TIBC     Status: None   Collection Time: 01/25/16  2:39 PM  Result Value Ref Range   Iron 103 45 - 160 ug/dL   UIBC 224 125 - 400 ug/dL   TIBC 327 250 - 450 ug/dL   %SAT 31 11 - 50 %  Ferritin     Status: None   Collection Time: 01/25/16  2:39 PM  Result Value Ref Range   Ferritin 61 20 - 288 ng/mL      ASSESSMENT/PLAN:   Poor historian, no records, complicated headache syndrome. 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 Patient has been stable off of previous medications for years, she states this current headache syndrome is exactly like what she had back in the 1990s.   Will get urgent neurology referral and MRI. Patient is advised that if any worse, she should proceed to the hospital but I believe she is stable for discharge home to self-care at this point, no  mental status changes. Follow-up early next week.  Other headache syndrome - Previous workup at [facility], previously controlled on Diamox, questionable diagnosis, ?NPH or other - Plan: MR Brain W Wo Contrast, Ambulatory referral to Neurology    Follow-up plan: Patient has appointment in 4 days  Visit summary with medication list and pertinent instructions was printed for patient to review, alert [place] if any changes needed. All questions at time of visit were answered - patient instructed to contact office with any additional concerns. ER/RTC precautions were reviewed with the patient and understanding verbalized.

## 2016-02-26 ENCOUNTER — Telehealth: Payer: Self-pay | Admitting: Osteopathic Medicine

## 2016-02-26 ENCOUNTER — Other Ambulatory Visit: Payer: Self-pay

## 2016-02-26 MED ORDER — PREDNISONE 50 MG PO TABS: ORAL_TABLET | ORAL | 0 refills | Status: DC

## 2016-02-26 NOTE — Telephone Encounter (Signed)
Premedication sent, Pt advised.

## 2016-02-26 NOTE — Telephone Encounter (Signed)
-----   Message from Katha Hamming sent at 02/26/2016  2:52 PM EST ----- Regarding: MRI Brain w/wo Carrie Salazar is allergic to the intravenous contrast.  She will have to be premedicated according to [facility] Protocol to have her MRI.  If you need the instructions, we can fax those to you or you can get them from [facility] imaging.  Thanks, Hoyle Sauer  [facility] - [facility]

## 2016-02-29 ENCOUNTER — Emergency Department (HOSPITAL_COMMUNITY): Payer: Medicare Other

## 2016-02-29 ENCOUNTER — Telehealth: Payer: Self-pay | Admitting: Osteopathic Medicine

## 2016-02-29 ENCOUNTER — Encounter (HOSPITAL_COMMUNITY): Payer: Self-pay

## 2016-02-29 ENCOUNTER — Ambulatory Visit (INDEPENDENT_AMBULATORY_CARE_PROVIDER_SITE_OTHER): Payer: Medicare Other | Admitting: Osteopathic Medicine

## 2016-02-29 ENCOUNTER — Encounter: Payer: Self-pay | Admitting: Osteopathic Medicine

## 2016-02-29 ENCOUNTER — Emergency Department (HOSPITAL_COMMUNITY)
Admission: EM | Admit: 2016-02-29 | Discharge: 2016-02-29 | Disposition: A | Discharge status: Home / Self Care | Payer: Medicare Other | Attending: Emergency Medicine | Admitting: Emergency Medicine

## 2016-02-29 VITALS — BP 90/45 | HR 70 | Ht 65.0 in | Wt 209.0 lb

## 2016-02-29 DIAGNOSIS — R51 Headache: Secondary | ICD-10-CM | POA: Diagnosis not present

## 2016-02-29 DIAGNOSIS — N289 Disorder of kidney and ureter, unspecified: Secondary | ICD-10-CM | POA: Diagnosis not present

## 2016-02-29 DIAGNOSIS — Z79899 Other long term (current) drug therapy: Secondary | ICD-10-CM | POA: Diagnosis not present

## 2016-02-29 DIAGNOSIS — Z951 Presence of aortocoronary bypass graft: Secondary | ICD-10-CM | POA: Insufficient documentation

## 2016-02-29 DIAGNOSIS — I2510 Atherosclerotic heart disease of native coronary artery without angina pectoris: Secondary | ICD-10-CM | POA: Insufficient documentation

## 2016-02-29 DIAGNOSIS — I10 Essential (primary) hypertension: Secondary | ICD-10-CM | POA: Insufficient documentation

## 2016-02-29 DIAGNOSIS — Z7982 Long term (current) use of aspirin: Secondary | ICD-10-CM | POA: Diagnosis not present

## 2016-02-29 DIAGNOSIS — Z91040 Latex allergy status: Secondary | ICD-10-CM | POA: Diagnosis not present

## 2016-02-29 DIAGNOSIS — G4489 Other headache syndrome: Secondary | ICD-10-CM | POA: Diagnosis not present

## 2016-02-29 DIAGNOSIS — G44009 Cluster headache syndrome, unspecified, not intractable: Secondary | ICD-10-CM | POA: Insufficient documentation

## 2016-02-29 DIAGNOSIS — I952 Hypotension due to drugs: Secondary | ICD-10-CM

## 2016-02-29 LAB — BASIC METABOLIC PANEL
ANION GAP: 11 (ref 5–15)
BUN: 28 mg/dL — ABNORMAL HIGH (ref 6–20)
CO2: 20 mmol/L — ABNORMAL LOW (ref 22–32)
Calcium: 9.7 mg/dL (ref 8.9–10.3)
Chloride: 106 mmol/L (ref 101–111)
Creatinine, Ser: 1.65 mg/dL — ABNORMAL HIGH (ref 0.44–1.00)
GFR calc Af Amer: 35 mL/min — ABNORMAL LOW (ref >60)
GFR, EST NON AFRICAN AMERICAN: 30 mL/min — AB (ref >60)
Glucose, Bld: 91 mg/dL (ref 65–99)
POTASSIUM: 4.7 mmol/L (ref 3.5–5.1)
Sodium: 137 mmol/L (ref 135–145)

## 2016-02-29 LAB — CBC
HEMATOCRIT: 39.6 % (ref 36.0–46.0)
HEMOGLOBIN: 13 g/dL (ref 12.0–15.0)
MCH: 30.7 pg (ref 26.0–34.0)
MCHC: 32.8 g/dL (ref 30.0–36.0)
MCV: 93.6 fL (ref 78.0–100.0)
Platelets: 249 10*3/uL (ref 150–400)
RBC: 4.23 MIL/uL (ref 3.87–5.11)
RDW: 13.8 % (ref 11.5–15.5)
WBC: 6.6 10*3/uL (ref 4.0–10.5)

## 2016-02-29 MED ORDER — METOCLOPRAMIDE HCL 5 MG/ML IJ SOLN
5.0000 mg | Freq: Once | INTRAMUSCULAR | Status: AC
  Administered 2016-02-29: 5 mg via INTRAVENOUS
  Filled 2016-02-29: qty 2

## 2016-02-29 MED ORDER — ACETAMINOPHEN 325 MG PO TABS
650.0000 mg | ORAL_TABLET | Freq: Once | ORAL | Status: AC
  Administered 2016-02-29: 650 mg via ORAL
  Filled 2016-02-29: qty 2

## 2016-02-29 NOTE — Telephone Encounter (Signed)
-----   Message from Katha Hamming sent at 02/29/2016  1:56 PM EST ----- Regarding: MRI The radiology order states patient had abnormal MRI elsewhere.  Do you have a copy of that report, we don't see it in EPIC or care everywhere?   It would be nice to have that copy.   Thanks, Hoyle Sauer

## 2016-02-29 NOTE — ED Notes (Signed)
Requested disc for patient to take to appt tomorrow from Radiology

## 2016-02-29 NOTE — Discharge Instructions (Signed)
Stop Diamox. Keep your scheduled appointment with your neurologist tomorrow. Your kidney function is slightly abnormal. Blood creatinine is 1.65. Ask Dr. Sheppard Coil to recheck your kidney function within the next 2 or 3 weeks. Take the copy of the CT scan with you to the neurologist office

## 2016-02-29 NOTE — ED Provider Notes (Addendum)
[facility] DEPT Provider Note   CSN: [id number] Arrival date & time: 02/29/16  1450     History   Chief Complaint No chief complaint on file. Chief complaint headache and dizziness  HPI Carrie Salazar is a 71 y.o. female. Patient presents with headaches occipital and behind both eyes onset approximately 3 weeks ago Gradually, throbbing in nature she reports she fell and struck her occiput level days before the headache began. 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 She's been seen by her PCP Dr. Sheppard Coil for the same complaint and started on Diamox and blood pressure was noted to be in the proximal E 90/40. Other associated symptoms include nausea no vomiting and slightly blurred vision.  HPI  Past Medical History:  Diagnosis Date  . Heart attack   . Hx of CABG   . Hyperlipidemia   . Hypertension     Patient Active Problem List   Diagnosis Date Noted  . Other headache syndrome 02/25/2016  . SOB (shortness of breath) 01/25/2016  . Anemia 01/25/2016  . Vaginal lesion 08-10-202017  . Moderate episode of recurrent major depressive disorder ([place]) 12/10/2015  . Female cystocele 10/16/2015  . Urinary urgency 10/16/2015  . History of obstructive sleep apnea 08/20/2015  . Atrial flutter ([place]) 08/20/2015  . 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 Chronic back pain 08/13/2015  . Recurrent UTI 08/13/2015  . Insomnia 08/13/2015  . Hyperhidrosis 08/13/2015    Past Surgical History:  Procedure Laterality Date  . ABDOMINAL HYSTERECTOMY    . APPENDECTOMY    . BACK SURGERY    . BLADDER SURGERY    . BREAST LUMPECTOMY    . CORONARY ARTERY BYPASS GRAFT    . LYMPECTOMY  1980  . TONSILLECTOMY      OB History    No data available       Home Medications    Prior to Admission medications   Medication Sig Start Date  End Date Taking? Authorizing Provider  aspirin 81 MG tablet Take 81 mg by mouth daily.    Historical Provider, MD  diphenhydrAMINE (BENADRYL) 25 mg capsule Take 1 capsule (25 mg total) by mouth every 6 (six) hours as needed. 09/24/15   [name], MD  estradiol (ESTRACE VAGINAL) 0.1 MG/GM vaginal cream Place [id number] Applicatorfuls vaginally 3 (three) times a week. 08/14/15   [name], DO  FLUoxetine (PROZAC) 40 MG capsule Take 1 capsule (40 mg total) by mouth daily. 12/09/15   [name], DO  gabapentin (NEURONTIN) 600 MG tablet TAKE 1 TABLET( 600 MG TOTAL) BY MOUTH IN THE MORNING AND AFTERNOON. IN EVENING CAN TAKE 2 TABLETS( 1200 MG TOTAL) 01/27/16   [name], DO  ipratropium (ATROVENT HFA) 17 MCG/ACT inhaler Inhale 2 puffs into the lungs every 6 (six) hours as needed for wheezing. 12/23/15   Gregor Hams, MD  isosorbide mononitrate (IMDUR) 60 MG 24 hr tablet Take 1 tablet (60 mg total) by mouth daily. 11/04/15   Lelon Perla, MD  nitroGLYCERIN (NITROSTAT) 0.4 MG SL tablet Place 0.4 mg under the tongue every 5 (five) minutes as needed for chest pain.    Historical Provider, MD  pravastatin (PRAVACHOL) 20 MG tablet Take 1 tablet (20 mg total) by mouth daily. 10/30/15   Lelon Perla, MD  predniSONE (DELTASONE) 50 MG tablet Take one tablet 13 hours before MRI, one tablet 7  hours before MRI, one tablet 1 hour before MRI. Take 50mg  benadryl 1 hour before MRI. 02/26/16   [name], DO  ranolazine (RANEXA) 500 MG 12 hr tablet Take 1 tablet (500 mg total) by mouth 2 (two) times daily. 01/29/16   Lelon Perla, MD  Vitamin D, Ergocalciferol, (DRISDOL) 50000 units CAPS capsule Take 1 capsule (50,000 Units total) by mouth every 7 (seven) days. 01/25/16   [name], DO    Family History Family History  Problem Relation Age of Onset  . Cancer Mother   . Hyperlipidemia Father   . Hypertension Father   . Stroke Father   . Heart attack Father   . Hyperlipidemia  Maternal Grandmother   . Hypertension Maternal Grandmother   . Stroke Maternal Grandmother     Social History Social History  Substance Use Topics  . Smoking status: Former Research scientist (life sciences)  . Smokeless tobacco: Never Used  . Alcohol use Yes     Comment: 1 q wk     Allergies   Albuterol; Dilaudid [hydromorphone]; Ivp dye [iodinated diagnostic agents]; Metrizamide; Nitrofuran derivatives; Nitrofurantoin; Percocet [oxycodone-acetaminophen]; Sulfa antibiotics; Tape; Levaquin [levofloxacin in d5w]; Codeine; Latex; Percodan [oxycodone-aspirin]; Sulfasalazine; Nickel; Penicillins; and Sulfamethoxazole   Review of Systems Review of Systems  Eyes: Positive for visual disturbance.  Musculoskeletal: Positive for gait problem.       Walks with cane  Neurological: Positive for dizziness and headaches.       Dizziness meaning lightheadedness  All other systems reviewed and are negative.    Physical Exam Updated Vital Signs BP 120/56   Pulse (!) 56   Temp 98.8 F (37.1 C) (Oral)   Resp 18   SpO2 96%   Physical Exam  Constitutional: She is oriented to person, place, and time. She appears well-developed and well-nourished.  HENT:  Head: Normocephalic and atraumatic.  No Facial asymmetry  Eyes: Conjunctivae are normal. Pupils are equal, round, and reactive to light.  Optic discs sharp. Visual acuity 20/40 left eye 20/40 right eye  Neck: Neck supple. No tracheal deviation present. No thyromegaly present.  Cardiovascular: Normal rate and regular rhythm.   No murmur heard. Pulmonary/Chest: Effort normal and breath sounds normal.  Abdominal: Soft. Bowel sounds are normal. She exhibits no distension. There is no tenderness.  Musculoskeletal: Normal range of motion. She exhibits no edema or tenderness.  Neurological: She is alert and oriented to person, place, and time. Coordination normal.  Gait normal Romberg normal pronator drift normal finger to nose normal. Walks unassisted without  difficulty  Skin: Skin is warm and dry. No rash noted.  Psychiatric: She has a normal mood and affect.  Nursing note and vitals reviewed.    ED Treatments / Results  Labs (all labs ordered are listed, but only abnormal results are displayed) Labs Reviewed  BASIC METABOLIC PANEL - Abnormal; Notable for the following:       Result Value   CO2 20 (*)    BUN 28 (*)    Creatinine, Ser 1.65 (*)    GFR calc non Af Amer 30 (*)    GFR calc Af Amer 35 (*)    All other components within normal limits  CBC   Results for orders placed or performed during the hospital encounter of [id number]  Basic metabolic panel  Result Value Ref Range   Sodium 137 135 - 145 mmol/L   Potassium 4.7 3.5 - 5.1 mmol/L   Chloride 106 101 - 111 mmol/L   CO2 20 (L) 22 -  32 mmol/L   Glucose, Bld 91 65 - 99 mg/dL   BUN 28 (H) 6 - 20 mg/dL   Creatinine, Ser 1.65 (H) 0.44 - 1.00 mg/dL   Calcium 9.7 8.9 - 10.3 mg/dL   GFR calc non Af Amer 30 (L) >60 mL/min   GFR calc Af Amer 35 (L) >60 mL/min   Anion gap 11 5 - 15  CBC  Result Value Ref Range   WBC 6.6 4.0 - 10.5 K/uL   RBC 4.23 3.87 - 5.11 MIL/uL   Hemoglobin 13.0 12.0 - 15.0 g/dL   HCT 39.6 36.0 - 46.0 %   MCV 93.6 78.0 - 100.0 fL   MCH 30.7 26.0 - 34.0 pg   MCHC 32.8 30.0 - 36.0 g/dL   RDW 13.8 11.5 - 15.5 %   Platelets 249 150 - 400 K/uL   Ct Head Wo Contrast  Result Date: 02/29/2016 CLINICAL DATA:  Acute onset of headache, with pain about the eyes and the back of the head. Vomiting. Initial encounter. EXAM: CT HEAD WITHOUT CONTRAST TECHNIQUE: Contiguous axial images were obtained from the base of the skull through the vertex without intravenous contrast. COMPARISON:  None. FINDINGS: Brain: No evidence of acute infarction, hemorrhage, hydrocephalus, extra-axial collection or mass lesion/mass effect. Prominence of the ventricles and sulci reflects mild cortical volume loss. Mild cerebellar atrophy is noted. Scattered periventricular and subcortical white  matter change likely reflects small vessel ischemic microangiopathy. The brainstem and fourth ventricle are within normal limits. The basal ganglia are unremarkable in appearance. The cerebral hemispheres demonstrate grossly normal gray-white differentiation. No mass effect or midline shift is seen. Vascular: No hyperdense vessel or unexpected calcification. Skull: There is no evidence of fracture; visualized osseous structures are unremarkable in appearance. Sinuses/Orbits: The orbits are within normal limits. The paranasal sinuses and mastoid air cells are well-aerated. Other: No significant soft tissue abnormalities are seen. IMPRESSION: 1. No evidence of traumatic intracranial injury or fracture. 2. Mild cortical volume loss and scattered small vessel ischemic microangiopathy. Electronically Signed   By: Garald Balding M.D.   On: 02/29/2016 20:55   EKG  EKG Interpretation  Date/Time:  Monday February 29 2016 15:01:05 EST Ventricular Rate:  66 PR Interval:  186 QRS Duration: 80 QT Interval:  380 QTC Calculation: 398 R Axis:   67 Text Interpretation:  Normal sinus rhythm Nonspecific T wave abnormality Abnormal ECG No old tracing to compare Confirmed by Winfred Leeds  MD, Jayonna Meyering [phone]) on 02/29/2016 6:20:21 PM       Radiology No results found.  Procedures Procedures (including critical care time)  Medications Ordered in ED Medications  metoCLOPramide (REGLAN) injection 5 mg (not administered)     Initial Impression / Assessment and Plan / ED Course  I have reviewed the triage vital signs and the nursing notes.  Pertinent labs & imaging results that were available during my care of the patient were reviewed by me and considered in my medical decision making (see chart for details).     9:45 PM headache improved after treatment with intravenous Reglan. Headache is mild presently. Requesting Tylenol. Ordered by me. Plan she is to keep her scheduled plan with her neurologist tomorrow. Stop  Diamox  Final Clinical Impressions(s) / ED Diagnoses  Diagnosis #1cluster headache #2Mild renal insufficiency Final diagnoses:  None    New Prescriptions New Prescriptions   No medications on file     Orlie Dakin, MD 02/29/16 2152    Orlie Dakin, MD 03/01/16 0020

## 2016-02-29 NOTE — Progress Notes (Signed)
HPI: Carrie Salazar is a 71 y.o. female  who presents to [facility] today, 02/29/16,  for chief complaint of:  Chief Complaint  Patient presents with  . Follow-up    Hypotension - SBP 157 4 days ago, now low 90s/80s with DBP in 40s. Likely drug effect but lack of lab results and symptomatic hypotension, also recent postop status and uncertain etiology of severe headache.  Headache - gone many years and now back. Seen last week 02/25/16 (4 days ago) . 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 No records available. Diamox lower dose refilled last week. Repeat MRI and neuro referral pending at this time. BP low today and pt c/o dizziness . Duration: 4-6 weeks ago headaches started back again,  . Assoc signs/symptoms: vision changes w/ blurry vision in R eye worse, tinnitus, hearing decreased maybe a bit worse on the L, dizziness today       Past medical history, surgical history, social history and family history reviewed.  Patient Active Problem List   Diagnosis Date Noted  . Other headache syndrome 02/25/2016  . SOB (shortness of breath) 01/25/2016  . Anemia 01/25/2016  . Vaginal lesion 10/14/202017  . Moderate episode of recurrent major depressive disorder ([place]) 12/10/2015  . Female cystocele 10/16/2015  . Urinary urgency 10/16/2015  . History of obstructive sleep apnea 08/20/2015  . Atrial flutter ([place]) 08/20/2015  . 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 Chronic back pain 08/13/2015  . Recurrent UTI 08/13/2015  . Insomnia 08/13/2015  . Hyperhidrosis 08/13/2015    Current medication list and allergy/intolerance information reviewed.   Current Outpatient Prescriptions on File Prior to Visit  Medication Sig  Dispense Refill  . acetaZOLAMIDE (DIAMOX) 125 MG tablet TAKE 1 TABLET(125 MG) BY MOUTH THREE TIMES DAILY 270 tablet 0  . aspirin 81 MG tablet Take 81 mg by mouth daily.    . carvedilol (COREG) 12.5 MG tablet Take 1 tablet (12.5 mg total) by mouth 2 (two) times daily with a meal. 60 tablet 11  . diphenhydrAMINE (BENADRYL) 25 mg capsule Take 1 capsule (25 mg total) by mouth every 6 (six) hours as needed. 30 capsule 0  . estradiol (ESTRACE VAGINAL) 0.1 MG/GM vaginal cream Place [id number] Applicatorfuls vaginally 3 (three) times a week. 42.5 g 12  . FLUoxetine (PROZAC) 40 MG capsule Take 1 capsule (40 mg total) by mouth daily. 30 capsule 1  . gabapentin (NEURONTIN) 600 MG tablet TAKE 1 TABLET( 600 MG TOTAL) BY MOUTH IN THE MORNING AND AFTERNOON. IN EVENING CAN TAKE 2 TABLETS( 1200 MG TOTAL) 120 tablet 3  . ipratropium (ATROVENT HFA) 17 MCG/ACT inhaler Inhale 2 puffs into the lungs every 6 (six) hours as needed for wheezing. 1 Inhaler 1  . isosorbide mononitrate (IMDUR) 60 MG 24 hr tablet Take 1 tablet (60 mg total) by mouth daily. 90 tablet 3  . lisinopril (PRINIVIL,ZESTRIL) 10 MG tablet Take 1 tablet (10 mg total) by mouth 2 (two) times daily. 90 tablet 1  . nitroGLYCERIN (NITROSTAT) 0.4 MG SL tablet Place 0.4 mg under the tongue every 5 (five) minutes as needed for chest pain.    . pravastatin (PRAVACHOL) 20 MG tablet Take 1 tablet (20 mg total) by mouth daily. 90 tablet 3  . predniSONE (DELTASONE) 50 MG tablet Take one tablet  13 hours before MRI, one tablet 7 hours before MRI, one tablet 1 hour before MRI. Take 50mg  benadryl 1 hour before MRI. 3 tablet 0  . ranolazine (RANEXA) 500 MG 12 hr tablet Take 1 tablet (500 mg total) by mouth 2 (two) times daily. 90 tablet 2  . Vitamin D, Ergocalciferol, (DRISDOL) 50000 units CAPS capsule Take 1 capsule (50,000 Units total) by mouth every 7 (seven) days. 12 capsule 1   No current facility-administered medications on file prior to visit.    Allergies  Allergen  Reactions  . Albuterol Palpitations and Other (See Comments)    HALLUCINATIONS Also feels "funny in the head"  . Dilaudid [Hydromorphone] Anaphylaxis    Caused patient to have heart attack and crazy in the head  . Ivp Dye [Iodinated Diagnostic Agents] Other (See Comments)    Itching and causes me to pass out  . Metrizamide Other (See Comments), Itching, Rash and Hives    Itching and causes me to pass out  . Nitrofuran Derivatives Itching  . Nitrofurantoin Itching  . Percocet [Oxycodone-Acetaminophen] Itching  . Sulfa Antibiotics Hives and Other (See Comments)    Itching and causes me to pass out**PT CAN TAKE BENADRYL 2 HOURS PRIOR AND IS OK**   . Tape Itching  . Levaquin [Levofloxacin In D5w] Other (See Comments)    "I get real dizzy and pass out"  "I get real dizzy and pass out"   . Codeine Itching and Hives  . Latex Other (See Comments)    LEAVES REALLY BAD BURN ON SKIN   . Percodan [Oxycodone-Aspirin]   . Sulfasalazine Hives  . Nickel Rash  . Penicillins Hives  . Sulfamethoxazole Rash and Hives      Review of Systems:  Constitutional: No recent illness  HEENT: +headache, +vision change  Cardiac: No  chest pain, No  pressure, No palpitations  Respiratory:  +shortness of breath. No  Cough  Skin: No  Rash  Neurologic: +weakness generalized, no speech difficulty, + dizziness   Exam:  BP (!) 93/47   Pulse 70   Ht 5\' 5"  (1.651 m)   Wt 209 lb (94.8 kg)   BMI 34.78 kg/m   Constitutional: VS see above. General Appearance: alert, pale, NAD  Eyes: Normal lids and conjunctive, non-icteric sclera  Ears, Nose, Mouth, Throat: MMM  Neck: No masses, trachea midline.   Respiratory: Normal respiratory effort. no wheeze, no rhonchi, no rales  Cardiovascular: S1/S2 normal  Musculoskeletal: Gait normal.   Neurological: Normal balance/coordination. No tremor.   Skin: warm, dry, intact.   Psychiatric: Normal judgment/insight. Normal mood and affect. Oriented x3.     Recent Results (from the past 2160 hour(s))  CBC     Status: None   Collection Time: 01/25/16  2:39 PM  Result Value Ref Range   WBC 5.4 3.8 - 10.8 K/uL   RBC 4.20 3.80 - 5.10 MIL/uL   Hemoglobin 12.8 11.7 - 15.5 g/dL   HCT 38.8 35.0 - 45.0 %   MCV 92.4 80.0 - 100.0 fL   MCH 30.5 27.0 - 33.0 pg   MCHC 33.0 32.0 - 36.0 g/dL   RDW 14.3 11.0 - 15.0 %   Platelets 250 140 - 400 K/uL   MPV 9.1 7.5 - 12.5 fL  COMPLETE METABOLIC PANEL WITH GFR     Status: Abnormal   Collection Time: 01/25/16  2:39 PM  Result Value Ref Range   Sodium 139 135 - 146 mmol/L   Potassium 4.7 3.5 - 5.3  mmol/L   Chloride 105 98 - 110 mmol/L   CO2 25 20 - 31 mmol/L   Glucose, Bld 77 65 - 99 mg/dL   BUN 16 7 - 25 mg/dL   Creat 0.99 (H) 0.60 - 0.93 mg/dL    Comment:   For patients > or = 73 years of age: The upper reference limit for Creatinine is approximately 13% higher for people identified as African-American.      Total Bilirubin 0.4 0.2 - 1.2 mg/dL   Alkaline Phosphatase 56 33 - 130 U/L   AST 18 10 - 35 U/L   ALT 15 6 - 29 U/L   Total Protein 6.6 6.1 - 8.1 g/dL   Albumin 4.2 3.6 - 5.1 g/dL   Calcium 9.7 8.6 - 10.4 mg/dL   GFR, Est African American 66 >=60 mL/min   GFR, Est Non African American 57 (L) >=60 mL/min  Iron and TIBC     Status: None   Collection Time: 01/25/16  2:39 PM  Result Value Ref Range   Iron 103 45 - 160 ug/dL   UIBC 224 125 - 400 ug/dL   TIBC 327 250 - 450 ug/dL   %SAT 31 11 - 50 %  Ferritin     Status: None   Collection Time: 01/25/16  2:39 PM  Result Value Ref Range   Ferritin 61 20 - 288 ng/mL      ASSESSMENT/PLAN: The primary encounter diagnosis was Hypotension due to drugs. A diagnosis of Other headache syndrome was also pertinent to this visit.   Option to fluid resuscitate here in the office and get labs and hold BP meds but I think given SBP <50 plus time delay for labs as well as lack of clear headache etiology and recent postop status I think she needs  urgent monitoring and treatment/workup in the ER, r/o bleed or sepsis  Headache: Poor historian, no records for previous headache w/u, complicated headache syndrome. 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 Patient has been stable off of previous medications for years, she states this current headache syndrome is exactly like what she had back in the 1990s. Diamox was started at low dose 4 days ago and pt now extremely hypotensive - certainly possible BP dropped d/t medications but surprising it was to this degree given low dose of other BP meds.   MRI and neuro referral pending      Follow-up plan: to ER by private vehicle  All questions at time of visit were answered - patient instructed to contact office with any additional concerns. Will call to follow-up with patient in 1-2 days

## 2016-02-29 NOTE — ED Triage Notes (Signed)
Patient sent from her MD office for further evaluation of hypotension and dizziness in the office today. On arrival reports dizziness is resolving, BP normal on arrival. States that she started diamox for headache and then the dizziness started over the weekend. NAD

## 2016-03-01 ENCOUNTER — Ambulatory Visit: Payer: Self-pay | Admitting: Neurology

## 2016-03-02 ENCOUNTER — Telehealth: Payer: Self-pay | Admitting: Osteopathic Medicine

## 2016-03-02 DIAGNOSIS — T814XXA Infection following a procedure, initial encounter: Secondary | ICD-10-CM | POA: Diagnosis not present

## 2016-03-02 NOTE — Telephone Encounter (Signed)
Faxed information to [facility] to see if Pt can be scheduled there.

## 2016-03-02 NOTE — Telephone Encounter (Signed)
Okay to schedule patient at closed bore MRI. Did she need anti-anxiety meds? She did not follow-up with neurology, she should've been discharged from the emergency department in time to see them, I'm not sure what happened. Can we please make sure that she has an appointment scheduled with them, it doesn't look to me like anything was rescheduled for her.

## 2016-03-02 NOTE — Telephone Encounter (Signed)
[facility] returned call, they can not preform the MRI.  Faxed information to [facility] imaging for review.

## 2016-03-04 NOTE — Telephone Encounter (Signed)
Called to get an update from [place], still under review from MRI tech.

## 2016-03-04 NOTE — Telephone Encounter (Signed)
Pt can be scheduled at [facility] for her MRI. They will contact to schedule.  Routing to PCP for pre-medication.

## 2016-03-05 ENCOUNTER — Inpatient Hospital Stay (HOSPITAL_BASED_OUTPATIENT_CLINIC_OR_DEPARTMENT_OTHER): Admission: RE | Admit: 2016-03-05 | Payer: Medicare Other | Source: Ambulatory Visit

## 2016-03-07 MED ORDER — DIAZEPAM 5 MG PO TABS: 5.0000 mg | ORAL_TABLET | Freq: Once | ORAL | 0 refills | Status: AC

## 2016-03-07 NOTE — Telephone Encounter (Signed)
Left VM for Pt to return clinic call regarding MRI, Rx, and Neuro appt. Callback information provided.

## 2016-03-07 NOTE — Telephone Encounter (Signed)
Can call pt:  Valium Rx written, will fax to pharmacy [facility]).  STILL NEEDS NEURO APPOINTMENT can we look into this or ask the patient to reschedule - she cancelled her appt last week I believe. At least needs to follow-up with me in the office since she was in the ER.

## 2016-03-07 NOTE — Telephone Encounter (Signed)
Pt decided not to get MRI until she discusses recent CT with PCP. appt made for tomorrow. Advised not to pick up pre meds unless she decides to get MRI. Advised to make a neuro appt for this week as well.

## 2016-03-07 NOTE — Telephone Encounter (Signed)
Noted  

## 2016-03-08 ENCOUNTER — Encounter: Payer: Self-pay | Admitting: Osteopathic Medicine

## 2016-03-08 ENCOUNTER — Other Ambulatory Visit: Payer: Self-pay | Admitting: Osteopathic Medicine

## 2016-03-08 ENCOUNTER — Ambulatory Visit (INDEPENDENT_AMBULATORY_CARE_PROVIDER_SITE_OTHER): Payer: Medicare Other | Admitting: Osteopathic Medicine

## 2016-03-08 VITALS — BP 135/71 | HR 76 | Wt 211.0 lb

## 2016-03-08 DIAGNOSIS — G4709 Other insomnia: Secondary | ICD-10-CM

## 2016-03-08 DIAGNOSIS — G4489 Other headache syndrome: Secondary | ICD-10-CM

## 2016-03-08 MED ORDER — ASPIRIN-ACETAMINOPHEN-CAFFEINE 250-250-65 MG PO TABS: 1.0000 | ORAL_TABLET | Freq: Four times a day (QID) | ORAL | 0 refills | Status: DC | PRN

## 2016-03-08 MED ORDER — TRAZODONE HCL 100 MG PO TABS: 50.0000 mg | ORAL_TABLET | Freq: Every evening | ORAL | 1 refills | Status: DC | PRN

## 2016-03-08 NOTE — Progress Notes (Signed)
HPI: Carrie Salazar is a 71 y.o. female  who presents to [facility] today, 03/08/16,  for chief complaint of:  Chief Complaint  Patient presents with  . Headache    Here for headache follow-up. Recently hypotensive most likely due to overuse of Diamox, patient states the headaches were resolved on that but she was feeling dizzy/lightheaded. This has improved with discontinuation of the medication, hypotension has resolved. Patient states the headaches are back, describes as behind both eyes but mostly left, vague history of abnormal MRI and previous workup at the [facility] and headaches which were responsive at that time to Diamox, we have been trying to arrange outpatient MRI and neurology follow-up for her, she skipped her neurology appointment due to having been in the ER the previous night and just not feeling well the next day. We had to reschedule MRI due to accommodation of back implant. At this point, patient states that she would rather hold off on the MRI until she speaks with a neurologist first and CT done in the emergency department was negative.   Past medical history, surgical history, social history and family history reviewed.  Patient Active Problem List   Diagnosis Date Noted  . Other headache syndrome 02/25/2016  . SOB (shortness of breath) 01/25/2016  . Anemia 01/25/2016  . Vaginal lesion 12-20-202017  . Moderate episode of recurrent major depressive disorder ([place]) 12/10/2015  . Female cystocele 10/16/2015  . Urinary urgency 10/16/2015  . History of obstructive sleep apnea 08/20/2015  . Atrial flutter ([place]) 08/20/2015  . 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 Chronic back pain 08/13/2015  . Recurrent UTI 08/13/2015  . Insomnia 08/13/2015  . Hyperhidrosis 08/13/2015    Current medication list and allergy/intolerance information reviewed.   Current  Outpatient Prescriptions on File Prior to Visit  Medication Sig Dispense Refill  . aspirin 81 MG tablet Take 81 mg by mouth daily.    . carvedilol (COREG) 12.5 MG tablet Take 12.5 mg by mouth 2 (two) times daily.  0  . diphenhydrAMINE (BENADRYL) 25 mg capsule Take 1 capsule (25 mg total) by mouth every 6 (six) hours as needed. 30 capsule 0  . estradiol (ESTRACE VAGINAL) 0.1 MG/GM vaginal cream Place [id number] Applicatorfuls vaginally 3 (three) times a week. 42.5 g 12  . FLUoxetine (PROZAC) 40 MG capsule Take 1 capsule (40 mg total) by mouth daily. 30 capsule 1  . gabapentin (NEURONTIN) 600 MG tablet TAKE 1 TABLET( 600 MG TOTAL) BY MOUTH IN THE MORNING AND AFTERNOON. IN EVENING CAN TAKE 2 TABLETS( 1200 MG TOTAL) 120 tablet 3  . ipratropium (ATROVENT HFA) 17 MCG/ACT inhaler Inhale 2 puffs into the lungs every 6 (six) hours as needed for wheezing. 1 Inhaler 1  . isosorbide mononitrate (IMDUR) 60 MG 24 hr tablet Take 1 tablet (60 mg total) by mouth daily. 90 tablet 3  . lisinopril (PRINIVIL,ZESTRIL) 10 MG tablet Take 10 mg by mouth daily.  1  . nitroGLYCERIN (NITROSTAT) 0.4 MG SL tablet Place 0.4 mg under the tongue every 5 (five) minutes as needed for chest pain.    . pravastatin (PRAVACHOL) 20 MG tablet Take 1 tablet (20 mg total) by mouth daily. 90 tablet 3  . predniSONE (DELTASONE) 50 MG tablet Take one tablet 13 hours before MRI, one tablet 7 hours before MRI, one tablet 1 hour before MRI. Take 50mg  benadryl 1 hour before MRI. 3 tablet 0  .  ranolazine (RANEXA) 500 MG 12 hr tablet Take 1 tablet (500 mg total) by mouth 2 (two) times daily. 90 tablet 2  . Vitamin D, Ergocalciferol, (DRISDOL) 50000 units CAPS capsule Take 1 capsule (50,000 Units total) by mouth every 7 (seven) days. 12 capsule 1   No current facility-administered medications on file prior to visit.    Allergies  Allergen Reactions  . Albuterol Palpitations and Other (See Comments)    HALLUCINATIONS Also feels "funny in the head"  .  Dilaudid [Hydromorphone] Anaphylaxis    Caused patient to have heart attack and crazy in the head  . Ivp Dye [Iodinated Diagnostic Agents] Other (See Comments)    Itching and causes me to pass out  . Metrizamide Other (See Comments), Itching, Rash and Hives    Itching and causes me to pass out  . Nitrofuran Derivatives Itching  . Nitrofurantoin Itching  . Percocet [Oxycodone-Acetaminophen] Itching  . Sulfa Antibiotics Hives and Other (See Comments)    Itching and causes me to pass out**PT CAN TAKE BENADRYL 2 HOURS PRIOR AND IS OK**   . Tape Itching  . Levaquin [Levofloxacin In D5w] Other (See Comments)    "I get real dizzy and pass out"  "I get real dizzy and pass out"   . Codeine Itching and Hives  . Lanolin Hives    "eats skin off"  . Latex Other (See Comments)    LEAVES REALLY BAD BURN ON SKIN   . Percodan [Oxycodone-Aspirin]   . Sulfasalazine Hives  . Nickel Rash  . Penicillins Hives  . Sulfamethoxazole Rash and Hives      Review of Systems:  Constitutional: No recent illness  HEENT: +headache, no vision change  Cardiac: No  chest pain, No  pressure, No palpitations  Respiratory:  No  shortness of breath. No  Cough  Gastrointestinal: No  abdominal pain, no change on bowel habits  Musculoskeletal: No new myalgia/arthralgia  Skin: No  Rash  Hem/Onc: No  easy bruising/bleeding, No  abnormal lumps/bumps  Neurologic: No  weakness, No  Dizziness  Psychiatric: No  concerns with depression, No  concerns with anxiety  Exam:  BP 135/71   Pulse 76   Wt 211 lb (95.7 kg)   BMI 35.11 kg/m   Constitutional: VS see above. General Appearance: alert, well-developed, well-nourished, NAD  Eyes: Normal lids and conjunctive, non-icteric sclera Limites non-dilated funduscopic exam appears normal  Ears, Nose, Mouth, Throat: MMM  Neck: No masses, trachea midline.   Respiratory: Normal respiratory effort  Cardiovascular: S1/S2 normal  Musculoskeletal: Gait normal.  Symmetric and independent movement of all extremities  Neurological: Normal balance/coordination. No tremor.  Skin: warm, dry, intact.   Psychiatric: Normal judgment/insight. Normal mood and affect. Oriented x3.   CT Head images personally reviewed and report was reviewed with the patient.    ASSESSMENT/PLAN:  Informed atient of my concerns of foregoing the MRI, we have already had to delay it a few times already. We may be missing something serious, reassuring that CT is negative but of course we do not have full information and she has an unusual history of headache and I do not have previous records of what the problem was, sounds like possible pseudotumor cerebrii, patient is not experiencing any dizziness, falling, confusion. See we can do about getting her into see neurology ASAP. Patient has capacity to make her own medical decisions with understanding of risks versus benefits of getting MRI soon versus deferring this decision to neurology.   Other headache  syndrome - Plan: aspirin-acetaminophen-caffeine (EXCEDRIN MIGRAINE) 250-250-65 MG tablet  Other insomnia - Plan: traZODone (DESYREL) 100 MG tablet     Follow-up plan: Return for further discussion once neuro referral completes, within 4-6 weeks for sure.  Visit summary with medication list and pertinent instructions was printed for patient to review, alert [place] if any changes needed. All questions at time of visit were answered - patient instructed to contact office with any additional concerns. ER/RTC precautions were reviewed with the patient and understanding verbalized.   Note: Total time spent 25 minutes, greater than 50% of the visit was spent face-to-face counseling and coordinating care for the following: The primary encounter diagnosis was Other headache syndrome. A diagnosis of Other insomnia was also pertinent to this visit.Marland Kitchen

## 2016-03-14 ENCOUNTER — Other Ambulatory Visit: Payer: Self-pay | Admitting: Osteopathic Medicine

## 2016-03-14 ENCOUNTER — Telehealth: Payer: Self-pay | Admitting: Neurology

## 2016-03-14 ENCOUNTER — Ambulatory Visit (INDEPENDENT_AMBULATORY_CARE_PROVIDER_SITE_OTHER): Payer: Medicare Other | Admitting: Neurology

## 2016-03-14 ENCOUNTER — Encounter: Payer: Self-pay | Admitting: Neurology

## 2016-03-14 VITALS — BP 135/78 | HR 67 | Ht 65.0 in | Wt 212.4 lb

## 2016-03-14 DIAGNOSIS — G43011 Migraine without aura, intractable, with status migrainosus: Secondary | ICD-10-CM | POA: Diagnosis not present

## 2016-03-14 DIAGNOSIS — R51 Headache: Secondary | ICD-10-CM

## 2016-03-14 DIAGNOSIS — H539 Unspecified visual disturbance: Secondary | ICD-10-CM

## 2016-03-14 DIAGNOSIS — G4733 Obstructive sleep apnea (adult) (pediatric): Secondary | ICD-10-CM | POA: Diagnosis not present

## 2016-03-14 DIAGNOSIS — R519 Headache, unspecified: Secondary | ICD-10-CM

## 2016-03-14 MED ORDER — ONDANSETRON 4 MG PO TBDP: ORAL_TABLET | ORAL | 6 refills | Status: DC

## 2016-03-14 MED ORDER — TOPIRAMATE 25 MG PO TABS: 50.0000 mg | ORAL_TABLET | Freq: Every day | ORAL | 6 refills | Status: DC

## 2016-03-14 MED ORDER — METHYLPREDNISOLONE 4 MG PO TBPK: ORAL_TABLET | ORAL | 0 refills | Status: DC

## 2016-03-14 NOTE — Telephone Encounter (Signed)
Can you call patient and tell her the number to call and schedule her MRI at [facility] is:  The phone number is [phone]   Thanks!

## 2016-03-14 NOTE — Patient Instructions (Addendum)
Remember to drink plenty of fluid, eat healthy meals and do not skip any meals. Try to eat protein with a every meal and eat a healthy snack such as fruit or nuts in between meals. Try to keep a regular sleep-wake schedule and try to exercise daily, particularly in the form of walking, 20-30 minutes a day, if you can.   As far as your medications are concerned, I would like to suggest:  Medrol Dosepak Start Topiramate 25mg  at night for 3 days then increase to 50mg  (2 pills) at night Zofran 3x a day for nausea and dizziness Medrol dosepak take pills every day in the morning with food  As far as diagnostic testing: MRI brain  I would like to see you back in 4-6 weeks, sooner if we need to. Please call us with any interim questions, concerns, problems, updates or refill requests.   Our phone number is [phone]. We also have an after hours call service for urgent matters and there is a physician on-call for urgent questions. For any emergencies you know to call 911 or go to the nearest emergency room  Topiramate tablets What is this medicine? TOPIRAMATE (toe PYRE a mate) is used to treat seizures in adults or children with epilepsy. It is also used for the prevention of migraine headaches. This medicine may be used for other purposes; ask your health care provider or pharmacist if you have questions. COMMON BRAND NAME(S): Topamax, Topiragen What should I tell my health care provider before I take this medicine? They need to know if you have any of these conditions: -bleeding disorders -cirrhosis of the liver or liver disease -diarrhea -glaucoma -kidney stones or kidney disease -low blood counts, like low white cell, platelet, or red cell counts -lung disease like asthma, obstructive pulmonary disease, emphysema -metabolic acidosis -on a ketogenic diet -schedule for surgery or a procedure -suicidal thoughts, plans, or attempt; a previous suicide attempt by you or a family member -an  unusual or allergic reaction to topiramate, other medicines, foods, dyes, or preservatives -pregnant or trying to get pregnant -breast-feeding How should I use this medicine? Take this medicine by mouth with a glass of water. Follow the directions on the prescription label. Do not crush or chew. You may take this medicine with meals. Take your medicine at regular intervals. Do not take it more often than directed. Talk to your pediatrician regarding the use of this medicine in children. Special care may be needed. While this drug may be prescribed for children as young as 84 years of age for selected conditions, precautions do apply. Overdosage: If you think you have taken too much of this medicine contact a poison control center or emergency room at once. NOTE: This medicine is only for you. Do not share this medicine with others. What if I miss a dose? If you miss a dose, take it as soon as you can. If your next dose is to be taken in less than 6 hours, then do not take the missed dose. Take the next dose at your regular time. Do not take double or extra doses. What may interact with this medicine? Do not take this medicine with any of the following medications: -probenecid This medicine may also interact with the following medications: -acetazolamide -alcohol -amitriptyline -aspirin and aspirin-like medicines -birth control pills -certain medicines for depression -certain medicines for seizures -certain medicines that treat or prevent blood clots like warfarin, enoxaparin, dalteparin, apixaban, dabigatran, and rivaroxaban -digoxin -hydrochlorothiazide -lithium -medicines for pain,  sleep, or muscle relaxation -metformin -methazolamide -NSAIDS, medicines for pain and inflammation, like ibuprofen or naproxen -pioglitazone -risperidone This list may not describe all possible interactions. Give your health care provider a list of all the medicines, herbs, non-prescription drugs, or  dietary supplements you use. Also tell them if you smoke, drink alcohol, or use illegal drugs. Some items may interact with your medicine. What should I watch for while using this medicine? Visit your doctor or health care professional for regular checks on your progress. Do not stop taking this medicine suddenly. This increases the risk of seizures if you are using this medicine to control epilepsy. Wear a medical identification bracelet or chain to say you have epilepsy or seizures, and carry a card that lists all your medicines. This medicine can decrease sweating and increase your body temperature. Watch for signs of deceased sweating or fever, especially in children. Avoid extreme heat, hot baths, and saunas. Be careful about exercising, especially in hot weather. Contact your health care provider right away if you notice a fever or decrease in sweating. You should drink plenty of fluids while taking this medicine. If you have had kidney stones in the past, this will help to reduce your chances of forming kidney stones. If you have stomach pain, with nausea or vomiting and yellowing of your eyes or skin, call your doctor immediately. You may get drowsy, dizzy, or have blurred vision. Do not drive, use machinery, or do anything that needs mental alertness until you know how this medicine affects you. To reduce dizziness, do not sit or stand up quickly, especially if you are an older patient. Alcohol can increase drowsiness and dizziness. Avoid alcoholic drinks. If you notice blurred vision, eye pain, or other eye problems, seek medical attention at once for an eye exam. The use of this medicine may increase the chance of suicidal thoughts or actions. Pay special attention to how you are responding while on this medicine. Any worsening of mood, or thoughts of suicide or dying should be reported to your health care professional right away. This medicine may increase the chance of developing metabolic  acidosis. If left untreated, this can cause kidney stones, bone disease, or slowed growth in children. Symptoms include breathing fast, fatigue, loss of appetite, irregular heartbeat, or loss of consciousness. Call your doctor immediately if you experience any of these side effects. Also, tell your doctor about any surgery you plan on having while taking this medicine since this may increase your risk for metabolic acidosis. Birth control pills may not work properly while you are taking this medicine. Talk to your doctor about using an extra method of birth control. Women who become pregnant while using this medicine may enroll in the [place] Pregnancy Registry by calling ([phone]. This registry collects information about the safety of antiepileptic drug use during pregnancy. What side effects may I notice from receiving this medicine? Side effects that you should report to your doctor or health care professional as soon as possible: -allergic reactions like skin rash, itching or hives, swelling of the face, lips, or tongue -decreased sweating and/or rise in body temperature -depression -difficulty breathing, fast or irregular breathing patterns -difficulty speaking -difficulty walking or controlling muscle movements -hearing impairment -redness, blistering, peeling or loosening of the skin, including inside the mouth -tingling, pain or numbness in the hands or feet -unusual bleeding or bruising -unusually weak or tired -worsening of mood, thoughts or actions of suicide or dying Side effects that usually  do not require medical attention (report to your doctor or health care professional if they continue or are bothersome): -altered taste -back pain, joint or muscle aches and pains -diarrhea, or constipation -headache -loss of appetite -nausea -stomach upset, indigestion -tremors This list may not describe all possible side effects. Call your doctor for  medical advice about side effects. You may report side effects to FDA at 1-800-FDA-1088. Where should I keep my medicine? Keep out of the reach of children. Store at room temperature between 15 and 30 degrees C (59 and 86 degrees F) in a tightly closed container. Protect from moisture. Throw away any unused medicine after the expiration date. NOTE: This sheet is a summary. It may not cover all possible information. If you have questions about this medicine, talk to your doctor, pharmacist, or health care provider.  2018 Elsevier/Gold Standard (2012-12-31 23:17:57)  Methylprednisolone tablets What is this medicine? METHYLPREDNISOLONE (meth ill pred NISS oh lone) is a corticosteroid. It is commonly used to treat inflammation of the skin, joints, lungs, and other organs. Common conditions treated include asthma, allergies, and arthritis. It is also used for other conditions, such as blood disorders and diseases of the adrenal glands. This medicine may be used for other purposes; ask your health care provider or pharmacist if you have questions. COMMON BRAND NAME(S): Medrol, Medrol Dosepak What should I tell my health care provider before I take this medicine? They need to know if you have any of these conditions: -Cushing's syndrome -eye disease, vision problems -diabetes -glaucoma -heart disease -high blood pressure -infection (especially a virus infection such as chickenpox, cold sores, or herpes) -liver disease -mental illness -myasthenia gravis -osteoporosis -recently received or scheduled to receive a vaccine -seizures -stomach or intestine problems -thyroid disease -an unusual or allergic reaction to lactose, methylprednisolone, other medicines, foods, dyes, or preservatives -pregnant or trying to get pregnant -breast-feeding How should I use this medicine? Take this medicine by mouth with a glass of water. Follow the directions on the prescription label. Take this medicine with  food. If you are taking this medicine once a day, take it in the morning. Do not take it more often than directed. Do not suddenly stop taking your medicine because you may develop a severe reaction. Your doctor will tell you how much medicine to take. If your doctor wants you to stop the medicine, the dose may be slowly lowered over time to avoid any side effects. Talk to your pediatrician regarding the use of this medicine in children. Special care may be needed. Overdosage: If you think you have taken too much of this medicine contact a poison control center or emergency room at once. NOTE: This medicine is only for you. Do not share this medicine with others. What if I miss a dose? If you miss a dose, take it as soon as you can. If it is almost time for your next dose, talk to your doctor or health care professional. You may need to miss a dose or take an extra dose. Do not take double or extra doses without advice. What may interact with this medicine? Do not take this medicine with any of the following medications: -alefacept -echinacea -live virus vaccines -metyrapone -mifepristone This medicine may also interact with the following medications: -amphotericin B -aspirin and aspirin-like medicines -certain antibiotics like erythromycin, clarithromycin, troleandomycin -certain medicines for diabetes -certain medicines for fungal infections like ketoconazole -certain medicines for seizures like carbamazepine, phenobarbital, phenytoin -certain medicines that treat or prevent blood  clots like warfarin -cholestyramine -cyclosporine -digoxin -diuretics -female hormones, like estrogens and birth control pills -isoniazid -NSAIDs, medicines for pain inflammation, like ibuprofen or naproxen -other medicines for myasthenia gravis -rifampin -vaccines This list may not describe all possible interactions. Give your health care provider a list of all the medicines, herbs, non-prescription drugs,  or dietary supplements you use. Also tell them if you smoke, drink alcohol, or use illegal drugs. Some items may interact with your medicine. What should I watch for while using this medicine? Tell your doctor or healthcare professional if your symptoms do not start to get better or if they get worse. Do not stop taking except on your doctor's advice. You may develop a severe reaction. Your doctor will tell you how much medicine to take. This medicine may increase your risk of getting an infection. Tell your doctor or health care professional if you are around anyone with measles or chickenpox, or if you develop sores or blisters that do not heal properly. This medicine may affect blood sugar levels. If you have diabetes, check with your doctor or health care professional before you change your diet or the dose of your diabetic medicine. Tell your doctor or health care professional right away if you have any change in your eyesight. Using this medicine for a long time may increase your risk of low bone mass. Talk to your doctor about bone health. What side effects may I notice from receiving this medicine? Side effects that you should report to your doctor or health care professional as soon as possible: -allergic reactions like skin rash, itching or hives, swelling of the face, lips, or tongue -bloody or tarry stools -changes in vision -hallucination, loss of contact with reality -muscle cramps -muscle pain -palpitations -signs and symptoms of high blood sugar such as dizziness; dry mouth; dry skin; fruity breath; nausea; stomach pain; increased hunger or thirst; increased urination -signs and symptoms of infection like fever or chills; cough; sore throat; pain or trouble passing urine -trouble passing urine or change in the amount of urine Side effects that usually do not require medical attention (report to your doctor or health care professional if they continue or are bothersome): -changes  in emotions or mood -constipation -diarrhea -excessive hair growth on the face or body -headache -nausea, vomiting -trouble sleeping -weight gain This list may not describe all possible side effects. Call your doctor for medical advice about side effects. You may report side effects to FDA at 1-800-FDA-1088. Where should I keep my medicine? Keep out of the reach of children. Store at room temperature between 20 and 25 degrees C (68 and 77 degrees F). Throw away any unused medicine after the expiration date. NOTE: This sheet is a summary. It may not cover all possible information. If you have questions about this medicine, talk to your doctor, pharmacist, or health care provider.  2018 Elsevier/Gold Standard (2015-03-05 15:53:30)   Migraine Headache A migraine headache is an intense, throbbing pain on one side or both sides of the head. Migraines may also cause other symptoms, such as nausea, vomiting, and sensitivity to light and noise. What are the causes? Doing or taking certain things may also trigger migraines, such as:  Alcohol.  Smoking.  Medicines, such as:  Medicine used to treat chest pain (nitroglycerine).  Birth control pills.  Estrogen pills.  Certain blood pressure medicines.  Aged cheeses, chocolate, or caffeine.  Foods or drinks that contain nitrates, glutamate, aspartame, or tyramine.  Physical activity. Other things  that may trigger a migraine include:  Menstruation.  Pregnancy.  Hunger.  Stress, lack of sleep, too much sleep, or fatigue.  Weather changes. What increases the risk? The following factors may make you more likely to experience migraine headaches:  Age. Risk increases with age.  Family history of migraine headaches.  Being Caucasian.  Depression and anxiety.  Obesity.  Being a woman.  Having a hole in the heart (patent foramen ovale) or other heart problems. What are the signs or symptoms? The main symptom of this  condition is pulsating or throbbing pain. Pain may:  Happen in any area of the head, such as on one side or both sides.  Interfere with daily activities.  Get worse with physical activity.  Get worse with exposure to bright lights or loud noises. Other symptoms may include:  Nausea.  Vomiting.  Dizziness.  General sensitivity to bright lights, loud noises, or smells. Before you get a migraine, you may get warning signs that a migraine is developing (aura). An aura may include:  Seeing flashing lights or having blind spots.  Seeing bright spots, halos, or zigzag lines.  Having tunnel vision or blurred vision.  Having numbness or a tingling feeling.  Having trouble talking.  Having muscle weakness. How is this diagnosed? A migraine headache can be diagnosed based on:  Your symptoms.  A physical exam.  Tests, such as CT scan or MRI of the head. These imaging tests can help rule out other causes of headaches.  Taking fluid from the spine (lumbar puncture) and analyzing it (cerebrospinal fluid analysis, or CSF analysis). How is this treated? A migraine headache is usually treated with medicines that:  Relieve pain.  Relieve nausea.  Prevent migraines from coming back. Treatment may also include:  Acupuncture.  Lifestyle changes like avoiding foods that trigger migraines. Follow these instructions at home: Medicines   Take over-the-counter and prescription medicines only as told by your health care provider.  Do not drive or use heavy machinery while taking prescription pain medicine.  To prevent or treat constipation while you are taking prescription pain medicine, your health care provider may recommend that you:  Drink enough fluid to keep your urine clear or pale yellow.  Take over-the-counter or prescription medicines.  Eat foods that are high in fiber, such as fresh fruits and vegetables, whole grains, and beans.  Limit foods that are high in fat  and processed sugars, such as fried and sweet foods. Lifestyle   Avoid alcohol use.  Do not use any products that contain nicotine or tobacco, such as cigarettes and e-cigarettes. If you need help quitting, ask your health care provider.  Get at least 8 hours of sleep every night.  Limit your stress. General instructions    Keep a journal to find out what may trigger your migraine headaches. For example, write down:  What you eat and drink.  How much sleep you get.  Any change to your diet or medicines.  If you have a migraine:  Avoid things that make your symptoms worse, such as bright lights.  It may help to lie down in a dark, quiet room.  Do not drive or use heavy machinery.  Ask your health care provider what activities are safe for you while you are experiencing symptoms.  Keep all follow-up visits as told by your health care provider. This is important. Contact a health care provider if:  You develop symptoms that are different or more severe than your usual migraine  symptoms. Get help right away if:  Your migraine becomes severe.  You have a fever.  You have a stiff neck.  You have vision loss.  Your muscles feel weak or like you cannot control them.  You start to lose your balance often.  You develop trouble walking.  You faint. This information is not intended to replace advice given to you by your health care provider. Make sure you discuss any questions you have with your health care provider. Document Released: 12/27/2004 Document Revised: 07/17/2015 Document Reviewed: 06/15/2015 Elsevier Interactive Patient Education  2017 [employer].  Ondansetron oral dissolving tablet What is this medicine? ONDANSETRON (on DAN se tron) is used to treat nausea and vomiting caused by chemotherapy. It is also used to prevent or treat nausea and vomiting after surgery. This medicine may be used for other purposes; ask your health care provider or pharmacist if  you have questions. COMMON BRAND NAME(S): Zofran ODT What should I tell my health care provider before I take this medicine? They need to know if you have any of these conditions: -heart disease -history of irregular heartbeat -liver disease -low levels of magnesium or potassium in the blood -an unusual or allergic reaction to ondansetron, granisetron, other medicines, foods, dyes, or preservatives -pregnant or trying to get pregnant -breast-feeding How should I use this medicine? These tablets are made to dissolve in the mouth. Do not try to push the tablet through the foil backing. With dry hands, peel away the foil backing and gently remove the tablet. Place the tablet in the mouth and allow it to dissolve, then swallow. While you may take these tablets with water, it is not necessary to do so. Talk to your pediatrician regarding the use of this medicine in children. Special care may be needed. Overdosage: If you think you have taken too much of this medicine contact a poison control center or emergency room at once. NOTE: This medicine is only for you. Do not share this medicine with others. What if I miss a dose? If you miss a dose, take it as soon as you can. If it is almost time for your next dose, take only that dose. Do not take double or extra doses. What may interact with this medicine? Do not take this medicine with any of the following medications: -apomorphine -certain medicines for fungal infections like fluconazole, itraconazole, ketoconazole, posaconazole, voriconazole -cisapride -dofetilide -dronedarone -pimozide -thioridazine -ziprasidone This medicine may also interact with the following medications: -carbamazepine -certain medicines for depression, anxiety, or psychotic disturbances -fentanyl -linezolid -MAOIs like Carbex, Eldepryl, Marplan, Nardil, and Parnate -methylene blue (injected into a vein) -other medicines that prolong the QT interval (cause an  abnormal heart rhythm) -phenytoin -rifampicin -tramadol This list may not describe all possible interactions. Give your health care provider a list of all the medicines, herbs, non-prescription drugs, or dietary supplements you use. Also tell them if you smoke, drink alcohol, or use illegal drugs. Some items may interact with your medicine. What should I watch for while using this medicine? Check with your doctor or health care professional as soon as you can if you have any sign of an allergic reaction. What side effects may I notice from receiving this medicine? Side effects that you should report to your doctor or health care professional as soon as possible: -allergic reactions like skin rash, itching or hives, swelling of the face, lips, or tongue -breathing problems -confusion -dizziness -fast or irregular heartbeat -feeling faint or lightheaded, falls -fever  and chills -loss of balance or coordination -seizures -sweating -swelling of the hands and feet -tightness in the chest -tremors -unusually weak or tired Side effects that usually do not require medical attention (report to your doctor or health care professional if they continue or are bothersome): -constipation or diarrhea -headache This list may not describe all possible side effects. Call your doctor for medical advice about side effects. You may report side effects to FDA at 1-800-FDA-1088. Where should I keep my medicine? Keep out of the reach of children. Store between 2 and 30 degrees C (36 and 86 degrees F). Throw away any unused medicine after the expiration date. NOTE: This sheet is a summary. It may not cover all possible information. If you have questions about this medicine, talk to your doctor, pharmacist, or health care provider.  2018 Elsevier/Gold Standard (2012-10-03 16:21:52)

## 2016-03-14 NOTE — Progress Notes (Addendum)
[facility]    Provider:  Dr Jaynee Eagles Referring Provider: [name], DO Primary Care Physician:  [name], DO  CC:  Headache  HPI:  Carrie Salazar is a 71 y.o. female here as a referral from Dr. Sheppard Coil for headaches. PMHx depression, insomnia (in the past tried Cymbalta and Remeron but had side effects on trazodone now fo rinsomnia), is on Prozac, OSA, Atrial Flutter, CAD s/p bypass graft, chronic back pain, HTN, HLD. She is on gabapentin, lisinopril, fluoxetine, carvedilol (which are used in migraine management). She has a PMHx in the 1990s of being evaluated at [facility] for headaches, abnormal MRI brain and placed on Diamox. Recently was on Diamox with hypotension when the Diamox was stopped the headaches returned. Dr. Sheppard Coil has been trying to get her an MRI of the brain but ran into difficulties due to back implant and patient's allergy to IV contrast. She has had constant headaches for 2 weeks. She fell and hit her head and several days later the headaches started. Her head was sore for days, almost knocked herself out. Headaches are behind her eyes, she can;t stand light and noise and her neck hurts. The headaches are continuously. She was diagnosed with OSA and is not wearing a cpap at night she was placed on oxygen she had a difficult time on the cpap. She has morning headaches. She was given diamox which helped. She wants to go ointo a dark room, nausea and dizzy, sound sensitivity. In the 1990s she vomited, diagnosed her with migraines. It is throbbing, pulsating. She has blurry vision.   Meds: gabapentin, lisinopril, fluoxetine, carvedilol, cymbalta  Reviewed notes, labs and imaging from outside physicians, which showed: Patient was seen by pcp 02/25/2016 for headache. She previously was on Diamox for headaches, had an abnormal MRI brain and possibly intracranial HTN, no records available. Headaches started 4-6 weeks previously. Has vision changes in the R  eye, dizziness, hearing changes. patient states headaches exactly the same as they were in the 1990s when she was on diamox, pcp suspects NPH and pseudotumor cerebri. An MRI of the brain was ordered.   She was seen in the ED 2/19 for occipital headaches and behind both eyes onset 3 weeks prior, throbbing. She fell and hit he head in the occiput before the headaches began. She was started on Diamox and prednisone for pretreatment of MRI by her pcp.The ED noted her optic disks were sharp, acuity 20/40 OS and 20/40 OD. Headaches improved after IV Reglan. Diamox was stopped. Creatinine was 1.65, diagnosed with cluster headaches and mild renal insufficiency.   She was seen for headache follow up 2/27, had hypotension due to Diamox overuse. Headaches were resolved on that and the dizzy/lightheadedness improved with discontinuation of the medication. Headaches are back since stopping the diamox however. MRI is scheduled at [facility] due to IV allergy and back implant accommodation.    CBC normal, CMP with creatinine .99, BUN 16 01/25/2016  Personally reviewed images CT scan and agree with the following:  FINDINGS: Brain: No evidence of acute infarction, hemorrhage, hydrocephalus, extra-axial collection or mass lesion/mass effect.  Prominence of the ventricles and sulci reflects mild cortical volume loss. Mild cerebellar atrophy is noted. Scattered periventricular and subcortical white matter change likely reflects small vessel ischemic microangiopathy.  The brainstem and fourth ventricle are within normal limits. The basal ganglia are unremarkable in appearance. The cerebral hemispheres demonstrate grossly normal gray-white differentiation. No mass effect or midline shift is seen.  Vascular: No hyperdense  vessel or unexpected calcification.  Skull: There is no evidence of fracture; visualized osseous structures are unremarkable in appearance.  Sinuses/Orbits: The orbits are within normal  limits. The paranasal sinuses and mastoid air cells are well-aerated.  Other: No significant soft tissue abnormalities are seen.  IMPRESSION: 1. No evidence of traumatic intracranial injury or fracture. 2. Mild cortical volume loss and scattered small vessel ischemic microangiopathy.  Review of Systems: Patient complains of symptoms per HPI as well as the following symptoms: no CP, no SOB. Pertinent negatives per HPI. All others negative.   Social History   Social History  . Marital status: Married    Spouse name: N/A  . Number of children: 2  . Years of education: N/A   Occupational History  . Not on file.   Social History Main Topics  . Smoking status: Former Research scientist (life sciences)  . Smokeless tobacco: Never Used  . Alcohol use Yes     Comment: 1 q wk  . Drug use: No  . Sexual activity: Not on file   Other Topics Concern  . Not on file   Social History Narrative  . No narrative on file    Family History  Problem Relation Age of Onset  . Cancer Mother   . Hyperlipidemia Father   . Hypertension Father   . Stroke Father   . Heart attack Father   . Hyperlipidemia Maternal Grandmother   . Hypertension Maternal Grandmother   . Stroke Maternal Grandmother     Past Medical History:  Diagnosis Date  . Heart attack   . Hx of CABG   . Hyperlipidemia   . Hypertension     Past Surgical History:  Procedure Laterality Date  . ABDOMINAL HYSTERECTOMY    . APPENDECTOMY    . BACK SURGERY    . BLADDER SURGERY    . BREAST LUMPECTOMY    . CORONARY ARTERY BYPASS GRAFT    . LYMPECTOMY  1980  . TONSILLECTOMY      Current Outpatient Prescriptions  Medication Sig Dispense Refill  . aspirin 81 MG tablet Take 81 mg by mouth daily.    Marland Kitchen aspirin-acetaminophen-caffeine (EXCEDRIN MIGRAINE) 250-250-65 MG tablet Take 1-2 tablets by mouth every 6 (six) hours as needed for headache. 30 tablet 0  . carvedilol (COREG) 12.5 MG tablet Take 12.5 mg by mouth 2 (two) times daily.  0  .  diphenhydrAMINE (BENADRYL) 25 mg capsule Take 1 capsule (25 mg total) by mouth every 6 (six) hours as needed. 30 capsule 0  . estradiol (ESTRACE VAGINAL) 0.1 MG/GM vaginal cream Place 3.9-0 Applicatorfuls vaginally 3 (three) times a week. 42.5 g 12  . FLUoxetine (PROZAC) 40 MG capsule Take 1 capsule (40 mg total) by mouth daily. 30 capsule 1  . gabapentin (NEURONTIN) 600 MG tablet TAKE 1 TABLET( 600 MG TOTAL) BY MOUTH IN THE MORNING AND AFTERNOON. IN EVENING CAN TAKE 2 TABLETS( 1200 MG TOTAL) 120 tablet 3  . ipratropium (ATROVENT HFA) 17 MCG/ACT inhaler Inhale 2 puffs into the lungs every 6 (six) hours as needed for wheezing. 1 Inhaler 1  . isosorbide mononitrate (IMDUR) 60 MG 24 hr tablet Take 1 tablet (60 mg total) by mouth daily. 90 tablet 3  . lisinopril (PRINIVIL,ZESTRIL) 10 MG tablet Take 10 mg by mouth daily.  1  . lisinopril (PRINIVIL,ZESTRIL) 10 MG tablet TAKE 1 TABLET BY MOUTH TWO  TIMES DAILY 180 tablet 0  . nitroGLYCERIN (NITROSTAT) 0.4 MG SL tablet Place 0.4 mg under the tongue every 5 (  five) minutes as needed for chest pain.    . pravastatin (PRAVACHOL) 20 MG tablet Take 1 tablet (20 mg total) by mouth daily. 90 tablet 3  . predniSONE (DELTASONE) 50 MG tablet Take one tablet 13 hours before MRI, one tablet 7 hours before MRI, one tablet 1 hour before MRI. Take 11m benadryl 1 hour before MRI. 3 tablet 0  . ranolazine (RANEXA) 500 MG 12 hr tablet Take 1 tablet (500 mg total) by mouth 2 (two) times daily. 90 tablet 2  . traZODone (DESYREL) 100 MG tablet Take 0.5-1 tablets (50-100 mg total) by mouth at bedtime as needed for sleep. 30 tablet 1  . Vitamin D, Ergocalciferol, (DRISDOL) 50000 units CAPS capsule Take 1 capsule (50,000 Units total) by mouth every 7 (seven) days. 12 capsule 1   No current facility-administered medications for this visit.     Allergies as of 03/14/2016 - Review Complete 02/29/2016  Allergen Reaction Noted  . Albuterol Palpitations and Other (See Comments)  12/23/2015  . Dilaudid [hydromorphone] Anaphylaxis 11/04/2015  . Ivp dye [iodinated diagnostic agents] Other (See Comments) 08/07/2015  . Metrizamide Other (See Comments), Itching, Rash, and Hives 08/07/2015  . Nitrofuran derivatives Itching 10/07/2015  . Nitrofurantoin Itching 10/07/2015  . Percocet [oxycodone-acetaminophen] Itching 09/24/2015  . Sulfa antibiotics Hives and Other (See Comments) 11/04/2015  . Tape Itching 11/24/2015  . Levaquin [levofloxacin in d5w] Other (See Comments) 08/07/2015  . Codeine Itching and Hives 08/07/2015  . Lanolin Hives 02/05/2016  . Latex Other (See Comments) 02/02/2016  . Percodan [oxycodone-aspirin]  08/07/2015  . Sulfasalazine Hives 11/04/2015  . Nickel Rash 02/02/2016  . Penicillins Hives 08/07/2015  . Sulfamethoxazole Rash and Hives 11/24/2015    Vitals: There were no vitals taken for this visit. Last Weight:  Wt Readings from Last 1 Encounters:  03/08/16 211 lb (95.7 kg)   Last Height:   Ht Readings from Last 1 Encounters:  02/29/16 _0  (1.651 m)    Physical exam: Exam: Gen: NAD, conversant, well nourised, obese, well groomed                     CV: RRR, no MRG. No Carotid Bruits. No peripheral edema, warm, nontender Eyes: Conjunctivae clear without exudates or hemorrhage  Neuro: Detailed Neurologic Exam  Speech:    Speech is normal; fluent and spontaneous with normal comprehension.  Cognition:    The patient is oriented to person, place, and time;     recent and remote memory intact;     language fluent;     normal attention, concentration,     fund of knowledge Cranial Nerves:    The pupils are equal, round, and reactive to light. The fundi are normal and spontaneous venous pulsations are present. Visual fields are full to finger confrontation. Extraocular movements are intact. Trigeminal sensation is intact and the muscles of mastication are normal. The face is symmetric. The palate elevates in the midline. Hearing intact.  Voice is normal. Shoulder shrug is normal. The tongue has normal motion without fasciculations.   Coordination:    Normal finger to nose and heel to shin. Normal rapid alternating movements.   Gait:    Antalgic gait with a cane  Motor Observation:    No asymmetry, no atrophy, and no involuntary movements noted. Tone:    Normal muscle tone.    Posture:    Posture is normal. normal erect    Strength: right leg mild prox weakness otherwise strength is V/V in the  upper and lower limbs.      Sensation: intact to LT     Reflex Exam:  DTR's:    Deep tendon reflexes in the upper and lower extremities are normal bilaterally.   Toes:    The toes are downgoing bilaterally.   Clonus:    Clonus is absent.      Assessment/Plan:  71 year old with a PMHx of remote migraines and cluster headaches. She has had 2 weeks of headaches behind her eyes, she can;t stand light and noise and her neck hurts. The headaches are continuous since falling and hitting her head very hard. She was diagnosed with OSA and is not wearing a cpap at night she was placed on oxygen in the past because she had a difficult time on the cpap mask. She has morning headaches. She was given diamox which helped but experienced hypotension. With the headaches, she wants to go ointo a dark room, associated nausea and dizzy, light and sound sensitivity,  and dizziness, throbbing/pulsating quality. In the 1990s was diagnosed her with migraines. This may be migraines exacerbated by her head trauma.  I do highly recommend MRI of the brain and working with Dr. Sheppard Coil to have that done and if any signs of intracranial hypertension then we can order an LP. Will start Topiramate which is similar in action to Diamox but not as strong, she still should watch her BP as well as a steroid taper medrol dosepak. Will check ESR and CRP for temporal arteritis. Will order sleep eval here to see if we can get her in sooner than May for sleep  eval.  Orders Placed This Encounter  Procedures  . Sedimentation rate  . C-reactive protein  . Basic Metabolic Panel  . Ambulatory referral to Sleep Studies    As far as your medications are concerned, I would like to suggest:  Medrol Dosepak Start Topiramate 36m at night for 3 days then increase to 545m(2 pills) at night Zofran 3x a day for nausea and dizziness Medrol dosepak take pills every day in the morning with food  As far as diagnostic testing: MRI brain ([facility] Al[facility] Coile[facility])   Cc: Dr. AlVinetta BergamoMDYarmouth [facility] [address] [place] [address]Phone [phone]

## 2016-03-15 LAB — BASIC METABOLIC PANEL
BUN/Creatinine Ratio: 23 (ref 12–28)
BUN: 21 mg/dL (ref 8–27)
CHLORIDE: 100 mmol/L (ref 96–106)
CO2: 25 mmol/L (ref 18–29)
Calcium: 9.9 mg/dL (ref 8.7–10.3)
Creatinine, Ser: 0.9 mg/dL (ref 0.57–1.00)
GFR calc Af Amer: 74 mL/min/{1.73_m2} (ref >59)
GFR calc non Af Amer: 64 mL/min/{1.73_m2} (ref >59)
GLUCOSE: 81 mg/dL (ref 65–99)
POTASSIUM: 5.4 mmol/L — AB (ref 3.5–5.2)
Sodium: 140 mmol/L (ref 134–144)

## 2016-03-15 LAB — C-REACTIVE PROTEIN: CRP: 5.5 mg/L — AB (ref 0.0–4.9)

## 2016-03-15 LAB — SEDIMENTATION RATE: SED RATE: 6 mm/h (ref 0–40)

## 2016-03-15 NOTE — Telephone Encounter (Signed)
Called pt w/ MRI telephone number as requested.

## 2016-03-17 ENCOUNTER — Telehealth: Payer: Self-pay | Admitting: *Deleted

## 2016-03-17 NOTE — Telephone Encounter (Signed)
Per Dr Jaynee Eagles, spoke with patient and informed her that her lab results are unremarkable. She questioned dif her kidney function was improved from when she was in the ER. Advised her that the BUN and creatinine are within normal limits now. She verbalized understanding, appreciation.

## 2016-03-20 ENCOUNTER — Other Ambulatory Visit: Payer: Self-pay | Admitting: Osteopathic Medicine

## 2016-03-20 DIAGNOSIS — F331 Major depressive disorder, recurrent, moderate: Secondary | ICD-10-CM

## 2016-03-21 ENCOUNTER — Other Ambulatory Visit: Payer: Self-pay | Admitting: Osteopathic Medicine

## 2016-03-21 DIAGNOSIS — F331 Major depressive disorder, recurrent, moderate: Secondary | ICD-10-CM

## 2016-03-23 ENCOUNTER — Other Ambulatory Visit: Payer: Self-pay | Admitting: Osteopathic Medicine

## 2016-03-23 ENCOUNTER — Ambulatory Visit (HOSPITAL_COMMUNITY)
Admission: RE | Admit: 2016-03-23 | Discharge: 2016-03-23 | Disposition: A | Discharge status: Home / Self Care | Payer: Medicare Other | Source: Ambulatory Visit | Attending: Osteopathic Medicine | Admitting: Osteopathic Medicine

## 2016-03-23 DIAGNOSIS — I6782 Cerebral ischemia: Secondary | ICD-10-CM | POA: Insufficient documentation

## 2016-03-23 DIAGNOSIS — G4489 Other headache syndrome: Secondary | ICD-10-CM | POA: Diagnosis present

## 2016-03-23 DIAGNOSIS — G43909 Migraine, unspecified, not intractable, without status migrainosus: Secondary | ICD-10-CM | POA: Diagnosis not present

## 2016-03-23 MED ORDER — GADOBENATE DIMEGLUMINE 529 MG/ML IV SOLN: 20.0000 mL | Freq: Once | INTRAVENOUS | Status: DC | PRN

## 2016-03-23 MED ORDER — GADOBENATE DIMEGLUMINE 529 MG/ML IV SOLN
20.0000 mL | Freq: Once | INTRAVENOUS | Status: AC | PRN
  Administered 2016-03-23: 20 mL via INTRAVENOUS

## 2016-03-24 ENCOUNTER — Other Ambulatory Visit: Payer: Self-pay | Admitting: Neurology

## 2016-03-25 ENCOUNTER — Ambulatory Visit (INDEPENDENT_AMBULATORY_CARE_PROVIDER_SITE_OTHER): Payer: Medicare Other | Admitting: Osteopathic Medicine

## 2016-03-25 VITALS — BP 110/65 | HR 77 | Wt 207.0 lb

## 2016-03-25 DIAGNOSIS — I959 Hypotension, unspecified: Secondary | ICD-10-CM

## 2016-03-25 DIAGNOSIS — Z1329 Encounter for screening for other suspected endocrine disorder: Secondary | ICD-10-CM | POA: Diagnosis not present

## 2016-03-25 DIAGNOSIS — G4489 Other headache syndrome: Secondary | ICD-10-CM | POA: Diagnosis not present

## 2016-03-25 MED ORDER — LISINOPRIL 5 MG PO TABS: 5.0000 mg | ORAL_TABLET | Freq: Every day | ORAL | 0 refills | Status: DC

## 2016-03-25 NOTE — Progress Notes (Signed)
HPI: Carrie Salazar is a 71 y.o. female  who presents to [facility] today, 03/25/16,  for chief complaint of:  Chief Complaint  Patient presents with  . f/u MRI    Headaches: Finally got MRI done for further evaluation of headaches, there is some concern for abnormal MRI in the past at [facility] but no records were available. There was some delay getting MRI done due to device implant. MRI images were personally reviewed with the patient and results were explained in detail. Chronic microvascular changes fairly mild for age but no significant hydrocephalus or signs of increased pressure. Neurology has reviewed these results as well.   Patient states that headache is a bit better but dizziness is still a problem for her. She is significantly hypotensive again today, recent records reviewed show patient is normotensive or hypertensive at some other visits. She states that she is getting low blood pressure readings at home and is having some symptoms of orthostatic hypotension. She is not sure when she was instructed to follow-up with cardiology, she states that they were supposed to give her a call but she never heard back. Patient is on beta blocker for history of flutter, on beta blocker and ACE inhibitor for history of MI   Past medical history, surgical history, social history and family history reviewed.  Patient Active Problem List   Diagnosis Date Noted  . Other headache syndrome 02/25/2016  . SOB (shortness of breath) 01/25/2016  . Anemia 01/25/2016  . Vaginal lesion 2020-01-815  . Moderate episode of recurrent major depressive disorder ([place]) 12/10/2015  . Female cystocele 10/16/2015  . Urinary urgency 10/16/2015  . History of obstructive sleep apnea 08/20/2015  . Atrial flutter ([place]) 08/20/2015  . 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 Chronic back pain 08/13/2015  .  Recurrent UTI 08/13/2015  . Insomnia 08/13/2015  . Hyperhidrosis 08/13/2015    Current medication list and allergy/intolerance information reviewed.   Current Outpatient Prescriptions on File Prior to Visit  Medication Sig Dispense Refill  . aspirin 81 MG tablet Take 162 mg by mouth daily.     . carvedilol (COREG) 12.5 MG tablet Take 12.5 mg by mouth 2 (two) times daily.  0  . diphenhydrAMINE (BENADRYL) 25 mg capsule Take 1 capsule (25 mg total) by mouth every 6 (six) hours as needed. 30 capsule 0  . estradiol (ESTRACE VAGINAL) 0.1 MG/GM vaginal cream Place 8.7-5 Applicatorfuls vaginally 3 (three) times a week. 42.5 g 12  . FLUoxetine (PROZAC) 40 MG capsule Take 1 capsule (40 mg total) by mouth daily. APPOINTMENT NEEDED FOR FURTHER REFILLS 30 capsule 0  . gabapentin (NEURONTIN) 600 MG tablet TAKE 1 TABLET( 600 MG TOTAL) BY MOUTH IN THE MORNING AND AFTERNOON. IN EVENING CAN TAKE 2 TABLETS( 1200 MG TOTAL) 120 tablet 3  . ipratropium (ATROVENT HFA) 17 MCG/ACT inhaler Inhale 2 puffs into the lungs every 6 (six) hours as needed for wheezing. 1 Inhaler 1  . isosorbide mononitrate (IMDUR) 60 MG 24 hr tablet Take 1 tablet (60 mg total) by mouth daily. 90 tablet 3  . lisinopril (PRINIVIL,ZESTRIL) 10 MG tablet TAKE 1 TABLET BY MOUTH TWO  TIMES DAILY 180 tablet 0  . methylPREDNISolone (MEDROL DOSEPAK) 4 MG TBPK tablet follow package directions 21 tablet 0  . nitroGLYCERIN (NITROSTAT) 0.4 MG SL tablet Place 0.4 mg under the tongue every 5 (five) minutes as needed for chest pain.    Marland Kitchen  ondansetron (ZOFRAN ODT) 4 MG disintegrating tablet Take 1 tablet (4 mg total) by mouth every 8 (eight) hours as needed for nausea or dizziness. 30 tablet 6  . oxymetazoline (AFRIN) 0.05 % nasal spray Place into the nose.    . pravastatin (PRAVACHOL) 20 MG tablet Take 1 tablet (20 mg total) by mouth daily. 90 tablet 3  . predniSONE (DELTASONE) 50 MG tablet Take one tablet 13 hours before MRI, one tablet 7 hours before MRI, one  tablet 1 hour before MRI. Take 50mg  benadryl 1 hour before MRI. 3 tablet 0  . ranolazine (RANEXA) 500 MG 12 hr tablet Take 1 tablet (500 mg total) by mouth 2 (two) times daily. 90 tablet 2  . topiramate (TOPAMAX) 25 MG tablet Take 2 tablets (50 mg total) by mouth at bedtime. 60 tablet 6  . Vitamin D, Ergocalciferol, (DRISDOL) 50000 units CAPS capsule Take 1 capsule (50,000 Units total) by mouth every 7 (seven) days. 12 capsule 1   No current facility-administered medications on file prior to visit.    Allergies  Allergen Reactions  . Albuterol Palpitations and Other (See Comments)    HALLUCINATIONS Also feels "funny in the head"  . Dilaudid [Hydromorphone] Anaphylaxis    Caused patient to have heart attack and crazy in the head  . Ivp Dye [Iodinated Diagnostic Agents] Other (See Comments)    Itching and causes me to pass out  . Metrizamide Other (See Comments), Itching, Rash and Hives    Itching and causes me to pass out  . Nitrofuran Derivatives Itching  . Nitrofurantoin Itching  . Percocet [Oxycodone-Acetaminophen] Itching  . Sulfa Antibiotics Hives and Other (See Comments)    Itching and causes me to pass out**PT CAN TAKE BENADRYL 2 HOURS PRIOR AND IS OK**   . Tape Itching  . Levaquin [Levofloxacin In D5w] Other (See Comments)    "I get real dizzy and pass out"  "I get real dizzy and pass out"   . Codeine Itching and Hives  . Diamox [Acetazolamide] Other (See Comments)    Low BP, dizziness  . Lanolin Hives    "eats skin off"  . Latex Other (See Comments)    LEAVES REALLY BAD BURN ON SKIN   . Percodan [Oxycodone-Aspirin]   . Sulfasalazine Hives  . Nickel Rash  . Penicillins Hives  . Sulfamethoxazole Rash and Hives      Review of Systems:  Constitutional: No recent illness, no fever or chills  HEENT: +headache, no vision change  Cardiac: No  chest pain, No  pressure, No palpitations  Respiratory:  No  shortness of breath. No  Cough  Gastrointestinal: No  abdominal  pain, no change on bowel habits  Musculoskeletal: No new myalgia/arthralgia  GU: No urinary frequency, no dysuria  Skin: No  Rash  Neurologic: +Generalized but no focal weakness, + postural Dizziness   Exam:  BP 110/65   Pulse 77   Wt 207 lb (93.9 kg)   BMI 34.45 kg/m blood pressure a bit better on manual recheck  Constitutional: VS see above. General Appearance: alert, well-developed, well-nourished, NAD  Eyes: Normal lids and conjunctive, non-icteric sclera  Ears, Nose, Mouth, Throat: MMM, Normal external inspection ears/nares/mouth/lips/gums.  Neck: No masses, trachea midline.   Respiratory: Normal respiratory effort. no wheeze, no rhonchi, no rales  Cardiovascular: S1/S2 normal, no murmur, no rub/gallop auscultated. RRR.   Musculoskeletal: Gait normal. Symmetric and independent movement of all extremities  Neurological: Normal balance/coordination. No tremor.  Skin: warm, dry, intact.  Psychiatric: Normal judgment/insight. Normal mood and affect. Oriented x3.      Mr Jeri Cos Wo Contrast  Result Date: 03/23/2016 CLINICAL DATA:  Initial valuation for cluster migraines and nausea. Recently fell backwards and hit head approximately 10 weeks ago. EXAM: MRI HEAD WITHOUT AND WITH CONTRAST TECHNIQUE: Multiplanar, multiecho pulse sequences of the brain and surrounding structures were obtained without and with intravenous contrast. CONTRAST:  20 cc of MultiHance. COMPARISON:  Prior CT from 02/29/2016. FINDINGS: Brain: Generalized age-related cerebral atrophy present. Mild patchy T2/FLAIR hyperintensity within the periventricular and deep white matter, most like related chronic small vessel ischemic disease. Chronic microvascular ischemic changes present within the pons as well. Overall, changes are mild for age. No abnormal foci of restricted diffusion to suggest acute or subacute ischemia. Gray-white matter differentiation is maintained. No areas of encephalomalacia to suggest  chronic infarction. No foci of susceptibility artifact to suggest acute or chronic intracranial hemorrhage. No mass lesion, midline shift, or mass effect. Ventricles are normal in size without evidence for hydrocephalus. No extra-axial fluid collection. Major dural sinuses are grossly patent. No abnormal enhancement. Pituitary gland and suprasellar region within normal limits. Midline structures intact and normal. Vascular: Major intracranial vascular flow voids well maintained. Skull and upper cervical spine: Craniocervical junction within normal limits. Visualized upper cervical spine unremarkable. Bone marrow signal intensity within normal limits. No scalp soft tissue abnormality. Sinuses/Orbits: Globes and orbital soft tissues within normal limits. Patient is status post lens extraction bilaterally. Minimal retained secretions within the left sphenoid sinus. Paranasal sinuses are otherwise clear. No mastoid effusion. Inner ear structures within normal limits. Other: No other significant finding. IMPRESSION: 1. No acute or reversible intracranial process identified. 2. Mild for age chronic microvascular ischemic disease. 3. Otherwise normal brain MRI. Electronically Signed   By: Jeannine Boga M.D.   On: 03/23/2016 14:27       ASSESSMENT/PLAN: The primary encounter diagnosis was Other headache syndrome. A diagnosis of Hypotension, unspecified hypotension type was also pertinent to this visit. MRI results reviewed in detail with the patient. Hypotension exacerbating chronic dizziness but patient overall feels about the same. Close follow-up next week. We are transitioning to a minimal dose of lisinopril and maintaining carvedilol due to concern for history of arrhythmia, see if we can get opinion from cardiology. Patient has no symptoms of infection or reasons for blood loss at this point but I called patient and asked her to come back to get labs done for reassurance and to recheck potassium, left  voice mail. Hemoglobin was 13 less than 1 month ago, recent BMP normal except for slightly increased potassium. Also updated her that Dr. Lavell Anchors had reviewed the MRI and no major concerns, still trying to get set up for sleep study.   Patient Instructions  1. See about getting in to follow up with Dr Jaynee Eagles a bit sooner, otherwise keep scheduled appt 2. Cut back on the Lisinopril to 5 mg per day and we will plan to recheck the blood pressure in one week - bring BP monitor to your next visit here 3. Call Dr Jacalyn Lefevre office to confirm follow-up plan, let them know your blood pressure has been running low   Follow-up plan: Return in about 1 week (around 04/01/2016) for BP recheck . Sooner if needed.  Visit summary with medication list and pertinent instructions was printed for patient to review, alert [place] if any changes needed. All questions at time of visit were answered - patient instructed to contact office with  any additional concerns. ER/RTC precautions were reviewed with the patient and understanding verbalized.

## 2016-03-25 NOTE — Patient Instructions (Addendum)
1. See about getting in to follow up with Dr Jaynee Eagles a bit sooner, otherwise keep scheduled appt 2. Cut back on the Lisinopril to 5 mg per day and we will plan to recheck the blood pressure in one week - bring BP monitor to your next visit here 3. Call Dr Jacalyn Lefevre office to confirm follow-up plan, let them know your blood pressure has been running low

## 2016-03-26 LAB — CBC WITH DIFFERENTIAL/PLATELET
BASOS ABS: 0 {cells}/uL (ref 0–200)
Basophils Relative: 0 %
EOS PCT: 2 %
Eosinophils Absolute: 144 cells/uL (ref 15–500)
HCT: 38.6 % (ref 35.0–45.0)
HEMOGLOBIN: 12.5 g/dL (ref 11.7–15.5)
LYMPHS ABS: 2520 {cells}/uL (ref 850–3900)
Lymphocytes Relative: 35 %
MCH: 30.6 pg (ref 27.0–33.0)
MCHC: 32.4 g/dL (ref 32.0–36.0)
MCV: 94.6 fL (ref 80.0–100.0)
MPV: 8.8 fL (ref 7.5–12.5)
Monocytes Absolute: 648 cells/uL (ref 200–950)
Monocytes Relative: 9 %
NEUTROS PCT: 54 %
Neutro Abs: 3888 cells/uL (ref 1500–7800)
Platelets: 260 10*3/uL (ref 140–400)
RBC: 4.08 MIL/uL (ref 3.80–5.10)
RDW: 14.2 % (ref 11.0–15.0)
WBC: 7.2 10*3/uL (ref 3.8–10.8)

## 2016-03-26 LAB — COMPLETE METABOLIC PANEL WITH GFR
ALBUMIN: 3.8 g/dL (ref 3.6–5.1)
ALK PHOS: 49 U/L (ref 33–130)
ALT: 15 U/L (ref 6–29)
AST: 14 U/L (ref 10–35)
BUN: 27 mg/dL — ABNORMAL HIGH (ref 7–25)
CO2: 24 mmol/L (ref 20–31)
Calcium: 8.7 mg/dL (ref 8.6–10.4)
Chloride: 102 mmol/L (ref 98–110)
Creat: 1.48 mg/dL — ABNORMAL HIGH (ref 0.60–0.93)
GFR, EST AFRICAN AMERICAN: 40 mL/min — AB (ref >=60)
GFR, EST NON AFRICAN AMERICAN: 35 mL/min — AB (ref >=60)
Glucose, Bld: 93 mg/dL (ref 65–99)
POTASSIUM: 4.6 mmol/L (ref 3.5–5.3)
Sodium: 136 mmol/L (ref 135–146)
Total Bilirubin: 0.3 mg/dL (ref 0.2–1.2)
Total Protein: 6.3 g/dL (ref 6.1–8.1)

## 2016-03-26 LAB — TSH: TSH: 2.22 mIU/L

## 2016-03-28 ENCOUNTER — Telehealth: Payer: Self-pay | Admitting: Neurology

## 2016-03-28 NOTE — Telephone Encounter (Signed)
Patient called in reference to headaches.  Patient states she has been taking topiramate (TOPAMAX) 25 MG tablet at night time with good results.  Patient says she is having headaches thru out the day along with dizziness has been taking ondansetron (ZOFRAN ODT) 4 MG disintegrating tablet with no relief.  Patient would like to know if there is something she is able to take during the day to help with headaches.  Pharmacy-  [facility].  Please call

## 2016-03-28 NOTE — Telephone Encounter (Signed)
If she is just in 25mg  qhs have her go up to 50mg . If she is on 50mg  qhs have her go to 100mg . thanks

## 2016-03-29 MED ORDER — TOPIRAMATE 100 MG PO TABS: 100.0000 mg | ORAL_TABLET | Freq: Every day | ORAL | 11 refills | Status: DC

## 2016-03-29 NOTE — Addendum Note (Signed)
Addended by: Monte Fantasia on: 03/29/2016 02:03 PM   Modules accepted: Orders

## 2016-03-29 NOTE — Telephone Encounter (Signed)
New rx Topamax 100 mg tabs qhs e-scribed to pharmacy. Pt notified via TC.

## 2016-03-31 ENCOUNTER — Ambulatory Visit (INDEPENDENT_AMBULATORY_CARE_PROVIDER_SITE_OTHER): Payer: Medicare Other | Admitting: Osteopathic Medicine

## 2016-03-31 ENCOUNTER — Encounter: Payer: Self-pay | Admitting: Osteopathic Medicine

## 2016-03-31 VITALS — BP 122/70 | HR 82 | Wt 209.0 lb

## 2016-03-31 DIAGNOSIS — R82998 Other abnormal findings in urine: Secondary | ICD-10-CM

## 2016-03-31 DIAGNOSIS — I998 Other disorder of circulatory system: Secondary | ICD-10-CM | POA: Diagnosis not present

## 2016-03-31 DIAGNOSIS — R7989 Other specified abnormal findings of blood chemistry: Secondary | ICD-10-CM | POA: Diagnosis not present

## 2016-03-31 DIAGNOSIS — G4489 Other headache syndrome: Secondary | ICD-10-CM

## 2016-03-31 DIAGNOSIS — R8299 Other abnormal findings in urine: Secondary | ICD-10-CM

## 2016-03-31 MED ORDER — LISINOPRIL 5 MG PO TABS: 5.0000 mg | ORAL_TABLET | Freq: Two times a day (BID) | ORAL | 0 refills | Status: DC

## 2016-03-31 NOTE — Progress Notes (Signed)
HPI: Carrie Salazar is a 71 y.o. female  who presents to [facility] today, 03/31/16,  for chief complaint of:  Chief Complaint  Patient presents with  . Follow-up    blood pressure    BP: de-escalated anti-HTN Rx at last visit d/t borderline hypotension and persistent dizziness. Here today for BP check. Pt has been taking medications as below. 137/79 - her home BP cuff reads higher than ours by >10 mmHg. Patient was instructed to be taking lisinopril 5 mg once daily but she has been taking it twice per day. Overall, feels like she is doing a lot better.  Headaches: Topamax increased recently by neurology, patient has been at 100 mg daily at bedtime for a few days, headaches and dizziness have not resolved but have improved.  Renal function: Creatinine and GFR have infiltrating over the past few months. Patient has some concerns also since her GYN told her she may have some sort of autoimmune problem that is affecting her healing from a vaginal surgery    Past medical history, surgical history, social history and family history reviewed.  Patient Active Problem List   Diagnosis Date Noted  . Other headache syndrome 02/25/2016  . SOB (shortness of breath) 01/25/2016  . Anemia 01/25/2016  . Vaginal lesion 2020-04-3015  . Moderate episode of recurrent major depressive disorder ([place]) 12/10/2015  . Female cystocele 10/16/2015  . Urinary urgency 10/16/2015  . History of obstructive sleep apnea 08/20/2015  . Atrial flutter ([place]) 08/20/2015  . 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 Chronic back pain 08/13/2015  . Recurrent UTI 08/13/2015  . Insomnia 08/13/2015  . Hyperhidrosis 08/13/2015    Current medication list and allergy/intolerance information reviewed.   Current Outpatient Prescriptions on File Prior to Visit  Medication Sig Dispense Refill  . aspirin 81 MG tablet  Take 162 mg by mouth daily.     . carvedilol (COREG) 12.5 MG tablet Take 12.5 mg by mouth 2 (two) times daily.  0  . diphenhydrAMINE (BENADRYL) 25 mg capsule Take 1 capsule (25 mg total) by mouth every 6 (six) hours as needed. 30 capsule 0  . estradiol (ESTRACE VAGINAL) 0.1 MG/GM vaginal cream Place 5.0-9 Applicatorfuls vaginally 3 (three) times a week. 42.5 g 12  . FLUoxetine (PROZAC) 40 MG capsule Take 1 capsule (40 mg total) by mouth daily. APPOINTMENT NEEDED FOR FURTHER REFILLS 30 capsule 0  . gabapentin (NEURONTIN) 600 MG tablet TAKE 1 TABLET( 600 MG TOTAL) BY MOUTH IN THE MORNING AND AFTERNOON. IN EVENING CAN TAKE 2 TABLETS( 1200 MG TOTAL) 120 tablet 3  . ipratropium (ATROVENT HFA) 17 MCG/ACT inhaler Inhale 2 puffs into the lungs every 6 (six) hours as needed for wheezing. 1 Inhaler 1  . isosorbide mononitrate (IMDUR) 60 MG 24 hr tablet Take 1 tablet (60 mg total) by mouth daily. 90 tablet 3  . lisinopril (PRINIVIL,ZESTRIL) 5 MG tablet Take 1 tablet (5 mg total) by mouth daily. 90 tablet 0  . methylPREDNISolone (MEDROL DOSEPAK) 4 MG TBPK tablet follow package directions 21 tablet 0  . nitroGLYCERIN (NITROSTAT) 0.4 MG SL tablet Place 0.4 mg under the tongue every 5 (five) minutes as needed for chest pain.    Marland Kitchen ondansetron (ZOFRAN ODT) 4 MG disintegrating tablet Take 1 tablet (4 mg total) by mouth every 8 (eight) hours as needed for nausea or dizziness. 30 tablet 6  . oxymetazoline (AFRIN) 0.05 % nasal spray  Place into the nose.    . pravastatin (PRAVACHOL) 20 MG tablet Take 1 tablet (20 mg total) by mouth daily. 90 tablet 3  . predniSONE (DELTASONE) 50 MG tablet Take one tablet 13 hours before MRI, one tablet 7 hours before MRI, one tablet 1 hour before MRI. Take 50mg  benadryl 1 hour before MRI. 3 tablet 0  . ranolazine (RANEXA) 500 MG 12 hr tablet Take 1 tablet (500 mg total) by mouth 2 (two) times daily. 90 tablet 2  . topiramate (TOPAMAX) 100 MG tablet Take 1 tablet (100 mg total) by mouth at  bedtime. 30 tablet 11  . Vitamin D, Ergocalciferol, (DRISDOL) 50000 units CAPS capsule Take 1 capsule (50,000 Units total) by mouth every 7 (seven) days. 12 capsule 1   No current facility-administered medications on file prior to visit.        Review of Systems:  Constitutional: No recent illness  Cardiac: No  chest pain, No  pressure, No palpitations  Respiratory:  No  shortness of breath. No  Cough  Skin: No  Rash  Neurologic: No  weakness, +Dizziness   Exam:  BP 122/70   Pulse 82   Wt 209 lb (94.8 kg)   BMI 34.78 kg/m   Constitutional: VS see above. General Appearance: alert, well-developed, well-nourished, NAD  Eyes: Normal lids and conjunctive, non-icteric sclera  Ears, Nose, Mouth, Throat: MMM, Normal external inspection ears/nares/mouth/lips/gums.  Neck: No masses, trachea midline.   Respiratory: Normal respiratory effort. no wheeze, no rhonchi, no rales  Cardiovascular: S1/S2 normal, no murmur, no rub/gallop auscultated. RRR.   Musculoskeletal: Gait normal. Symmetric and independent movement of all extremities  Neurological: Normal balance/coordination. No tremor.  Skin: warm, dry, intact.   Psychiatric: Normal judgment/insight. Normal mood and affect. Oriented x3.     ASSESSMENT/PLAN:   Blood pressure instability - Feeling better and blood pressure improved on current medication regimen with decrease of lisinopril. Okay to continue 5 mg twice a day  Elevated serum creatinine - Plan: BASIC METABOLIC PANEL WITH GFR, Urinalysis, Urine Microscopic - doubt autoimmune given lack of other symptoms but will consider further workup pending these results  Other headache syndrome - Following with neurology, increased Topamax seems to have helped   Follow-up plan: Return in about 6 weeks (around 05/12/2016) for recheck blood pressure and kidney function .  Visit summary with medication list and pertinent instructions was printed for patient to review, alert [place]  if any changes needed. All questions at time of visit were answered - patient instructed to contact office with any additional concerns. ER/RTC precautions were reviewed with the patient and understanding verbalized.

## 2016-04-01 DIAGNOSIS — R8299 Other abnormal findings in urine: Secondary | ICD-10-CM | POA: Diagnosis not present

## 2016-04-01 LAB — URINALYSIS, MICROSCOPIC ONLY
Bacteria, UA: NONE SEEN [HPF]
CASTS: NONE SEEN [LPF]
Crystals: NONE SEEN [HPF]
Yeast: NONE SEEN [HPF]

## 2016-04-01 LAB — URINALYSIS
Bilirubin Urine: NEGATIVE
Glucose, UA: NEGATIVE
HGB URINE DIPSTICK: NEGATIVE
Ketones, ur: NEGATIVE
NITRITE: NEGATIVE
PH: 5.5 (ref 5.0–8.0)
Protein, ur: NEGATIVE
Specific Gravity, Urine: 1.014 (ref 1.001–1.035)

## 2016-04-01 LAB — BASIC METABOLIC PANEL WITH GFR
BUN: 19 mg/dL (ref 7–25)
CALCIUM: 8.8 mg/dL (ref 8.6–10.4)
CHLORIDE: 105 mmol/L (ref 98–110)
CO2: 20 mmol/L (ref 20–31)
Creat: 1.09 mg/dL — ABNORMAL HIGH (ref 0.60–0.93)
GFR, EST NON AFRICAN AMERICAN: 51 mL/min — AB (ref >=60)
GFR, Est African American: 59 mL/min — ABNORMAL LOW (ref >=60)
Glucose, Bld: 84 mg/dL (ref 65–99)
POTASSIUM: 4.4 mmol/L (ref 3.5–5.3)
SODIUM: 135 mmol/L (ref 135–146)

## 2016-04-01 MED ORDER — CEPHALEXIN 500 MG PO CAPS: 500.0000 mg | ORAL_CAPSULE | Freq: Two times a day (BID) | ORAL | 0 refills | Status: DC

## 2016-04-01 MED ORDER — CEPHALEXIN 500 MG PO CAPS: 500.0000 mg | ORAL_CAPSULE | Freq: Three times a day (TID) | ORAL | 0 refills | Status: DC

## 2016-04-01 NOTE — Addendum Note (Signed)
Addended by: Darla Lesches T on: 04/01/2016 04:06 PM   Modules accepted: Orders

## 2016-04-01 NOTE — Addendum Note (Signed)
Addended by: Maryla Morrow on: 04/01/2016 12:43 PM   Modules accepted: Orders

## 2016-04-01 NOTE — Addendum Note (Signed)
Addended by: Maryla Morrow on: 04/01/2016 04:34 PM   Modules accepted: Orders

## 2016-04-01 NOTE — Addendum Note (Signed)
Addended by: Maryla Morrow on: 04/01/2016 04:33 PM   Modules accepted: Orders

## 2016-04-03 LAB — URINE CULTURE: ORGANISM ID, BACTERIA: NO GROWTH

## 2016-04-04 ENCOUNTER — Encounter: Payer: Self-pay | Admitting: Neurology

## 2016-04-04 ENCOUNTER — Ambulatory Visit (INDEPENDENT_AMBULATORY_CARE_PROVIDER_SITE_OTHER): Payer: Medicare Other | Admitting: Neurology

## 2016-04-04 VITALS — BP 138/67 | HR 76 | Resp 20 | Ht 65.0 in | Wt 209.0 lb

## 2016-04-04 DIAGNOSIS — G44329 Chronic post-traumatic headache, not intractable: Secondary | ICD-10-CM

## 2016-04-04 DIAGNOSIS — G4733 Obstructive sleep apnea (adult) (pediatric): Secondary | ICD-10-CM | POA: Diagnosis not present

## 2016-04-04 DIAGNOSIS — J189 Pneumonia, unspecified organism: Secondary | ICD-10-CM

## 2016-04-04 DIAGNOSIS — F40240 Claustrophobia: Secondary | ICD-10-CM

## 2016-04-04 DIAGNOSIS — F064 Anxiety disorder due to known physiological condition: Secondary | ICD-10-CM

## 2016-04-04 NOTE — Patient Instructions (Signed)

## 2016-04-04 NOTE — Progress Notes (Signed)
[facility]   Provider:  Larey Seat, M D  Primary Care Physician:  [name], DO   Referring Provider: Dr Jaynee Eagles.   Chief Complaint  Patient presents with  . New Patient (Initial Visit)    had sleep study about 6 years ago    HPI:  Carrie Salazar is a 71 y.o. female , seen here as in a referral from Dr. Jaynee Eagles to be evaluated for a sleep disorder that could contribute to the patient's headaches.   Chief complaint according to patient : " I have trouble to go to sleep at night, I have a lot of nasal allergies"   Carrie Salazar underwent heart surgery in 2013 and there were witnessed apneas noted. She was placed on CPAP during hospitalization and for different interfaces were used but she could tolerate and none of them. She does not think that she still has apnea or is not sure. She presented to Dr. Jaynee Eagles on March 5 for acute headaches which Dr. Mickey Farber  treatment seems to have reduced. She was started on topiramate and is now taking 100 mg at night, she was also given other medications in addition including a Z-Pak which she has completed. Dr. Jaynee Eagles further ordered a MRI of the brain which showed no abnormality instead just mild cortical volume loss and scattered small vessel ischemic microangiopathy was noted. She is referring our patient for evaluation of possible untreated OSA as it may contribute to her headache.  Sleep habits are as follows:  Takes 50 pills of Benadryl over-the-counter if she has trouble to go to sleep, but this has become a habit every night. She takes a Benadryl at 8:30 PM and she may be asleep by 12:30 AM. She watches TV for the last hour before she intends to go to bed, but she does not watch anything that is very upsetting or highly exciting. She describes her bedroom is cool, quiet and dark and she uses a ceiling fan in the background, she likes the feeling of the air moving. Her husband she has a bedroom with her, and he snores. He is usually in bed  before the patient and he fell already asleep in the living room. She will sleep through the night once Benadryl has " kicked in". She rarely ever experiences the need to go to the bathroom and she often wakes up in a similar position but she went to sleep in. The head of bed is elevated to 25 degrees and she sleeps on additional 2 pillows, she sleeps on her right side. She often has some vivid dreams but nothing nightmarish or very scary, she had once experienced on Ambien. She has been sick for the last 4 months and in this time has always felt that she was not as restored and refreshed in the morning. Her last infection was a bladder infection which also fragmented her night was bathroom breaks but this is an exception. She had 2 pneumonias and bronchopulmonary disease.   Sleep medical history and family sleep history:  Patient reports feeling depressed ever since the couple moved to [facility] she has been sick. She moved here in July 2017 and her illness has been protracted since September of that year. She has a past medical history that of an acute myocardial infarction open heart bypass surgery, hyperlipidemia hypertension, obesity, fibromyalgia, migraine headaches, chronic insomnia on Benadryl, dizziness, blurred vision hearing loss and tinnitus.   Maternal grandfather suffered of leukemia, maternal grandmother of stroke, her mother had  dementia at age 86, her father died young at 38 years of age of a stroke following a heart attack at age 74, mother is still alive.  See here Dr Cathren Laine note from 4 weeks ago.  Carrie Salazar is a 71 y.o. female here as a referral from Dr. Sheppard Coil for headaches. PMHx depression, insomnia (in the past tried Cymbalta and Remeron but had side effects on trazodone now fo rinsomnia), is on Prozac, OSA, Atrial Flutter, CAD s/p bypass graft, chronic back pain, HTN, HLD. She is on gabapentin, lisinopril, fluoxetine, carvedilol (which are used in migraine management). She  has a PMHx in the 1990s of being evaluated at [facility] for headaches, abnormal MRI brain and placed on Diamox. Recently was on Diamox with hypotension when the Diamox was stopped the headaches returned. Dr. Sheppard Coil has been trying to get her an MRI of the brain but ran into difficulties due to back implant and patient's allergy to IV contrast. She has had constant headaches for 2 weeks. She fell and hit her head and several days later the headaches started. Her head was sore for days, almost knocked herself out. Headaches are behind her eyes, she can;t stand light and noise and her neck hurts. The headaches are continuously. She was diagnosed with OSA and is not wearing a cpap at night she was placed on oxygen she had a difficult time on the cpap. She has morning headaches. She was given diamox which helped. She wants to go ointo a dark room, nausea and dizzy, sound sensitivity. In the 1990s she vomited, diagnosed her with migraines. It is throbbing, pulsating. She has blurry vision.    Social history:  Married for 70 years,  3 children, one daughter died in infancy,  the patient was never a smoker and that she is not an alcohol user maybe drinks 1 drink a month, drinks coffee in the morning, green tea in the afternoon, no sodas. No history of shift work.   Review of Systems: Out of a complete 14 system review, the patient complains of only the following symptoms, and all other reviewed systems are negative.  Light sleeper, tinnitus, history of headaches had been evaluated at [facility], was for years on Diamox, developed hypotension on Diamox, now normal pressure. Has a history of untreated OSA, atrial flutter, coronary artery disease with status post bypass graft, chronic back pain, hypertension, hyperlipidemia and weight gain.  She had not responded positively with Cymbalta and Remeron or trazodone in regards for insomnia and depression. She's now on Prozac. She is waking up with morning  headaches. Epworth score  1 , Fatigue severity score 36   , depression score 4/15    Social History   Social History  . Marital status: Married    Spouse name: N/A  . Number of children: 2  . Years of education: 13   Occupational History  . Retired    Social History Main Topics  . Smoking status: Former Smoker    Quit date: 2003  . Smokeless tobacco: Never Used  . Alcohol use Yes     Comment: 1 q wk  . Drug use: No  . Sexual activity: Not on file     Comment: Married   Other Topics Concern  . Not on file   Social History Narrative   Lives at home w/ her husband   Right-handed   Caffeine: 2 cups per day    Family History  Problem Relation Age of Onset  . Cancer Mother   .  Hyperlipidemia Father   . Hypertension Father   . Stroke Father   . Heart attack Father   . Hyperlipidemia Maternal Grandmother   . Hypertension Maternal Grandmother   . Stroke Maternal Grandmother   . Hemophilia Maternal Grandfather     Past Medical History:  Diagnosis Date  . Chronic fatigue   . Depression   . Fibromyalgia   . Heart attack   . Hx of CABG   . Hyperlipidemia   . Hypertension   . Migraine     Past Surgical History:  Procedure Laterality Date  . ABDOMINAL HYSTERECTOMY    . APPENDECTOMY    . BACK SURGERY    . BACK SURGERY     Lumbar rods  . BLADDER SURGERY    . BREAST LUMPECTOMY    . CARPAL TUNNEL RELEASE Bilateral   . CORONARY ARTERY BYPASS GRAFT     Triple  . LYMPECTOMY  1980  . TONSILLECTOMY    . VAGINA SURGERY    . WRIST SURGERY      Current Outpatient Prescriptions  Medication Sig Dispense Refill  . aspirin 81 MG tablet Take 162 mg by mouth daily.     . carvedilol (COREG) 12.5 MG tablet Take 12.5 mg by mouth 2 (two) times daily.  0  . cephALEXin (KEFLEX) 500 MG capsule Take 1 capsule (500 mg total) by mouth 2 (two) times daily. (Patient taking differently: Take 500 mg by mouth 3 (three) times daily. ) 14 capsule 0  . diphenhydrAMINE (BENADRYL) 25 mg  capsule Take 1 capsule (25 mg total) by mouth every 6 (six) hours as needed. 30 capsule 0  . estradiol (ESTRACE VAGINAL) 0.1 MG/GM vaginal cream Place 4.7-4 Applicatorfuls vaginally 3 (three) times a week. 42.5 g 12  . FLUoxetine (PROZAC) 40 MG capsule Take 1 capsule (40 mg total) by mouth daily. APPOINTMENT NEEDED FOR FURTHER REFILLS 30 capsule 0  . gabapentin (NEURONTIN) 600 MG tablet TAKE 1 TABLET( 600 MG TOTAL) BY MOUTH IN THE MORNING AND AFTERNOON. IN EVENING CAN TAKE 2 TABLETS( 1200 MG TOTAL) 120 tablet 3  . ipratropium (ATROVENT HFA) 17 MCG/ACT inhaler Inhale 2 puffs into the lungs every 6 (six) hours as needed for wheezing. 1 Inhaler 1  . isosorbide mononitrate (IMDUR) 60 MG 24 hr tablet Take 1 tablet (60 mg total) by mouth daily. 90 tablet 3  . lisinopril (PRINIVIL,ZESTRIL) 5 MG tablet Take 1 tablet (5 mg total) by mouth 2 (two) times daily. 90 tablet 0  . nitroGLYCERIN (NITROSTAT) 0.4 MG SL tablet Place 0.4 mg under the tongue every 5 (five) minutes as needed for chest pain.    Marland Kitchen oxymetazoline (AFRIN) 0.05 % nasal spray Place into the nose.    . pravastatin (PRAVACHOL) 20 MG tablet Take 1 tablet (20 mg total) by mouth daily. 90 tablet 3  . predniSONE (DELTASONE) 50 MG tablet Take one tablet 13 hours before MRI, one tablet 7 hours before MRI, one tablet 1 hour before MRI. Take 50mg  benadryl 1 hour before MRI. 3 tablet 0  . ranolazine (RANEXA) 500 MG 12 hr tablet Take 1 tablet (500 mg total) by mouth 2 (two) times daily. 90 tablet 2  . topiramate (TOPAMAX) 100 MG tablet Take 1 tablet (100 mg total) by mouth at bedtime. 30 tablet 11  . Vitamin D, Ergocalciferol, (DRISDOL) 50000 units CAPS capsule Take 1 capsule (50,000 Units total) by mouth every 7 (seven) days. 12 capsule 1   No current facility-administered medications for this visit.  Allergies as of 04/04/2016 - Review Complete 04/04/2016  Allergen Reaction Noted  . Albuterol Palpitations and Other (See Comments) 12/23/2015  .  Dilaudid [hydromorphone] Anaphylaxis 11/04/2015  . Ivp dye [iodinated diagnostic agents] Other (See Comments) 08/07/2015  . Metrizamide Other (See Comments), Itching, Rash, and Hives 08/07/2015  . Nitrofuran derivatives Itching 10/07/2015  . Nitrofurantoin Itching 10/07/2015  . Percocet [oxycodone-acetaminophen] Itching 09/24/2015  . Sulfa antibiotics Hives and Other (See Comments) 11/04/2015  . Tape Itching 11/24/2015  . Levaquin [levofloxacin in d5w] Other (See Comments) 08/07/2015  . Codeine Itching and Hives 08/07/2015  . Diamox [acetazolamide] Other (See Comments) 03/14/2016  . Lanolin Hives 02/05/2016  . Latex Other (See Comments) 02/02/2016  . Percodan [oxycodone-aspirin]  08/07/2015  . Sulfasalazine Hives 11/04/2015  . Nickel Rash 02/02/2016  . Penicillins Hives 08/07/2015  . Sulfamethoxazole Rash and Hives 11/24/2015    Vitals: BP 138/67   Pulse 76   Resp 20   Ht 5\' 5"  (1.651 m)   Wt 209 lb (94.8 kg)   BMI 34.78 kg/m  Last Weight:  Wt Readings from Last 1 Encounters:  04/04/16 209 lb (94.8 kg)   SWN:IOEV mass index is 34.78 kg/m.     Last Height:   Ht Readings from Last 1 Encounters:  04/04/16 5\' 5"  (1.651 m)    Physical exam:  General: The patient is awake, alert and appears not in acute distress. The patient is well groomed. Head: Normocephalic, atraumatic. Neck is supple. Mallampati  4 neck circumference:16. Nasal airflow congested , TMJ is not evident . Retrognathia is seen.  Cardiovascular:  Regular rate and rhythm , without  murmurs or carotid bruit, and without distended neck veins. Respiratory: Lungs are clear to auscultation. Skin:  Without evidence of edema, or rash Trunk: BMI is elevated  The patient's posture is erect   Neurologic exam : The patient is awake and alert, oriented to place and time.    Attention span & concentration ability appears normal.  Speech is fluent,  without dysarthria, dysphonia or aphasia.  Mood and affect are  appropriate.  Cranial nerves: Pupils are equal and briskly reactive to light. Funduscopic exam without evidence of pallor or edema.  Extraocular movements  in vertical and horizontal planes intact and without nystagmus. Visual fields by finger perimetry are intact. Hearing to finger rub intact. Facial sensation intact to fine touch.Facial motor strength is symmetric and tongue and uvula move midline. Shoulder shrug was symmetrical.   Assessment:  After physical and neurologic examination, review of laboratory studies,  Personal review of imaging studies, reports of other /same  Imaging studies, results of polysomnography and / or neurophysiology testing and pre-existing records as far as provided in visit., my assessment is   1) I performed a very concise sleep medicine related physical exam, I would not evaluate sensory or motor function. My goal is to see if the patient still has obstructive sleep apnea and how this would be treated. If she indeed has obstructive sleep apnea associated with hypoxemia or hypercapnia CPAP is usually the only option aside from BiPAP. If the gas exchange problem is not present and she is mainly a snorer with some apnea in the mild-to-moderate category, she could be treated with a dental device inside of the CPAP machine.Given that she wakes up this morning headaches, It is very important for [place] to classify her apnea correctly and I will invite her for an attended sleep study therefore. 2) INSOMNIA ; and that she has a history of  claustrophobia may be more important to work on the underlying anxiety to allow her to sleep, tolerate CPAP as needed it would also help with future MRI studies, etc. This may be best addressed with a psychologist or psychiatrist.  3) headaches may benefit from sleep apnea treatment if sleep apnea is found.   The patient was advised of the nature of the diagnosed disorder , the treatment options and the  risks for general health and wellness  arising from not treating the condition.   I spent more than 40 minutes of face to face time with the patient.  Greater than 50% of time was spent in counseling and coordination of care. We have discussed the diagnosis and differential and I answered the patient's questions.    Plan:  Treatment plan and additional workup :  SPLIT night PSG with capnography.  The Patient is  to bring her  Benadryl with her, she cannot tolerate Ambien.   RV after sleep test if abnormal, if no abnormality of organic origin is found, return to primary neurologist.   Larey Seat, MD 4/82/7078, 6:75 PM  Certified in Neurology by ABPN Certified in [place] by [facility] [address], [place], [place] [address]

## 2016-04-07 ENCOUNTER — Telehealth: Payer: Self-pay | Admitting: Emergency Medicine

## 2016-04-07 NOTE — Telephone Encounter (Signed)
Okay, only other thing I would advise is frequency can be due to problems other than infection. Agree with office follow-up if no improvement

## 2016-04-07 NOTE — Telephone Encounter (Signed)
Patient called for recent lab results; gave them to her. She states she still is experiencing frequency; culture negative; on day 4 of antibiotics. She will call us if not clear of symptoms over next couple days.

## 2016-04-21 ENCOUNTER — Encounter: Payer: Self-pay | Admitting: Neurology

## 2016-04-21 ENCOUNTER — Ambulatory Visit (INDEPENDENT_AMBULATORY_CARE_PROVIDER_SITE_OTHER): Payer: Medicare Other | Admitting: Neurology

## 2016-04-21 VITALS — BP 131/75 | HR 76 | Ht 65.0 in | Wt 208.2 lb

## 2016-04-21 DIAGNOSIS — G43009 Migraine without aura, not intractable, without status migrainosus: Secondary | ICD-10-CM | POA: Diagnosis not present

## 2016-04-21 MED ORDER — SUMATRIPTAN SUCCINATE 100 MG PO TABS: 100.0000 mg | ORAL_TABLET | Freq: Once | ORAL | 12 refills | Status: DC | PRN

## 2016-04-21 NOTE — Progress Notes (Signed)
[facility]    Provider:  Dr Jaynee Eagles Referring Provider: [name], DO Primary Care Physician:  [name], DO  CC:  Headache  Interval headache 04/21/2016: She is back for follow up of headaches which resolved with Topiramate. Unfortunately she describes lots of side effects from the Topiramate. Her headaches are better on the Topiramate but her blood pressure is decreasing again. She is fatigued. Sh ehas a tremor. Her eyebrows are falling out and her hair feels like "straw". We will stop the Topiramate. Start Imitrex for acute management.  Medications patient has taken that are use din headache management: Diamox, Prozac, Topiramate, lisinopril, gabapentin, carvedilol, cymbalta  HPI:  Carrie Salazar is a 71 y.o. female here as a referral from Dr. Sheppard Coil for headaches. PMHx depression, insomnia (in the past tried Cymbalta and Remeron but had side effects on trazodone now fo rinsomnia), is on Prozac, OSA, Atrial Flutter, CAD s/p bypass graft, chronic back pain, HTN, HLD. She is on gabapentin, lisinopril, fluoxetine, carvedilol (which are used in migraine management). She has a PMHx in the 1990s of being evaluated at [facility] for headaches, abnormal MRI brain and placed on Diamox. Recently was on Diamox with hypotension when the Diamox was stopped the headaches returned. Dr. Sheppard Coil has been trying to get her an MRI of the brain but ran into difficulties due to back implant and patient's allergy to IV contrast. She has had constant headaches for 2 weeks. She fell and hit her head and several days later the headaches started. Her head was sore for days, almost knocked herself out. Headaches are behind her eyes, she can;t stand light and noise and her neck hurts. The headaches are continuously. She was diagnosed with OSA and is not wearing a cpap at night she was placed on oxygen she had a difficult time on the cpap. She has morning headaches. She was given diamox which  helped. She wants to go ointo a dark room, nausea and dizzy, sound sensitivity. In the 1990s she vomited, diagnosed her with migraines. It is throbbing, pulsating. She has blurry vision.   Meds: gabapentin, lisinopril, fluoxetine, carvedilol, cymbalta  Reviewed notes, labs and imaging from outside physicians, which showed: Patient was seen by pcp 02/25/2016 for headache. She previously was on Diamox for headaches, had an abnormal MRI brain and possibly intracranial HTN, no records available. Headaches started 4-6 weeks previously. Has vision changes in the R eye, dizziness, hearing changes. patient states headaches exactly the same as they were in the 1990s when she was on diamox, pcp suspects NPH and pseudotumor cerebri. An MRI of the brain was ordered.   She was seen in the ED 2/19 for occipital headaches and behind both eyes onset 3 weeks prior, throbbing. She fell and hit he head in the occiput before the headaches began. She was started on Diamox and prednisone for pretreatment of MRI by her pcp.The ED noted her optic disks were sharp, acuity 20/40 OS and 20/40 OD. Headaches improved after IV Reglan. Diamox was stopped. Creatinine was 1.65, diagnosed with cluster headaches and mild renal insufficiency.   She was seen for headache follow up 2/27, had hypotension due to Diamox overuse. Headaches were resolved on that and the dizzy/lightheadedness improved with discontinuation of the medication. Headaches are back since stopping the diamox however. MRI is scheduled at [facility] due to IV allergy and back implant accommodation.    CBC normal, CMP with creatinine .99, BUN 16 01/25/2016  Personally reviewed images CT scan and agree  with the following:  FINDINGS: Brain: No evidence of acute infarction, hemorrhage, hydrocephalus, extra-axial collection or mass lesion/mass effect.  Prominence of the ventricles and sulci reflects mild cortical volume loss. Mild cerebellar atrophy is noted.  Scattered periventricular and subcortical white matter change likely reflects small vessel ischemic microangiopathy.  The brainstem and fourth ventricle are within normal limits. The basal ganglia are unremarkable in appearance. The cerebral hemispheres demonstrate grossly normal gray-white differentiation. No mass effect or midline shift is seen.  Vascular: No hyperdense vessel or unexpected calcification.  Skull: There is no evidence of fracture; visualized osseous structures are unremarkable in appearance.  Sinuses/Orbits: The orbits are within normal limits. The paranasal sinuses and mastoid air cells are well-aerated.  Other: No significant soft tissue abnormalities are seen.  IMPRESSION: 1. No evidence of traumatic intracranial injury or fracture. 2. Mild cortical volume loss and scattered small vessel ischemic microangiopathy.  Review of Systems: Patient complains of symptoms per HPI as well as the following symptoms: no CP, no SOB. Pertinent negatives per HPI. All others negative.    Social History   Social History  . Marital status: Married    Spouse name: N/A  . Number of children: 2  . Years of education: 13   Occupational History  . Retired    Social History Main Topics  . Smoking status: Former Smoker    Quit date: 2003  . Smokeless tobacco: Never Used  . Alcohol use Yes     Comment: 1 q wk  . Drug use: No  . Sexual activity: Not on file     Comment: Married   Other Topics Concern  . Not on file   Social History Narrative   Lives at home w/ her husband   Right-handed   Caffeine: 2 cups per day    Family History  Problem Relation Age of Onset  . Cancer Mother   . Hyperlipidemia Father   . Hypertension Father   . Stroke Father   . Heart attack Father   . Hyperlipidemia Maternal Grandmother   . Hypertension Maternal Grandmother   . Stroke Maternal Grandmother   . Hemophilia Maternal Grandfather     Past Medical History:    Diagnosis Date  . Chronic fatigue   . Depression   . Fibromyalgia   . Heart attack ([place])   . Hx of CABG   . Hyperlipidemia   . Hypertension   . Migraine     Past Surgical History:  Procedure Laterality Date  . ABDOMINAL HYSTERECTOMY    . APPENDECTOMY    . BACK SURGERY    . BACK SURGERY     Lumbar rods  . BLADDER SURGERY    . BREAST LUMPECTOMY    . CARPAL TUNNEL RELEASE Bilateral   . CORONARY ARTERY BYPASS GRAFT     Triple  . LYMPECTOMY  1980  . TONSILLECTOMY    . VAGINA SURGERY    . WRIST SURGERY      Current Outpatient Prescriptions  Medication Sig Dispense Refill  . aspirin 81 MG tablet Take 162 mg by mouth daily.     . carvedilol (COREG) 12.5 MG tablet Take 12.5 mg by mouth 2 (two) times daily.  0  . diphenhydrAMINE (BENADRYL) 25 mg capsule Take 1 capsule (25 mg total) by mouth every 6 (six) hours as needed. 30 capsule 0  . estradiol (ESTRACE VAGINAL) 0.1 MG/GM vaginal cream Place 6.9-6 Applicatorfuls vaginally 3 (three) times a week. 42.5 g 12  . FLUoxetine (PROZAC)  40 MG capsule Take 1 capsule (40 mg total) by mouth daily. APPOINTMENT NEEDED FOR FURTHER REFILLS 30 capsule 0  . ipratropium (ATROVENT HFA) 17 MCG/ACT inhaler Inhale 2 puffs into the lungs every 6 (six) hours as needed for wheezing. 1 Inhaler 1  . isosorbide mononitrate (IMDUR) 60 MG 24 hr tablet Take 1 tablet (60 mg total) by mouth daily. 90 tablet 3  . lisinopril (PRINIVIL,ZESTRIL) 5 MG tablet Take 1 tablet (5 mg total) by mouth 2 (two) times daily. 90 tablet 0  . nitroGLYCERIN (NITROSTAT) 0.4 MG SL tablet Place 0.4 mg under the tongue every 5 (five) minutes as needed for chest pain.    . pravastatin (PRAVACHOL) 20 MG tablet Take 1 tablet (20 mg total) by mouth daily. 90 tablet 3  . ranolazine (RANEXA) 500 MG 12 hr tablet Take 1 tablet (500 mg total) by mouth 2 (two) times daily. 90 tablet 2  . topiramate (TOPAMAX) 100 MG tablet Take 1 tablet (100 mg total) by mouth at bedtime. (Patient taking  differently: Take 50 mg by mouth at bedtime. ) 30 tablet 11  . Vitamin D, Ergocalciferol, (DRISDOL) 50000 units CAPS capsule Take 1 capsule (50,000 Units total) by mouth every 7 (seven) days. 12 capsule 1  . SUMAtriptan (IMITREX) 100 MG tablet Take 1 tablet (100 mg total) by mouth once as needed. May repeat in 2 hours if headache persists or recurs. 10 tablet 12   No current facility-administered medications for this visit.     Allergies as of 04/21/2016 - Review Complete 04/21/2016  Allergen Reaction Noted  . Albuterol Palpitations and Other (See Comments) 12/23/2015  . Dilaudid [hydromorphone] Anaphylaxis 11/04/2015  . Ivp dye [iodinated diagnostic agents] Other (See Comments) 08/07/2015  . Metrizamide Other (See Comments), Itching, Rash, and Hives 08/07/2015  . Nitrofuran derivatives Itching 10/07/2015  . Nitrofurantoin Itching 10/07/2015  . Percocet [oxycodone-acetaminophen] Itching 09/24/2015  . Sulfa antibiotics Hives and Other (See Comments) 11/04/2015  . Tape Itching 11/24/2015  . Levaquin [levofloxacin in d5w] Other (See Comments) 08/07/2015  . Codeine Itching and Hives 08/07/2015  . Diamox [acetazolamide] Other (See Comments) 03/14/2016  . Lanolin Hives 02/05/2016  . Latex Other (See Comments) 02/02/2016  . Percodan [oxycodone-aspirin]  08/07/2015  . Sulfasalazine Hives 11/04/2015  . Nickel Rash 02/02/2016  . Penicillins Hives 08/07/2015  . Sulfamethoxazole Rash and Hives 11/24/2015    Vitals: BP 131/75   Pulse 76   Ht '5\' 5"'$  (1.651 m)   Wt 208 lb 3.2 oz (94.4 kg)   BMI 34.65 kg/m  Last Weight:  Wt Readings from Last 1 Encounters:  04/21/16 208 lb 3.2 oz (94.4 kg)   Last Height:   Ht Readings from Last 1 Encounters:  04/21/16 '5\' 5"'$  (1.651 m)    Exam: Gen: NAD, conversant, well nourised, obese, well groomed                     CV: RRR, no MRG. No Carotid Bruits. No peripheral edema, warm, nontender Eyes: Conjunctivae clear without exudates or  hemorrhage  Neuro: Detailed Neurologic Exam  Speech:    Speech is normal; fluent and spontaneous with normal comprehension.  Cognition:    The patient is oriented to person, place, and time;     recent and remote memory intact;     language fluent;     normal attention, concentration,     fund of knowledge Cranial Nerves:    The pupils are equal, round, and reactive to light. The  fundi are normal and spontaneous venous pulsations are present. Visual fields are full to finger confrontation. Extraocular movements are intact. Trigeminal sensation is intact and the muscles of mastication are normal. The face is symmetric. The palate elevates in the midline. Hearing intact. Voice is normal. Shoulder shrug is normal. The tongue has normal motion without fasciculations.   Coordination:    Normal finger to nose and heel to shin. Normal rapid alternating movements.   Gait:    Antalgic gait with a cane  Motor Observation:    No asymmetry, no atrophy, and no involuntary movements noted. Tone:    Normal muscle tone.    Posture:    Posture is normal. normal erect    Strength: right leg mild prox weakness otherwise strength is V/V in the upper and lower limbs.      Sensation: intact to LT     Reflex Exam:  DTR's:    Deep tendon reflexes in the upper and lower extremities are normal bilaterally.   Toes:    The toes are downgoing bilaterally.   Clonus:    Clonus is absent.      Assessment/Plan:  71 year old with a PMHx of remote migraines and cluster headaches. She has had 2 weeks of headaches behind her eyes, she can;t stand light and noise and her neck hurts. The headaches are continuous since falling and hitting her head very hard. She was diagnosed with OSA and is not wearing a cpap at night she was placed on oxygen in the past because she had a difficult time on the cpap mask. She has morning headaches. She was given diamox which helped but experienced hypotension. With  the headaches, she wants to go ointo a dark room, associated nausea and dizzy, light and sound sensitivity,  and dizziness, throbbing/pulsating quality. In the 1990s was diagnosed her with migraines. This may be migraines exacerbated by her head trauma.  I do highly recommend MRI of the brain and working with Dr. Lyn Hollingshead to have that done and if any signs of intracranial hypertension then we can order an LP. Will start Topiramate which is similar in action to Diamox but not as strong, she still should watch her BP as well as a steroid taper medrol dosepak. Will check ESR and CRP for temporal arteritis. Will order sleep eval here to see if we can get her in sooner than May for sleep eval.  - ESR/CRP, MRI brain unremarkable - Sleep study scheduled next week (she was diagnosed with OSA in the past and not on cpap) - Headaches improved on Topiramate but she has significant side effects. Stop and when symptoms have improved may start Amitriptyline instead which may also help with her insomnia. - There are studies that show zonisamide is effective and well tolerated for migraine prevention in patients refractory to topiramate. With the exception of the inhibition of T-type calcium channels by zonisamide, its mechanisms of action seem to be very similar to topiramate's. Will try this next vs Amitriptyline. Will keep her off of medication and see if her symptoms resolve and get her into the sleep lab.  - Imitrex at onset. - She can't sleep so may also consider Amitriptyline or Nortriptyline   Assessment/Plan:    Naomie Dean, MD  [facility] [address] [place], [place] [address]  Phone [phone] Fax [phone]  A total of 25 minutes was spent face-to-face with this patient. Over half this time was spent on counseling patient on the migraine,  medication side effects diagnosis and different diagnostic and therapeutic options available.

## 2016-04-21 NOTE — Patient Instructions (Addendum)
Remember to drink plenty of fluid, eat healthy meals and do not skip any meals. Try to eat protein with a every meal and eat a healthy snack such as fruit or nuts in between meals. Try to keep a regular sleep-wake schedule and try to exercise daily, particularly in the form of walking, 20-30 minutes a day, if you can.   As far as your medications are concerned, I would like to suggest:  Stop Topiramate Imitrex: Please take one tablet at the onset of your headache. If it does not improve the symptoms please take one additional tablet in 2 hours. Do not take more then 2 tablets in 24hrs. Do not take use more then 2 to 3 times in a week.  As far as diagnostic testing: Sleep study  Our phone number is [phone]. We also have an after hours call service for urgent matters and there is a physician on-call for urgent questions. For any emergencies you know to call 911 or go to the nearest emergency room  Sumatriptan tablets What is this medicine? SUMATRIPTAN (soo ma TRIP tan) is used to treat migraines with or without aura. An aura is a strange feeling or visual disturbance that warns you of an attack. It is not used to prevent migraines. This medicine may be used for other purposes; ask your health care provider or pharmacist if you have questions. COMMON BRAND NAME(S): Imitrex, Migraine Pack What should I tell my health care provider before I take this medicine? They need to know if you have any of these conditions: -circulation problems in fingers and toes -diabetes -heart disease -high blood pressure -high cholesterol -history of irregular heartbeat -history of stroke -kidney disease -liver disease -postmenopausal or surgical removal of uterus and ovaries -seizures -smoke tobacco -stomach or intestine problems -an unusual or allergic reaction to sumatriptan, other medicines, foods, dyes, or preservatives -pregnant or trying to get pregnant -breast-feeding How should I use this  medicine? Take this medicine by mouth with a glass of water. Follow the directions on the prescription label. This medicine is taken at the first symptoms of a migraine. It is not for everyday use. If your migraine headache returns after one dose, you can take another dose as directed. You must leave at least 2 hours between doses, and do not take more than 100 mg as a single dose. Do not take more than 200 mg total in any 24 hour period. If there is no improvement at all after the first dose, do not take a second dose without talking to your doctor or health care professional. Do not take your medicine more often than directed. Talk to your pediatrician regarding the use of this medicine in children. Special care may be needed. Overdosage: If you think you have taken too much of this medicine contact a poison control center or emergency room at once. NOTE: This medicine is only for you. Do not share this medicine with others. What if I miss a dose? This does not apply; this medicine is not for regular use. What may interact with this medicine? Do not take this medicine with any of the following medicines: -cocaine -ergot alkaloids like dihydroergotamine, ergonovine, ergotamine, methylergonovine -feverfew -MAOIs like Carbex, Eldepryl, Marplan, Nardil, and Parnate -other medicines for migraine headache like almotriptan, eletriptan, frovatriptan, naratriptan, rizatriptan, zolmitriptan -tryptophan This medicine may also interact with the following medications: -certain medicines for depression, anxiety, or psychotic disturbances This list may not describe all possible interactions. Give your health care provider a  list of all the medicines, herbs, non-prescription drugs, or dietary supplements you use. Also tell them if you smoke, drink alcohol, or use illegal drugs. Some items may interact with your medicine. What should I watch for while using this medicine? Only take this medicine for a migraine  headache. Take it if you get warning symptoms or at the start of a migraine attack. It is not for regular use to prevent migraine attacks. You may get drowsy or dizzy. Do not drive, use machinery, or do anything that needs mental alertness until you know how this medicine affects you. To reduce dizzy or fainting spells, do not sit or stand up quickly, especially if you are an older patient. Alcohol can increase drowsiness, dizziness and flushing. Avoid alcoholic drinks. Smoking cigarettes may increase the risk of heart-related side effects from using this medicine. If you take migraine medicines for 10 or more days a month, your migraines may get worse. Keep a diary of headache days and medicine use. Contact your healthcare professional if your migraine attacks occur more frequently. What side effects may I notice from receiving this medicine? Side effects that you should report to your doctor or health care professional as soon as possible: -allergic reactions like skin rash, itching or hives, swelling of the face, lips, or tongue -bloody or watery diarrhea -hallucination, loss of contact with reality -pain, tingling, numbness in the face, hands, or feet -seizures -signs and symptoms of a blood clot such as breathing problems; changes in vision; chest pain; severe, sudden headache; pain, swelling, warmth in the leg; trouble speaking; sudden numbness or weakness of the face, arm, or leg -signs and symptoms of a dangerous change in heartbeat or heart rhythm like chest pain; dizziness; fast or irregular heartbeat; palpitations, feeling faint or lightheaded; falls; breathing problems -signs and symptoms of a stroke like changes in vision; confusion; trouble speaking or understanding; severe headaches; sudden numbness or weakness of the face, arm, or leg; trouble walking; dizziness; loss of balance or coordination -stomach pain Side effects that usually do not require medical attention (report to your  doctor or health care professional if they continue or are bothersome): -changes in taste -facial flushing -headache -muscle cramps -muscle pain -nausea, vomiting -weak or tired This list may not describe all possible side effects. Call your doctor for medical advice about side effects. You may report side effects to FDA at 1-800-FDA-1088. Where should I keep my medicine? Keep out of the reach of children. Store at room temperature between 2 and 30 degrees C (36 and 86 degrees F). Throw away any unused medicine after the expiration date. NOTE: This sheet is a summary. It may not cover all possible information. If you have questions about this medicine, talk to your doctor, pharmacist, or health care provider.  2018 Elsevier/Gold Standard (2015-01-29 12:38:23)

## 2016-04-26 ENCOUNTER — Other Ambulatory Visit: Payer: Self-pay | Admitting: Osteopathic Medicine

## 2016-04-26 DIAGNOSIS — F331 Major depressive disorder, recurrent, moderate: Secondary | ICD-10-CM

## 2016-04-28 ENCOUNTER — Ambulatory Visit (INDEPENDENT_AMBULATORY_CARE_PROVIDER_SITE_OTHER): Payer: Medicare Other | Admitting: Neurology

## 2016-04-28 DIAGNOSIS — F064 Anxiety disorder due to known physiological condition: Secondary | ICD-10-CM

## 2016-04-28 DIAGNOSIS — J189 Pneumonia, unspecified organism: Secondary | ICD-10-CM

## 2016-04-28 DIAGNOSIS — G44329 Chronic post-traumatic headache, not intractable: Secondary | ICD-10-CM

## 2016-04-28 DIAGNOSIS — F40240 Claustrophobia: Secondary | ICD-10-CM

## 2016-04-28 DIAGNOSIS — G4733 Obstructive sleep apnea (adult) (pediatric): Secondary | ICD-10-CM | POA: Diagnosis not present

## 2016-05-02 ENCOUNTER — Telehealth: Payer: Self-pay

## 2016-05-02 NOTE — Telephone Encounter (Signed)
Received faxed medical device correction re: SCS issue of unresponsiveness and beeping Model [phone] Rechargers w/ info on how to prevent occurrence of this issue and restore functionality of the Recharger if the issue occurs. Given to Dr. Jaynee Eagles for review/signature, returned to [employer].

## 2016-05-05 ENCOUNTER — Encounter: Payer: Self-pay | Admitting: Osteopathic Medicine

## 2016-05-05 ENCOUNTER — Ambulatory Visit: Payer: Medicare Other | Admitting: Osteopathic Medicine

## 2016-05-05 ENCOUNTER — Ambulatory Visit (INDEPENDENT_AMBULATORY_CARE_PROVIDER_SITE_OTHER): Payer: Medicare Other | Admitting: Osteopathic Medicine

## 2016-05-05 VITALS — BP 141/61 | HR 72 | Ht 65.0 in | Wt 205.0 lb

## 2016-05-05 DIAGNOSIS — F331 Major depressive disorder, recurrent, moderate: Secondary | ICD-10-CM | POA: Diagnosis not present

## 2016-05-05 DIAGNOSIS — H579 Unspecified disorder of eye and adnexa: Secondary | ICD-10-CM | POA: Diagnosis not present

## 2016-05-05 DIAGNOSIS — G4489 Other headache syndrome: Secondary | ICD-10-CM | POA: Diagnosis not present

## 2016-05-05 DIAGNOSIS — R7989 Other specified abnormal findings of blood chemistry: Secondary | ICD-10-CM | POA: Diagnosis not present

## 2016-05-05 DIAGNOSIS — I998 Other disorder of circulatory system: Secondary | ICD-10-CM

## 2016-05-05 DIAGNOSIS — L989 Disorder of the skin and subcutaneous tissue, unspecified: Secondary | ICD-10-CM

## 2016-05-05 MED ORDER — FLUOXETINE HCL 20 MG PO CAPS: 20.0000 mg | ORAL_CAPSULE | Freq: Every day | ORAL | 1 refills | Status: DC

## 2016-05-05 NOTE — Progress Notes (Signed)
HPI: Carrie Salazar is a 71 y.o. female  who presents to [place] today, 05/05/16,  for chief complaint of:  Chief Complaint  Patient presents with  . Follow-up    BLOOD PRESSURE    Headaches: Patient is getting fresh treated with lack of clear plan or clear etiology of persistent headaches described as pressure/fullness behind the eyes with sensitivity to light and noise, associated with neck pain. Onset after head injury a few months ago. Similar symptoms in the past but no records available, apparently at that point treated with Diamox which seems to be the only thing that has helped. Neurology has attempted treatment with several other medications, based on most recent note unclear what plan may be at this point since patient is unable to complete sleep study. Patient reports history of sleep study in the past but wasn't absolutely not able to tolerate the mask, so even if she were diagnosed officially with sleep apnea doesn't sound like she would be amenable to the treatment given claustrophobia-type symptoms with CPAP.   Patient has altered her medication regimen independently of physician instruction. She is currently taking the Diamox 3 times per day, has stopped lisinopril, is still taking the carvedilol. Has stopped the topiramate. Blood pressure as below. To be holding steady in normal range and patient denies symptoms of chest pain, pressure, dizziness. She states the headaches are a little bit better with this regimen but still bothering her.  Renal function is a concern for this patient, particularly if her goal is to stay on the Diamox. We have been watching renal function closely and patient is due for labs at this point.  Past medical history, surgical history, social history and family history reviewed.  Patient Active Problem List   Diagnosis Date Noted  . Elevated serum creatinine 03/31/2016  . Blood pressure instability 03/31/2016  .  Other headache syndrome 02/25/2016  . SOB (shortness of breath) 01/25/2016  . Anemia 01/25/2016  . Vaginal lesion October 23, 202017  . Moderate episode of recurrent major depressive disorder ([place]) 12/10/2015  . Female cystocele 10/16/2015  . Urinary urgency 10/16/2015  . History of obstructive sleep apnea 08/20/2015  . Atrial flutter ([place]) 08/20/2015  . 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 Chronic back pain 08/13/2015  . Recurrent UTI 08/13/2015  . Insomnia 08/13/2015  . Hyperhidrosis 08/13/2015    Current medication list and allergy/intolerance information reviewed.   Current Outpatient Prescriptions on File Prior to Visit  Medication Sig Dispense Refill  . aspirin 81 MG tablet Take 162 mg by mouth daily.     . carvedilol (COREG) 12.5 MG tablet Take 12.5 mg by mouth 2 (two) times daily.  0  . diphenhydrAMINE (BENADRYL) 25 mg capsule Take 1 capsule (25 mg total) by mouth every 6 (six) hours as needed. 30 capsule 0  . estradiol (ESTRACE VAGINAL) 0.1 MG/GM vaginal cream Place 2.9-5 Applicatorfuls vaginally 3 (three) times a week. 42.5 g 12  . ipratropium (ATROVENT HFA) 17 MCG/ACT inhaler Inhale 2 puffs into the lungs every 6 (six) hours as needed for wheezing. 1 Inhaler 1  . isosorbide mononitrate (IMDUR) 60 MG 24 hr tablet Take 1 tablet (60 mg total) by mouth daily. 90 tablet 3  . nitroGLYCERIN (NITROSTAT) 0.4 MG SL tablet Place 0.4 mg under the tongue every 5 (five) minutes as needed for chest pain.    . pravastatin (PRAVACHOL) 20 MG tablet Take 1 tablet (  20 mg total) by mouth daily. 90 tablet 3  . ranolazine (RANEXA) 500 MG 12 hr tablet Take 1 tablet (500 mg total) by mouth 2 (two) times daily. 90 tablet 2  . Vitamin D, Ergocalciferol, (DRISDOL) 50000 units CAPS capsule Take 1 capsule (50,000 Units total) by mouth every 7 (seven) days. 12 capsule 1  . lisinopril (PRINIVIL,ZESTRIL) 5 MG tablet Take 1 tablet (5 mg  total) by mouth 2 (two) times daily. (Patient not taking: Reported on 05/05/2016) 90 tablet 0  . SUMAtriptan (IMITREX) 100 MG tablet Take 1 tablet (100 mg total) by mouth once as needed. May repeat in 2 hours if headache persists or recurs. (Patient not taking: Reported on 05/05/2016) 10 tablet 12  . topiramate (TOPAMAX) 100 MG tablet Take 1 tablet (100 mg total) by mouth at bedtime. (Patient not taking: Reported on 05/05/2016) 30 tablet 11   No current facility-administered medications on file prior to visit.    Allergies  Allergen Reactions  . Albuterol Palpitations and Other (See Comments)    HALLUCINATIONS Also feels "funny in the head"  . Dilaudid [Hydromorphone] Anaphylaxis    Caused patient to have heart attack and crazy in the head  . Ivp Dye [Iodinated Diagnostic Agents] Other (See Comments)    Itching and causes me to pass out  . Metrizamide Other (See Comments), Itching, Rash and Hives    Itching and causes me to pass out  . Nitrofuran Derivatives Itching  . Nitrofurantoin Itching  . Percocet [Oxycodone-Acetaminophen] Itching  . Sulfa Antibiotics Hives and Other (See Comments)    Itching and causes me to pass out**PT CAN TAKE BENADRYL 2 HOURS PRIOR AND IS OK**   . Tape Itching  . Levaquin [Levofloxacin In D5w] Other (See Comments)    "I get real dizzy and pass out"  "I get real dizzy and pass out"   . Codeine Itching and Hives  . Diamox [Acetazolamide] Other (See Comments)    Low BP, dizziness  . Lanolin Hives    "eats skin off"  . Latex Other (See Comments)    LEAVES REALLY BAD BURN ON SKIN   . Percodan [Oxycodone-Aspirin]   . Sulfasalazine Hives  . Nickel Rash  . Penicillins Hives  . Sulfamethoxazole Rash and Hives      Review of Systems:  Constitutional: No recent illness  HEENT: +headache - Persistent without change from previous visits, no vision change  Cardiac: No  chest pain, No  pressure, No palpitations  Respiratory:  No  shortness of breath. No   Cough  Musculoskeletal: No new myalgia/arthralgia  Neurologic: No  weakness, No  Dizziness   Exam:  BP (!) 141/61   Pulse 72   Ht 5\' 5"  (1.651 m)   Wt 205 lb (93 kg)   BMI 34.11 kg/m   Constitutional: VS see above. General Appearance: alert, well-developed, well-nourished, NAD  Eyes: Normal lids and conjunctive, non-icteric sclera  Ears, Nose, Mouth, Throat: MMM, Normal external inspection ears/nares/mouth/lips/gums.  Neck: No masses, trachea midline.   Respiratory: Normal respiratory effort. no wheeze, no rhonchi, no rales  Cardiovascular: S1/S2 normal, no murmur, no rub/gallop auscultated. RRR.   Musculoskeletal: Gait normal. Symmetric and independent movement of all extremities  Neurological: Normal balance/coordination. No tremor.  Skin: warm, dry, intact.   Psychiatric: Normal judgment/insight. Normal mood and affect. Oriented x3.   Results for orders placed or performed in visit on 05/05/16 (from the past 48 hour(s))  BASIC METABOLIC PANEL WITH GFR     Status:  Abnormal   Collection Time: 05/05/16  4:55 PM  Result Value Ref Range   Sodium 138 135 - 146 mmol/L   Potassium 4.4 3.5 - 5.3 mmol/L   Chloride 104 98 - 110 mmol/L   CO2 24 20 - 31 mmol/L   Glucose, Bld 83 65 - 99 mg/dL   BUN 17 7 - 25 mg/dL   Creat 1.10 (H) 0.60 - 0.93 mg/dL    Comment:   For patients > or = 73 years of age: The upper reference limit for Creatinine is approximately 13% higher for people identified as African-American.      Calcium 9.6 8.6 - 10.4 mg/dL   GFR, Est African American 58 (L) >=60 mL/min   GFR, Est Non African American 50 (L) >=60 mL/min  Urinalysis, Routine w reflex microscopic     Status: Abnormal   Collection Time: 05/05/16  4:55 PM  Result Value Ref Range   Color, Urine DARK YELLOW YELLOW   APPearance CLEAR CLEAR   Specific Gravity, Urine 1.026 1.001 - 1.035   pH 7.0 5.0 - 8.0   Glucose, UA NEGATIVE NEGATIVE   Bilirubin Urine NEGATIVE NEGATIVE    Comment:  Verified by repeat analysis.   Ketones, ur NEGATIVE NEGATIVE   Hgb urine dipstick NEGATIVE NEGATIVE   Protein, ur TRACE (A) NEGATIVE   Nitrite NEGATIVE NEGATIVE   Leukocytes, UA NEGATIVE NEGATIVE      ASSESSMENT/PLAN:   Continue Diamox but I encourage patient to at least contact neurology and let her physicians there no that she was unable to complete the sleep study and at any rate would not tolerate treatment of sleep apnea if this were present. Ask about follow-up/ next steps from neurology standpoint. Would consider whether postconcussion syndrome may be a factor here - would appreciate neurology's input on this possibility.   Blood pressure holding steady with current regimen, patient is advised that discontinuation of lisinopril may be effecting secondary prevention given her history of coronary artery disease but patient goal is to get the headaches under control.  Other headache syndrome - Unclear etiology at this point. Unable to tolerate sleep study/sleep apnea treatment. Await neurology input,  Elevated serum creatinine - Plan: BASIC METABOLIC PANEL WITH GFR, Urinalysis, Routine w reflex microscopic  Blood pressure instability - Stable on current regimen, advised that need to watch renal function and discontinuation of ACE inhibitor may affect secondary prevention measures  Moderate episode of recurrent major depressive disorder ([place]) - Patient is frustrated with her headache situation, would not like to change medications at this point  Skin lesion - Patient would like referral to dermatology - Plan: Ambulatory referral to Dermatology  Eye problem - Patient states history of spot on eye on the right, would like referral to ophthalmology - Plan: Ambulatory referral to Ophthalmology    Patient Instructions  Plan:  1. Labs today 2. OK to take the Diamox and stop the lisinopril as long as kidney function is ok 3. Call Dr Jaynee Eagles and ask about being seen in spite of no completed  sleep study - consider lumbar puncture     Follow-up plan: Return in about 2 weeks (around 05/19/2016) for Recheck blood pressure.  Visit summary with medication list and pertinent instructions was printed for patient to review, alert [place] if any changes needed. All questions at time of visit were answered - patient instructed to contact office with any additional concerns. ER/RTC precautions were reviewed with the patient and understanding verbalized.

## 2016-05-05 NOTE — Patient Instructions (Addendum)
Plan:  1. Labs today 2. OK to take the Diamox and stop the lisinopril as long as kidney function is ok 3. Call Dr Jaynee Eagles and ask about being seen in spite of no completed sleep study - consider lumbar puncture

## 2016-05-06 LAB — URINALYSIS, ROUTINE W REFLEX MICROSCOPIC
BILIRUBIN URINE: NEGATIVE
GLUCOSE, UA: NEGATIVE
HGB URINE DIPSTICK: NEGATIVE
KETONES UR: NEGATIVE
LEUKOCYTES UA: NEGATIVE
Nitrite: NEGATIVE
Specific Gravity, Urine: 1.026 (ref 1.001–1.035)
pH: 7 (ref 5.0–8.0)

## 2016-05-06 LAB — BASIC METABOLIC PANEL WITH GFR
BUN: 17 mg/dL (ref 7–25)
CHLORIDE: 104 mmol/L (ref 98–110)
CO2: 24 mmol/L (ref 20–31)
Calcium: 9.6 mg/dL (ref 8.6–10.4)
Creat: 1.1 mg/dL — ABNORMAL HIGH (ref 0.60–0.93)
GFR, EST AFRICAN AMERICAN: 58 mL/min — AB (ref >=60)
GFR, EST NON AFRICAN AMERICAN: 50 mL/min — AB (ref >=60)
GLUCOSE: 83 mg/dL (ref 65–99)
POTASSIUM: 4.4 mmol/L (ref 3.5–5.3)
Sodium: 138 mmol/L (ref 135–146)

## 2016-05-09 ENCOUNTER — Other Ambulatory Visit: Payer: Self-pay | Admitting: Osteopathic Medicine

## 2016-05-09 NOTE — Progress Notes (Signed)
HPI: FU coronary artery disease. CABG 2013; pt had PCI of PDA and PLA following CABG. Cardiac catheterization April 2016 showed severe three-vessel coronary disease, patent LIMA to the LAD, patent saphenous vein graft to the ramus intermedius, patent saphenous vein graft to the PDA with patent stents in the PDA beyond the graft insertion site, normal LV systolic function. Echocardiogram November 2016 showed normal LV function, mild LVH, mild left atrial enlargement and mild right ventricular enlargement. Holter monitor January 2017 showed sinus rhythm with possible atrial flutter, PACs, atrial couplets and atrial triplets. I obtained the strips from [place] and felt that it showed sinus with PAT. Anticoagulation discontinued. Nuclear study November 2017 showed ejection fraction 66% with normal perfusion. Since last seen, she occasionally has vague chest pain which is chronic and unchanged. She denies dyspnea, palpitations or syncope. No pedal edema.  Current Outpatient Prescriptions  Medication Sig Dispense Refill  . acetaZOLAMIDE (DIAMOX) 125 MG tablet TAKE 1 TABLET(125 MG) BY MOUTH THREE TIMES DAILY 30 tablet 0  . aspirin 81 MG tablet Take 162 mg by mouth daily.     . carvedilol (COREG) 12.5 MG tablet Take 12.5 mg by mouth 2 (two) times daily.  0  . estradiol (ESTRACE VAGINAL) 0.1 MG/GM vaginal cream Place 4.1-7 Applicatorfuls vaginally 3 (three) times a week. 42.5 g 12  . FLUoxetine (PROZAC) 20 MG capsule Take 1 capsule (20 mg total) by mouth daily. 90 capsule 1  . ipratropium (ATROVENT HFA) 17 MCG/ACT inhaler Inhale 2 puffs into the lungs every 6 (six) hours as needed for wheezing. 1 Inhaler 1  . lisinopril (PRINIVIL,ZESTRIL) 5 MG tablet Take 1 tablet (5 mg total) by mouth 2 (two) times daily. (Patient taking differently: Take 5 mg by mouth daily. ) 90 tablet 0  . nitroGLYCERIN (NITROSTAT) 0.4 MG SL tablet Place 0.4 mg under the tongue every 5 (five) minutes as needed for chest pain.    .  pravastatin (PRAVACHOL) 20 MG tablet Take 1 tablet (20 mg total) by mouth daily. 90 tablet 3  . ranolazine (RANEXA) 500 MG 12 hr tablet Take 1 tablet (500 mg total) by mouth 2 (two) times daily. 90 tablet 2  . Vitamin D, Ergocalciferol, (DRISDOL) 50000 units CAPS capsule Take 1 capsule (50,000 Units total) by mouth every 7 (seven) days. 12 capsule 1   No current facility-administered medications for this visit.      Past Medical History:  Diagnosis Date  . Chronic fatigue   . Depression   . Fibromyalgia   . Heart attack ([place])   . Hx of CABG   . Hyperlipidemia   . Hypertension   . Migraine     Past Surgical History:  Procedure Laterality Date  . ABDOMINAL HYSTERECTOMY    . APPENDECTOMY    . BACK SURGERY    . BACK SURGERY     Lumbar rods  . BLADDER SURGERY    . BREAST LUMPECTOMY    . CARPAL TUNNEL RELEASE Bilateral   . CORONARY ARTERY BYPASS GRAFT     Triple  . LYMPECTOMY  1980  . TONSILLECTOMY    . VAGINA SURGERY    . WRIST SURGERY      Social History   Social History  . Marital status: Married    Spouse name: N/A  . Number of children: 2  . Years of education: 13   Occupational History  . Retired    Social History Main Topics  . Smoking status: Former Smoker  Quit date: 2003  . Smokeless tobacco: Never Used  . Alcohol use Yes     Comment: 1 q wk  . Drug use: No  . Sexual activity: Not on file     Comment: Married   Other Topics Concern  . Not on file   Social History Narrative   Lives at home w/ her husband   Right-handed   Caffeine: 2 cups per day    Family History  Problem Relation Age of Onset  . Cancer Mother   . Hyperlipidemia Father   . Hypertension Father   . Stroke Father   . Heart attack Father   . Hyperlipidemia Maternal Grandmother   . Hypertension Maternal Grandmother   . Stroke Maternal Grandmother   . Hemophilia Maternal Grandfather     ROS: Fatigue, headaches, insomnia but no fevers or chills, productive cough,  hemoptysis, dysphasia, odynophagia, melena, hematochezia, dysuria, hematuria, rash, seizure activity, orthopnea, PND, pedal edema, claudication. Remaining systems are negative.  Physical Exam: Well-developed obese in no acute distress.  Skin is warm and dry.  HEENT is normal.  Neck is supple. No bruits Chest is clear to auscultation with normal expansion.  Cardiovascular exam is regular rate and rhythm. No murmurs Abdominal exam nontender or distended. No masses palpated. Extremities show no edema. neuro grossly intact   A/P  1 Coronary artery disease-patient is status post coronary artery bypass graft. No new chest pain. Continue aspirin and statin. Continue ranexa.  2 hypertension-blood pressure is controlled. Continue present medications.  3 hyperlipidemia-patient has not tolerated high-dose statins, Crestor or Lipitor previously. Continue present dose of pravastatin.  4 question history of atrial flutter-I reviewed rhythm strips previously and there appeared to be brief PAT. Continue beta blocker. I do not think she needs anticoagulation at this point.  Kirk Ruths, MD

## 2016-05-10 NOTE — Procedures (Signed)
Patient was unable to sleep. recording begun at  22.22 hours and stopped at 2;35 AM, when the patient asked to be allowed to leave. No sleep recorded. Will order HST.

## 2016-05-10 NOTE — Addendum Note (Signed)
Addended by: Larey Seat on: 05/10/2016 08:37 AM   Modules accepted: Orders

## 2016-05-12 ENCOUNTER — Telehealth: Payer: Self-pay

## 2016-05-12 ENCOUNTER — Ambulatory Visit (INDEPENDENT_AMBULATORY_CARE_PROVIDER_SITE_OTHER): Payer: Medicare Other | Admitting: Cardiology

## 2016-05-12 ENCOUNTER — Encounter: Payer: Self-pay | Admitting: Cardiology

## 2016-05-12 VITALS — BP 124/68 | HR 80 | Ht 65.0 in | Wt 207.0 lb

## 2016-05-12 DIAGNOSIS — E7800 Pure hypercholesterolemia, unspecified: Secondary | ICD-10-CM

## 2016-05-12 DIAGNOSIS — I10 Essential (primary) hypertension: Secondary | ICD-10-CM | POA: Diagnosis not present

## 2016-05-12 DIAGNOSIS — I2510 Atherosclerotic heart disease of native coronary artery without angina pectoris: Secondary | ICD-10-CM | POA: Diagnosis not present

## 2016-05-12 NOTE — Patient Instructions (Signed)
Your physician wants you to follow-up in: 6 MONTHS WITH DR CRENSHAW You will receive a reminder letter in the mail two months in advance. If you don't receive a letter, please call our office to schedule the follow-up appointment.   If you need a refill on your cardiac medications before your next appointment, please call your pharmacy.  

## 2016-05-12 NOTE — Telephone Encounter (Signed)
-----   Message from Larey Seat, MD sent at 05/10/2016  8:36 AM EDT ----- No sleep recorded. Will order HST.   Cc Dr Jaynee Eagles.

## 2016-05-12 NOTE — Telephone Encounter (Signed)
I called pt to discuss her sleep study results. No answer, left a message asking her to call me back. 

## 2016-05-17 ENCOUNTER — Other Ambulatory Visit: Payer: Self-pay | Admitting: Family Medicine

## 2016-05-17 ENCOUNTER — Other Ambulatory Visit: Payer: Self-pay | Admitting: Osteopathic Medicine

## 2016-05-17 DIAGNOSIS — J209 Acute bronchitis, unspecified: Secondary | ICD-10-CM

## 2016-05-24 ENCOUNTER — Encounter: Payer: Self-pay | Admitting: Osteopathic Medicine

## 2016-05-24 ENCOUNTER — Ambulatory Visit (INDEPENDENT_AMBULATORY_CARE_PROVIDER_SITE_OTHER): Payer: Medicare Other | Admitting: Osteopathic Medicine

## 2016-05-24 VITALS — BP 147/67 | HR 77 | Ht 65.0 in | Wt 208.0 lb

## 2016-05-24 DIAGNOSIS — G4489 Other headache syndrome: Secondary | ICD-10-CM | POA: Diagnosis not present

## 2016-05-24 DIAGNOSIS — Z8669 Personal history of other diseases of the nervous system and sense organs: Secondary | ICD-10-CM

## 2016-05-24 DIAGNOSIS — G629 Polyneuropathy, unspecified: Secondary | ICD-10-CM

## 2016-05-24 LAB — COMPLETE METABOLIC PANEL WITH GFR
ALK PHOS: 53 U/L (ref 33–130)
ALT: 18 U/L (ref 6–29)
AST: 15 U/L (ref 10–35)
Albumin: 4.2 g/dL (ref 3.6–5.1)
BILIRUBIN TOTAL: 0.3 mg/dL (ref 0.2–1.2)
BUN: 18 mg/dL (ref 7–25)
CALCIUM: 9.3 mg/dL (ref 8.6–10.4)
CO2: 19 mmol/L — AB (ref 20–31)
Chloride: 108 mmol/L (ref 98–110)
Creat: 1.01 mg/dL — ABNORMAL HIGH (ref 0.60–0.93)
GFR, EST NON AFRICAN AMERICAN: 56 mL/min — AB (ref >=60)
GFR, Est African American: 64 mL/min (ref >=60)
Glucose, Bld: 83 mg/dL (ref 65–99)
Potassium: 4.1 mmol/L (ref 3.5–5.3)
Sodium: 137 mmol/L (ref 135–146)
TOTAL PROTEIN: 6.8 g/dL (ref 6.1–8.1)

## 2016-05-24 MED ORDER — LISINOPRIL 5 MG PO TABS: 2.5000 mg | ORAL_TABLET | Freq: Every day | ORAL | Status: DC

## 2016-05-24 MED ORDER — PREGABALIN 75 MG PO CAPS: 75.0000 mg | ORAL_CAPSULE | Freq: Every day | ORAL | 0 refills | Status: DC

## 2016-05-24 NOTE — Patient Instructions (Signed)
Call me after a few weeks on the Lyrica and let mw know how you're doing.

## 2016-05-24 NOTE — Progress Notes (Signed)
HPI: Carrie Salazar is a 71 y.o. female  who presents to [facility] today, 05/24/16,  for chief complaint of:  Chief Complaint  Patient presents with  . Other    discuss Lyrica    Headaches: Diamox tid is only tihng that seems to help. Has started taking her old Lyrica qhs which has made a big difference in her headaches. Patient was previously on Lyrica from her provider in [place], was on this for nerve pain/neuropathy in the legs and it was helpful. She has started taking it at night in hopes that it would help the headache and she has noticed a big difference with this. She is taking Lyrica 200 mg daily at bedtime, previously on this 3 times a day. He would like to know if I would be willing to prescribe long-term.  HTN: Cut back on lisinopril to 1/2 tablet daily. Worried about BP dropping too low. Recent cardiology notes reviewed, seen 05/12/2016 by [name]. Continuing aspirin, statin, Ranexa. Continuing current blood pressure medications. To follow-up in 6 months.  Renal function is a concern for this patient, particularly if her goal is to stay on the Diamox. We have been watching renal function closely and patient is due for labs at this point.    Past medical history, surgical history, social history and family history reviewed.  Patient Active Problem List   Diagnosis Date Noted  . Elevated serum creatinine 03/31/2016  . Blood pressure instability 03/31/2016  . Other headache syndrome 02/25/2016  . SOB (shortness of breath) 01/25/2016  . Anemia 01/25/2016  . Vaginal lesion 19-Jan-202017  . Moderate episode of recurrent major depressive disorder ([place]) 12/10/2015  . Female cystocele 10/16/2015  . Urinary urgency 10/16/2015  . History of obstructive sleep apnea 08/20/2015  . Atrial flutter ([place]) 08/20/2015  . 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 Chronic back  pain 08/13/2015  . Recurrent UTI 08/13/2015  . Insomnia 08/13/2015  . Hyperhidrosis 08/13/2015    Current medication list and allergy/intolerance information reviewed.   Current Outpatient Prescriptions on File Prior to Visit  Medication Sig Dispense Refill  . acetaZOLAMIDE (DIAMOX) 125 MG tablet TAKE 1 TABLET(125 MG) BY MOUTH THREE TIMES DAILY 30 tablet 0  . aspirin 81 MG tablet Take 162 mg by mouth daily.     . ATROVENT HFA 17 MCG/ACT inhaler INHALE 2 PUFFS BY MOUTH EVERY 6 HOURS AS NEEDED FOR WHEEZING 12.9 g 0  . carvedilol (COREG) 12.5 MG tablet Take 12.5 mg by mouth 2 (two) times daily.  0  . estradiol (ESTRACE VAGINAL) 0.1 MG/GM vaginal cream Place 2.5-3 Applicatorfuls vaginally 3 (three) times a week. 42.5 g 12  . FLUoxetine (PROZAC) 20 MG capsule Take 1 capsule (20 mg total) by mouth daily. 90 capsule 1  . lisinopril (PRINIVIL,ZESTRIL) 5 MG tablet Take 1 tablet (5 mg total) by mouth 2 (two) times daily. (Patient taking differently: Take 5 mg by mouth daily. ) 90 tablet 0  . nitroGLYCERIN (NITROSTAT) 0.4 MG SL tablet Place 0.4 mg under the tongue every 5 (five) minutes as needed for chest pain.    . pravastatin (PRAVACHOL) 20 MG tablet Take 1 tablet (20 mg total) by mouth daily. 90 tablet 3  . ranolazine (RANEXA) 500 MG 12 hr tablet Take 1 tablet (500 mg total) by mouth 2 (two) times daily. 90 tablet 2  . Vitamin D, Ergocalciferol, (DRISDOL) 50000 units CAPS capsule Take 1  capsule (50,000 Units total) by mouth every 7 (seven) days. 12 capsule 1   No current facility-administered medications on file prior to visit.    Allergies  Allergen Reactions  . Albuterol Palpitations and Other (See Comments)    HALLUCINATIONS Also feels "funny in the head"  . Dilaudid [Hydromorphone] Anaphylaxis    Caused patient to have heart attack and crazy in the head  . Ivp Dye [Iodinated Diagnostic Agents] Other (See Comments)    Itching and causes me to pass out  . Metrizamide Other (See Comments),  Itching, Rash and Hives    Itching and causes me to pass out  . Nitrofuran Derivatives Itching  . Nitrofurantoin Itching  . Percocet [Oxycodone-Acetaminophen] Itching  . Sulfa Antibiotics Hives and Other (See Comments)    Itching and causes me to pass out**PT CAN TAKE BENADRYL 2 HOURS PRIOR AND IS OK**   . Tape Itching  . Levaquin [Levofloxacin In D5w] Other (See Comments)    "I get real dizzy and pass out"  "I get real dizzy and pass out"   . Codeine Itching and Hives  . Diamox [Acetazolamide] Other (See Comments)    Low BP, dizziness  . Lanolin Hives    "eats skin off"  . Latex Other (See Comments)    LEAVES REALLY BAD BURN ON SKIN   . Percodan [Oxycodone-Aspirin]   . Sulfasalazine Hives  . Nickel Rash  . Penicillins Hives  . Sulfamethoxazole Rash and Hives      Review of Systems:  Constitutional: No recent illness  HEENT: +headache - Persistent But overall improved from previous visits. No vision change.  Cardiac: No  chest pain, No  pressure, No palpitations  Respiratory:  No  shortness of breath.  Neurologic: No  weakness, No  Dizziness   Exam:  BP (!) 147/67   Pulse 77   Ht 5\' 5"  (1.651 m)   Wt 208 lb (94.3 kg)   BMI 34.61 kg/m   Constitutional: VS see above. General Appearance: alert, well-developed, well-nourished, NAD  Eyes: Normal lids and conjunctive, non-icteric sclera  Ears, Nose, Mouth, Throat: MMM, Normal external inspection ears/nares/mouth/lips/gums.  Neck: No masses, trachea midline.   Respiratory: Normal respiratory effort. no wheeze, no rhonchi, no rales  Cardiovascular: S1/S2 normal, no murmur, no rub/gallop auscultated. RRR.   Musculoskeletal: Gait normal. Symmetric and independent movement of all extremities  Neurological: Normal balance/coordination. No tremor.  Skin: warm, dry, intact.   Psychiatric: Normal judgment/insight. Normal mood and affect. Oriented x3.   No results found for this or any previous visit (from the past  48 hour(s)).    ASSESSMENT/PLAN:   Okay to continue Lyrica, but I have decreased the dose of that. Caution given possibility of sleep apnea which we have been unable to evaluate since patient cannot tolerate the test due to mask/claustrophobia. Would not want to cause oversedation which can certainly complicate apnea and other comorbidities  Blood pressure okay, continue current medications. Update labs on Diamox  Other headache syndrome - Plan: pregabalin (LYRICA) 75 MG capsule, COMPLETE METABOLIC PANEL WITH GFR  Neuropathy - Plan: pregabalin (LYRICA) 75 MG capsule  History of obstructive sleep apnea    Patient Instructions  Call me after a few weeks on the Lyrica and let mw know how you're doing.       Follow-up plan: Return in about 3 months (around 08/24/2016) for FOLLOW-UP HEADACHES.  Visit summary with medication list and pertinent instructions was printed for patient to review, alert [place] if any  changes needed. All questions at time of visit were answered - patient instructed to contact office with any additional concerns. ER/RTC precautions were reviewed with the patient and understanding verbalized.

## 2016-05-25 NOTE — Telephone Encounter (Signed)
I called pt again to discuss her sleep study results. No answer, left a message asking her to call me back. 

## 2016-05-26 ENCOUNTER — Institutional Professional Consult (permissible substitution): Payer: Medicare Other | Admitting: Pulmonary Disease

## 2016-05-26 DIAGNOSIS — Z85820 Personal history of malignant melanoma of skin: Secondary | ICD-10-CM | POA: Diagnosis not present

## 2016-05-26 DIAGNOSIS — D492 Neoplasm of unspecified behavior of bone, soft tissue, and skin: Secondary | ICD-10-CM | POA: Diagnosis not present

## 2016-05-26 DIAGNOSIS — L821 Other seborrheic keratosis: Secondary | ICD-10-CM | POA: Diagnosis not present

## 2016-05-26 DIAGNOSIS — D229 Melanocytic nevi, unspecified: Secondary | ICD-10-CM | POA: Diagnosis not present

## 2016-05-26 DIAGNOSIS — L570 Actinic keratosis: Secondary | ICD-10-CM | POA: Diagnosis not present

## 2016-05-30 NOTE — Telephone Encounter (Signed)
I called pt again, no answer, left a detailed message on her cell phone, per DPR. I advised her that her sleep study did not capture any sleep and that Dr. Brett Fairy recommends that pt undergo an HST and that our sleep lab will be in touch with her to schedule this. I asked pt to call me back with any questions or concerns.

## 2016-05-31 ENCOUNTER — Other Ambulatory Visit: Payer: Self-pay | Admitting: Osteopathic Medicine

## 2016-06-07 ENCOUNTER — Telehealth: Payer: Self-pay

## 2016-06-07 DIAGNOSIS — G629 Polyneuropathy, unspecified: Secondary | ICD-10-CM

## 2016-06-07 DIAGNOSIS — G4489 Other headache syndrome: Secondary | ICD-10-CM

## 2016-06-07 MED ORDER — PREGABALIN 75 MG PO CAPS: 75.0000 mg | ORAL_CAPSULE | Freq: Every day | ORAL | 1 refills | Status: DC

## 2016-06-07 NOTE — Telephone Encounter (Signed)
Ok to refill 

## 2016-06-07 NOTE — Telephone Encounter (Signed)
OK to refill

## 2016-06-07 NOTE — Telephone Encounter (Signed)
Do you want 30 days?  At this point it is 15 days?

## 2016-06-07 NOTE — Telephone Encounter (Signed)
30 days with 1 refill is fine

## 2016-06-07 NOTE — Telephone Encounter (Signed)
Pt is requesting a refill on Lyrica.  Please advise.

## 2016-06-09 ENCOUNTER — Other Ambulatory Visit: Payer: Self-pay | Admitting: Osteopathic Medicine

## 2016-06-14 ENCOUNTER — Other Ambulatory Visit: Payer: Self-pay | Admitting: Cardiology

## 2016-06-24 DIAGNOSIS — H524 Presbyopia: Secondary | ICD-10-CM | POA: Diagnosis not present

## 2016-06-24 DIAGNOSIS — D3131 Benign neoplasm of right choroid: Secondary | ICD-10-CM | POA: Diagnosis not present

## 2016-07-07 DIAGNOSIS — D229 Melanocytic nevi, unspecified: Secondary | ICD-10-CM | POA: Diagnosis not present

## 2016-07-07 DIAGNOSIS — L821 Other seborrheic keratosis: Secondary | ICD-10-CM | POA: Diagnosis not present

## 2016-08-03 ENCOUNTER — Ambulatory Visit (INDEPENDENT_AMBULATORY_CARE_PROVIDER_SITE_OTHER): Payer: Medicare Other | Admitting: Osteopathic Medicine

## 2016-08-03 ENCOUNTER — Encounter: Payer: Self-pay | Admitting: Osteopathic Medicine

## 2016-08-03 ENCOUNTER — Other Ambulatory Visit: Payer: Self-pay | Admitting: Osteopathic Medicine

## 2016-08-03 VITALS — BP 146/74 | HR 70 | Ht 65.0 in | Wt 215.0 lb

## 2016-08-03 DIAGNOSIS — G629 Polyneuropathy, unspecified: Secondary | ICD-10-CM

## 2016-08-03 DIAGNOSIS — R42 Dizziness and giddiness: Secondary | ICD-10-CM | POA: Diagnosis not present

## 2016-08-03 DIAGNOSIS — G4489 Other headache syndrome: Secondary | ICD-10-CM

## 2016-08-03 DIAGNOSIS — E559 Vitamin D deficiency, unspecified: Secondary | ICD-10-CM | POA: Diagnosis not present

## 2016-08-03 MED ORDER — MECLIZINE HCL 25 MG PO TABS: 25.0000 mg | ORAL_TABLET | Freq: Three times a day (TID) | ORAL | 0 refills | Status: DC | PRN

## 2016-08-03 MED ORDER — NAPROXEN 375 MG PO TABS
375.0000 mg | ORAL_TABLET | Freq: Two times a day (BID) | ORAL | 1 refills | Status: DC
Start: 2016-08-03 — End: 2016-09-10

## 2016-08-03 NOTE — Patient Instructions (Addendum)
Plan: Let's get you in to see Dr Jaynee Eagles if this dizziness isn't better. If worse, please seek medical attention!  Let's get labs today.  Try medications (Meclizine) as needed  Schedule appointment with Dr Georgina Snell for joint pain

## 2016-08-03 NOTE — Progress Notes (Signed)
HPI: Carrie Salazar is a 71 y.o. female  who presents to [facility] today, 08/03/16,  for chief complaint of:  Chief Complaint  Patient presents with  . Dizziness     . Context: hx headache syndrome, no headaches now and she is worried. Has been adjusting doses of her medications since she was feeling lightheaded and home BP was low.  . Quality: lightheaded, wooozy, doesn't feel like spinning . Severity: getting a little bit better today  . Duration: worse over past 3 daysbut present altogether maybe a week . Modifying factors: worse with leaning over  . Assoc signs/symptoms: no headache (yay!)    Past medical, surgical, social and family history reviewed: Patient Active Problem List   Diagnosis Date Noted  . Neuropathy 05/24/2016  . Elevated serum creatinine 03/31/2016  . Blood pressure instability 03/31/2016  . Other headache syndrome 02/25/2016  . SOB (shortness of breath) 01/25/2016  . Anemia 01/25/2016  . Vaginal lesion May 13, 202017  . Moderate episode of recurrent major depressive disorder ([place]) 12/10/2015  . Female cystocele 10/16/2015  . Urinary urgency 10/16/2015  . History of obstructive sleep apnea 08/20/2015  . Atrial flutter ([place]) 08/20/2015  . 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 Chronic back pain 08/13/2015  . Recurrent UTI 08/13/2015  . Insomnia 08/13/2015  . Hyperhidrosis 08/13/2015   Past Surgical History:  Procedure Laterality Date  . ABDOMINAL HYSTERECTOMY    . APPENDECTOMY    . BACK SURGERY    . BACK SURGERY     Lumbar rods  . BLADDER SURGERY    . BREAST LUMPECTOMY    . CARPAL TUNNEL RELEASE Bilateral   . CORONARY ARTERY BYPASS GRAFT     Triple  . LYMPECTOMY  1980  . TONSILLECTOMY    . VAGINA SURGERY    . WRIST SURGERY     Social History  Substance Use Topics  . Smoking status: Former Smoker    Quit date: 2003  . Smokeless  tobacco: Never Used  . Alcohol use Yes     Comment: 1 q wk   Family History  Problem Relation Age of Onset  . Cancer Mother   . Hyperlipidemia Father   . Hypertension Father   . Stroke Father   . Heart attack Father   . Hyperlipidemia Maternal Grandmother   . Hypertension Maternal Grandmother   . Stroke Maternal Grandmother   . Hemophilia Maternal Grandfather      Current medication list and allergy/intolerance information reviewed:   Current Outpatient Prescriptions  Medication Sig Dispense Refill  . acetaZOLAMIDE (DIAMOX) 125 MG tablet TAKE 1 TABLET(125 MG) BY MOUTH THREE TIMES DAILY 90 tablet 3  . aspirin 81 MG tablet Take 162 mg by mouth daily.     . ATROVENT HFA 17 MCG/ACT inhaler INHALE 2 PUFFS BY MOUTH EVERY 6 HOURS AS NEEDED FOR WHEEZING 12.9 g 0  . carvedilol (COREG) 12.5 MG tablet Take 12.5 mg by mouth 2 (two) times daily.  0  . estradiol (ESTRACE VAGINAL) 0.1 MG/GM vaginal cream Place 0.1-0 Applicatorfuls vaginally 3 (three) times a week. 42.5 g 12  . FLUoxetine (PROZAC) 20 MG capsule Take 1 capsule (20 mg total) by mouth daily. 90 capsule 1  . lisinopril (PRINIVIL,ZESTRIL) 5 MG tablet Take 0.5 tablets (2.5 mg total) by mouth daily.    Marland Kitchen LYRICA 75 MG capsule TAKE ONE CAPSULE BY MOUTH DAILY 30 capsule 0  . nitroGLYCERIN (  NITROSTAT) 0.4 MG SL tablet Place 0.4 mg under the tongue every 5 (five) minutes as needed for chest pain.    . pravastatin (PRAVACHOL) 20 MG tablet Take 1 tablet (20 mg total) by mouth daily. 90 tablet 3  . RANEXA 500 MG 12 hr tablet TAKE 1 TABLET BY MOUTH TWO  TIMES DAILY 180 tablet 3  . Vitamin D, Ergocalciferol, (DRISDOL) 50000 units CAPS capsule Take 1 capsule (50,000 Units total) by mouth every 7 (seven) days. 12 capsule 1   No current facility-administered medications for this visit.    Allergies  Allergen Reactions  . Albuterol Palpitations and Other (See Comments)    HALLUCINATIONS Also feels "funny in the head"  . Dilaudid [Hydromorphone]  Anaphylaxis    Caused patient to have heart attack and crazy in the head  . Ivp Dye [Iodinated Diagnostic Agents] Other (See Comments)    Itching and causes me to pass out  . Metrizamide Other (See Comments), Itching, Rash and Hives    Itching and causes me to pass out  . Nitrofuran Derivatives Itching  . Nitrofurantoin Itching  . Percocet [Oxycodone-Acetaminophen] Itching  . Sulfa Antibiotics Hives and Other (See Comments)    Itching and causes me to pass out**PT CAN TAKE BENADRYL 2 HOURS PRIOR AND IS OK**   . Tape Itching  . Levaquin [Levofloxacin In D5w] Other (See Comments)    "I get real dizzy and pass out"  "I get real dizzy and pass out"   . Codeine Itching and Hives  . Lanolin Hives    "eats skin off"  . Latex Other (See Comments)    LEAVES REALLY BAD BURN ON SKIN   . Percodan [Oxycodone-Aspirin]   . Sulfasalazine Hives  . Nickel Rash  . Penicillins Hives  . Sulfamethoxazole Rash and Hives      Review of Systems:  Constitutional:  No  fever, no chills, No recent illness, No unintentional weight changes. No significant fatigue.   HEENT: No  headache, no vision change, no hearing change, No sore throat, No  sinus pressure  Cardiac: No  chest pain, No  pressure, No palpitations  Respiratory:  No  shortness of breath. No  Cough  Gastrointestinal: No  abdominal pain, No  nausea, No  vomiting  Musculoskeletal: No new myalgia/arthralgia  Skin: No  Rash  Neurologic: No  weakness, +dizziness, No  slurred speech/focal weakness/facial droop  Psychiatric: No  concerns with depression, No  concerns with anxiety  Exam:  BP (!) 146/74   Pulse 70   Ht 5\' 5"  (1.651 m)   Wt 215 lb (97.5 kg)   BMI 35.78 kg/m   Orthostatic VS for the past 24 hrs:  BP- Lying Pulse- Lying BP- Sitting Pulse- Sitting BP- Standing at 0 minutes Pulse- Standing at 0 minutes  08/03/16 1641 144/75 61 136/67 65 150/79 67     Constitutional: VS see above. General Appearance: alert,  well-developed, well-nourished, NAD  Eyes: Normal lids and conjunctive, non-icteric sclera  Ears, Nose, Mouth, Throat: MMM, Normal external inspection ears/nares/mouth/lips/gums.   Neck: No masses, trachea midline. No thyroid enlargement.   Respiratory: Normal respiratory effort. no wheeze, no rhonchi, no rales  Cardiovascular: S1/S2 normal, no murmur, no rub/gallop auscultated. RRR.   Musculoskeletal: Gait normal. No clubbing/cyanosis of digits.   Neurological: Normal balance/coordination. No tremor. No cranial nerve deficit on limited exam. Motor and sensation intact and symmetric. Cerebellar reflexes intact. Modified dix-hallpike negative. EOMI  Skin: warm, dry, intact. No rash/ulcer.   Psychiatric:  Normal judgment/insight. Normal mood and affect. Oriented x3.     ASSESSMENT/PLAN:   Dizziness - orthostatic VS surprisingly negative based on symptoms, but positional changes cause the sensation. No concerning cardiac or respiratory symptoms. Reassuring that she is actually feeling a bit better today. Will check labs and if no obvious answer refer to neurology to help guide [place] with imaging decisions or other testing. Trial meclizine. ER precautions reviewed in detail.     Patient Instructions  Plan: Let's get you in to see Dr Jaynee Eagles if this dizziness isn't better. If worse, please seek medical attention!  Let's get labs today.  Try medications (Meclizine) as needed  Schedule appointment with Dr Georgina Snell for joint pain   Orders Placed This Encounter  Procedures  . CBC with Differential/Platelet  . COMPLETE METABOLIC PANEL WITH GFR  . TSH  . Vitamin B12  . VITAMIN D 25 Hydroxy (Vit-D Deficiency, Fractures)     Visit summary with medication list and pertinent instructions was printed for patient to review. All questions at time of visit were answered - patient instructed to contact office with any additional concerns. ER/RTC precautions were reviewed with the patient. Follow-up  plan: Return in about 1 week (around 08/10/2016) for recheck dizziness if needed .

## 2016-08-08 DIAGNOSIS — R42 Dizziness and giddiness: Secondary | ICD-10-CM | POA: Diagnosis not present

## 2016-08-09 DIAGNOSIS — E559 Vitamin D deficiency, unspecified: Secondary | ICD-10-CM | POA: Insufficient documentation

## 2016-08-09 LAB — COMPLETE METABOLIC PANEL WITH GFR
ALT: 13 U/L (ref 6–29)
AST: 12 U/L (ref 10–35)
Albumin: 3.8 g/dL (ref 3.6–5.1)
Alkaline Phosphatase: 56 U/L (ref 33–130)
BILIRUBIN TOTAL: 0.2 mg/dL (ref 0.2–1.2)
BUN: 19 mg/dL (ref 7–25)
CALCIUM: 8.4 mg/dL — AB (ref 8.6–10.4)
CHLORIDE: 109 mmol/L (ref 98–110)
CO2: 22 mmol/L (ref 20–31)
CREATININE: 1.1 mg/dL — AB (ref 0.60–0.93)
GFR, EST AFRICAN AMERICAN: 58 mL/min — AB (ref >=60)
GFR, EST NON AFRICAN AMERICAN: 50 mL/min — AB (ref >=60)
Glucose, Bld: 74 mg/dL (ref 65–99)
Potassium: 4.2 mmol/L (ref 3.5–5.3)
Sodium: 140 mmol/L (ref 135–146)
TOTAL PROTEIN: 5.7 g/dL — AB (ref 6.1–8.1)

## 2016-08-09 LAB — CBC WITH DIFFERENTIAL/PLATELET
BASOS ABS: 0 {cells}/uL (ref 0–200)
BASOS PCT: 0 %
EOS ABS: 216 {cells}/uL (ref 15–500)
Eosinophils Relative: 4 %
HEMATOCRIT: 37.1 % (ref 35.0–45.0)
Hemoglobin: 11.9 g/dL (ref 11.7–15.5)
LYMPHS PCT: 36 %
Lymphs Abs: 1944 cells/uL (ref 850–3900)
MCH: 30.8 pg (ref 27.0–33.0)
MCHC: 32.1 g/dL (ref 32.0–36.0)
MCV: 96.1 fL (ref 80.0–100.0)
MONO ABS: 486 {cells}/uL (ref 200–950)
MONOS PCT: 9 %
MPV: 9.1 fL (ref 7.5–12.5)
Neutro Abs: 2754 cells/uL (ref 1500–7800)
Neutrophils Relative %: 51 %
PLATELETS: 236 10*3/uL (ref 140–400)
RBC: 3.86 MIL/uL (ref 3.80–5.10)
RDW: 14 % (ref 11.0–15.0)
WBC: 5.4 10*3/uL (ref 3.8–10.8)

## 2016-08-09 LAB — VITAMIN D 25 HYDROXY (VIT D DEFICIENCY, FRACTURES): Vit D, 25-Hydroxy: 20 ng/mL — ABNORMAL LOW (ref 30–100)

## 2016-08-09 LAB — TSH: TSH: 1.49 m[IU]/L

## 2016-08-09 LAB — VITAMIN B12: VITAMIN B 12: 1848 pg/mL — AB (ref 200–1100)

## 2016-08-09 MED ORDER — VITAMIN D (ERGOCALCIFEROL) 1.25 MG (50000 UNIT) PO CAPS: 50000.0000 [IU] | ORAL_CAPSULE | ORAL | 0 refills | Status: DC

## 2016-08-09 NOTE — Addendum Note (Signed)
Addended by: Maryla Morrow on: 08/09/2016 12:46 PM   Modules accepted: Orders

## 2016-08-11 ENCOUNTER — Institutional Professional Consult (permissible substitution): Payer: Medicare Other | Admitting: Pulmonary Disease

## 2016-08-16 ENCOUNTER — Encounter: Payer: Self-pay | Admitting: Osteopathic Medicine

## 2016-08-16 DIAGNOSIS — R5383 Other fatigue: Secondary | ICD-10-CM

## 2016-08-16 HISTORY — DX: Other fatigue: R53.83

## 2016-08-18 DIAGNOSIS — L820 Inflamed seborrheic keratosis: Secondary | ICD-10-CM | POA: Diagnosis not present

## 2016-09-02 ENCOUNTER — Other Ambulatory Visit: Payer: Self-pay | Admitting: Osteopathic Medicine

## 2016-09-02 DIAGNOSIS — G629 Polyneuropathy, unspecified: Secondary | ICD-10-CM

## 2016-09-02 DIAGNOSIS — G4489 Other headache syndrome: Secondary | ICD-10-CM

## 2016-09-10 ENCOUNTER — Other Ambulatory Visit: Payer: Self-pay | Admitting: Osteopathic Medicine

## 2016-09-13 ENCOUNTER — Other Ambulatory Visit: Payer: Self-pay | Admitting: Osteopathic Medicine

## 2016-09-13 DIAGNOSIS — G5793 Unspecified mononeuropathy of bilateral lower limbs: Secondary | ICD-10-CM

## 2016-09-16 ENCOUNTER — Other Ambulatory Visit: Payer: Self-pay | Admitting: Osteopathic Medicine

## 2016-09-22 ENCOUNTER — Other Ambulatory Visit: Payer: Self-pay | Admitting: Osteopathic Medicine

## 2016-09-29 ENCOUNTER — Ambulatory Visit (INDEPENDENT_AMBULATORY_CARE_PROVIDER_SITE_OTHER): Payer: Medicare Other | Admitting: Osteopathic Medicine

## 2016-09-29 ENCOUNTER — Encounter: Payer: Self-pay | Admitting: Osteopathic Medicine

## 2016-09-29 VITALS — BP 143/84 | HR 62 | Wt 220.0 lb

## 2016-09-29 DIAGNOSIS — E559 Vitamin D deficiency, unspecified: Secondary | ICD-10-CM

## 2016-09-29 DIAGNOSIS — Z23 Encounter for immunization: Secondary | ICD-10-CM

## 2016-09-29 DIAGNOSIS — Z981 Arthrodesis status: Secondary | ICD-10-CM

## 2016-09-29 DIAGNOSIS — M25552 Pain in left hip: Secondary | ICD-10-CM | POA: Diagnosis not present

## 2016-09-29 DIAGNOSIS — Z9689 Presence of other specified functional implants: Secondary | ICD-10-CM

## 2016-09-29 DIAGNOSIS — Z9889 Other specified postprocedural states: Secondary | ICD-10-CM | POA: Diagnosis not present

## 2016-09-29 NOTE — Patient Instructions (Addendum)
If hip and lower back is not better or if it gets worse, I would recommend follow-up with Dr Georgina Snell for further evaluation in 2-4 weeks. In the meantime, physical therapy, Xrays today, continue current medications.

## 2016-09-29 NOTE — Progress Notes (Signed)
HPI: Carrie Salazar is a 71 y.o. female  who presents to [facility] today, 09/29/16,  for chief complaint of:  Chief Complaint  Patient presents with  . Follow-up    depression and Neuropathy(its better)  . Hip Pain  . Back Pain    Back/hip pain . Context: Hx anterior/posterior fusion L4/L5 w/ 3 surgeries in total.  . Location: L hip and L lower back. Radiating a bit into RLQ/Groin  . Quality:  . Duration: 6 months  . Modifying factors: previously treated with injections and surgeries . Assoc signs/symptoms: shooting pain down L leg     Past medical, surgical, social and family history reviewed: Patient Active Problem List   Diagnosis Date Noted  . Fatigue 08/16/2016  . Vitamin D deficiency 08/09/2016  . Neuropathy 05/24/2016  . Elevated serum creatinine 03/31/2016  . Blood pressure instability 03/31/2016  . Other headache syndrome 02/25/2016  . SOB (shortness of breath) 01/25/2016  . Anemia 01/25/2016  . Vaginal lesion 01-07-2016  . Moderate episode of recurrent major depressive disorder ([place]) 12/10/2015  . Female cystocele 10/16/2015  . Urinary urgency 10/16/2015  . History of obstructive sleep apnea 08/20/2015  . Atrial flutter ([place]) 08/20/2015  . 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 Chronic back pain 08/13/2015  . Recurrent UTI 08/13/2015  . Insomnia 08/13/2015  . Hyperhidrosis 08/13/2015   Past Surgical History:  Procedure Laterality Date  . ABDOMINAL HYSTERECTOMY    . APPENDECTOMY    . BACK SURGERY    . BACK SURGERY     Lumbar rods  . BLADDER SURGERY    . BREAST LUMPECTOMY    . CARPAL TUNNEL RELEASE Bilateral   . CORONARY ARTERY BYPASS GRAFT     Triple  . LYMPECTOMY  1980  . TONSILLECTOMY    . VAGINA SURGERY    . WRIST SURGERY     Social History  Substance Use Topics  . Smoking status: Former Smoker    Quit date: 2003  . Smokeless  tobacco: Never Used  . Alcohol use Yes     Comment: 1 q wk   Family History  Problem Relation Age of Onset  . Cancer Mother   . Hyperlipidemia Father   . Hypertension Father   . Stroke Father   . Heart attack Father   . Hyperlipidemia Maternal Grandmother   . Hypertension Maternal Grandmother   . Stroke Maternal Grandmother   . Hemophilia Maternal Grandfather      Current medication list and allergy/intolerance information reviewed:   Current Outpatient Prescriptions  Medication Sig Dispense Refill  . acetaZOLAMIDE (DIAMOX) 125 MG tablet TAKE 1 TABLET(125 MG) BY MOUTH THREE TIMES DAILY 90 tablet 3  . aspirin 81 MG tablet Take 162 mg by mouth daily.     . ATROVENT HFA 17 MCG/ACT inhaler INHALE 2 PUFFS BY MOUTH EVERY 6 HOURS AS NEEDED FOR WHEEZING 12.9 g 0  . carvedilol (COREG) 12.5 MG tablet Take 6 mg by mouth 2 (two) times daily.   0  . estradiol (ESTRACE VAGINAL) 0.1 MG/GM vaginal cream Place 8.1-8 Applicatorfuls vaginally 3 (three) times a week. 42.5 g 12  . FLUoxetine (PROZAC) 20 MG capsule Take 1 capsule (20 mg total) by mouth daily. (Patient taking differently: Take 10 mg by mouth daily. ) 90 capsule 1  . gabapentin (NEURONTIN) 600 MG tablet TAKE 1 TABLET( 600 MG TOTAL) BY MOUTH IN THE MORNING AND AFTERNOON.  IN EVENING CAN TAKE 2 TABLETS( 1200 MG TOTAL) 120 tablet 0  . lisinopril (PRINIVIL,ZESTRIL) 5 MG tablet Take 0.5 tablets (2.5 mg total) by mouth daily.    Marland Kitchen lisinopril (PRINIVIL,ZESTRIL) 5 MG tablet TAKE 1 TABLET(5 MG) BY MOUTH DAILY 90 tablet 0  . LYRICA 75 MG capsule TAKE ONE CAPSULE BY MOUTH DAILY 30 capsule 0  . meclizine (ANTIVERT) 25 MG tablet TAKE 1 TABLET(25 MG) BY MOUTH THREE TIMES DAILY AS NEEDED FOR DIZZINESS 30 tablet 0  . naproxen (NAPROSYN) 375 MG tablet Take 1 tablet (375 mg total) by mouth 2 (two) times daily with a meal. Patient needs to schedule a follow up appointment with PCP before more refills. 30 tablet 0  . nitroGLYCERIN (NITROSTAT) 0.4 MG SL tablet  Place 0.4 mg under the tongue every 5 (five) minutes as needed for chest pain.    . pravastatin (PRAVACHOL) 20 MG tablet Take 1 tablet (20 mg total) by mouth daily. 90 tablet 3  . RANEXA 500 MG 12 hr tablet TAKE 1 TABLET BY MOUTH TWO  TIMES DAILY 180 tablet 3  . Vitamin D, Ergocalciferol, (DRISDOL) 50000 units CAPS capsule Take 1 capsule (50,000 Units total) by mouth every 7 (seven) days. Take for 8 total doses(weeks) 8 capsule 0   No current facility-administered medications for this visit.    Allergies  Allergen Reactions  . Albuterol Palpitations and Other (See Comments)    HALLUCINATIONS Also feels "funny in the head"  . Dilaudid [Hydromorphone] Anaphylaxis    Caused patient to have heart attack and crazy in the head  . Ivp Dye [Iodinated Diagnostic Agents] Other (See Comments)    Itching and causes me to pass out  . Metrizamide Other (See Comments), Itching, Rash and Hives    Itching and causes me to pass out  . Percocet [Oxycodone-Acetaminophen] Itching  . Sulfa Antibiotics Hives and Other (See Comments)    Itching and causes me to pass out**PT CAN TAKE BENADRYL 2 HOURS PRIOR AND IS OK**   . Tape Itching  . Levaquin [Levofloxacin In D5w] Other (See Comments)    "I get real dizzy and pass out"  "I get real dizzy and pass out"   . Codeine Itching and Hives  . Lanolin Hives    "eats skin off"  . Latex Other (See Comments)    LEAVES REALLY BAD BURN ON SKIN   . Percodan [Oxycodone-Aspirin]   . Sulfasalazine Hives  . Nickel Rash  . Penicillins Hives  . Sulfamethoxazole Rash and Hives      Review of Systems:  Constitutional:  No  fever, no chills, No recent illnes  HEENT: No  headache, no vision change, no hearing change  Cardiac: No  chest pain, No  pressure, No palpitations,   Respiratory:  No  shortness of breath. No  Cough  Gastrointestinal: No  abdominal pain, No  nausea, No  vomiting  Musculoskeletal: No new myalgia/arthralgia  Skin: No  Rash,   Neurologic:  No  weakness, No  dizziness,   Psychiatric: No  concerns with depression, No  concerns with anxiety, No sleep problems, No mood problems  Exam:  BP (!) 143/84   Pulse 62   Wt 220 lb (99.8 kg)   BMI 36.61 kg/m   Constitutional: VS see above. General Appearance: alert, well-developed, well-nourished, NAD  Eyes: Normal lids and conjunctive, non-icteric sclera  Ears, Nose, Mouth, Throat: MMM, Normal external inspection ears/nares/mouth/lips/gums.   Neck: No masses, trachea midline. No thyroid enlargement. N  Respiratory: Normal respiratory effort. no wheeze, no rhonchi, no rales  Cardiovascular: S1/S2 normal, no murmur, no rub/gallop auscultated. RRR.   Musculoskeletal: Gait normal. No clubbing/cyanosis of digits. (+) hip pain on internal rotation, negative SLR and Fabere  Neurological: Normal balance/coordination. No tremor. No cranial nerve deficit on limited exam. Motor and sensation intact and symmetric. Cerebellar reflexes intact.   Skin: warm, dry, intact. No rash/ulcer.   Psychiatric: Normal judgment/insight. Normal mood and affect. Oriented x3.     ASSESSMENT/PLAN:   Hip pain, left - Plan: Ambulatory referral to Physical Therapy, DG Lumbar Spine 2-3 Views, DG HIP UNILAT W OR W/O PELVIS 2-3 VIEWS LEFT  S/P insertion of spinal cord stimulator - Plan: Ambulatory referral to Neurosurgery  History of lumbar fusion - Plan: Ambulatory referral to Neurosurgery  Vitamin D deficiency - Plan: VITAMIN D 25 Hydroxy (Vit-D Deficiency, Fractures)  Need for immunization against influenza - Plan: Flu vaccine HIGH DOSE PF    Patient Instructions  If hip and lower back is not better or if it gets worse, I would recommend follow-up with Dr Georgina Snell for further evaluation in 2-4 weeks. In the meantime, physical therapy, Xrays today, continue current medications.      Visit summary with medication list and pertinent instructions was printed for patient to review. All questions at time  of visit were answered - patient instructed to contact office with any additional concerns. ER/RTC precautions were reviewed with the patient. Follow-up plan: Return in about 3 months (around 12/29/2016) for recheck blood pressure .  Note: Total time spent 25 minutes, greater than 50% of the visit was spent face-to-face counseling and coordinating care for the following: The primary encounter diagnosis was Hip pain, left. Diagnoses of S/P insertion of spinal cord stimulator, History of lumbar fusion, Vitamin D deficiency, and Need for immunization against influenza were also pertinent to this visit.Marland Kitchen

## 2016-10-03 ENCOUNTER — Encounter: Payer: Self-pay | Admitting: Family Medicine

## 2016-10-03 ENCOUNTER — Ambulatory Visit (INDEPENDENT_AMBULATORY_CARE_PROVIDER_SITE_OTHER): Payer: Medicare Other | Admitting: Family Medicine

## 2016-10-03 ENCOUNTER — Encounter: Payer: Medicare Other | Admitting: Family Medicine

## 2016-10-03 VITALS — BP 158/63 | HR 63 | Wt 220.0 lb

## 2016-10-03 DIAGNOSIS — M7062 Trochanteric bursitis, left hip: Secondary | ICD-10-CM | POA: Diagnosis not present

## 2016-10-03 DIAGNOSIS — M79644 Pain in right finger(s): Secondary | ICD-10-CM

## 2016-10-03 NOTE — Patient Instructions (Signed)
Thank you for coming in today. Call or go to the ER if you develop a large red swollen joint with extreme pain or oozing puss.  Recheck as needed.  Do the exercises for the hip that we discussed.

## 2016-10-03 NOTE — Progress Notes (Signed)
Subjective:    I'm seeing this patient as a consultation for: Dr. [name]  CC: Left hip and right thumb pain  HPI: Left hip pain: Patient reports left hip pain for around 1 year which has recently worsened. Patient reports that pain is especially bad when climbing stairs, walking for extended periods of time, or lying on left side. Patient has a history of multiple back surgeries. Patient localizes the pain to area of greater trochanter. Patient has received steroid injections for similar symptoms in the past which have relieved her symptoms.  Right thumb pain: Patient reports right thumb pain for 6 months. She states the the pain is worse when attempting to grip or pick-up different objects. Patient has a history of wrist surgery after fracture approximately 2 years ago. She has not had significant problems since this time. Patient reports that she has previously received an injection at the base of her thumb which relieved the pain for about 3 months. Patient denies any repetitive movements that may have aggravated her pain.    Past medical history, Surgical history, Family history not pertinant except as noted below, Social history, Allergies, and medications have been entered into the medical record, reviewed, and no changes needed.   Review of Systems: No headache, visual changes, nausea, vomiting, diarrhea, constipation, dizziness, abdominal pain, skin rash, fevers, chills, night sweats, weight loss, swollen lymph nodes, body aches, joint swelling, muscle aches, chest pain, shortness of breath, mood changes, visual or auditory hallucinations.   Objective:    Vitals:   10/03/16 1509  BP: (!) 158/63  Pulse: 63   General: Well Developed, well nourished, and in no acute distress.  Neuro/Psych: Alert and oriented x3, extra-ocular muscles intact, able to move all 4 extremities, sensation grossly intact. Skin: Warm and dry, no rashes noted.  Respiratory: Not using accessory  muscles, speaking in full sentences, trachea midline.  Cardiovascular: Pulses palpable, no extremity edema. Abdomen: Does not appear distended. MSK: Left hip: No gross deformity or edema on inspection Tenderness to palpation around area of greater trochanter Range of motion is limited due to weakness, patient is unable to abduct leg while lying on right side Strength is decreased with abduction on left side  Right thumb: No gross deformity or swelling on inspection No tenderness to palpation Range of motion is limited due to pain, opposition of thumb is particularly painful   Hip greater trochanteric injection: left Consent obtained and timeout performed. Area of maximum tenderness palpated and identified. Skin cleaned with alcohol, cold spray applied. A spinal needle was used to access the greater trochanteric bursa. 80 mg of Depo-Medrol, and 4 mL of Marcaine were used to inject the trochanteric bursa. Patient tolerated the procedure well.  Procedure: Real-time Ultrasound Guided Injection of right 1st MCP  Device: GE Logiq E  Images permanently stored and available for review in the ultrasound unit. Verbal informed consent obtained. Discussed risks and benefits of procedure. Warned about infection bleeding damage to structures skin hypopigmentation and fat atrophy among others. Patient expresses understanding and agreement Time-out conducted.  Noted no overlying erythema, induration, or other signs of local infection.  Skin prepped in a sterile fashion.  Local anesthesia: Topical Ethyl chloride.  With sterile technique and under real time ultrasound guidance: 0.5mg  dexamethasone and 0.72ml lidocaine injected easily.  Completed without difficulty  Pain immediately resolved suggesting accurate placement of the medication.  Advised to call if fevers/chills, erythema, induration, drainage, or persistent bleeding.  Images permanently stored and available for  review in the  ultrasound unit.  Impression: Technically successful ultrasound guided injection.    No results found for this or any previous visit (from the past 24 hour(s)). No results found.  Impression and Recommendations:    Assessment and Plan: 71 y.o. female with left hip and right thumb pain. Her left hip pain is most likely due to greater trochanteric bursitis. Patient received a steroid injection in clinic today. She was also given exercises to strengthen her hip abductor muscles.  For right thumb pain, patient received a steroid injection in her MCP joint. This injection was performed under ultrasound guidance with the application of traction.   Patient to follow-up in clinic as needed if symptoms do not improve in the next few weeks or if symptoms recur.   No orders of the defined types were placed in this encounter.  No orders of the defined types were placed in this encounter.   Discussed warning signs or symptoms. Please see discharge instructions. Patient expresses understanding.

## 2016-10-04 ENCOUNTER — Encounter: Payer: Medicare Other | Admitting: Family Medicine

## 2016-10-07 ENCOUNTER — Other Ambulatory Visit: Payer: Self-pay | Admitting: Osteopathic Medicine

## 2016-10-07 DIAGNOSIS — G5793 Unspecified mononeuropathy of bilateral lower limbs: Secondary | ICD-10-CM

## 2016-10-12 ENCOUNTER — Other Ambulatory Visit: Payer: Self-pay | Admitting: Osteopathic Medicine

## 2016-10-12 DIAGNOSIS — N390 Urinary tract infection, site not specified: Secondary | ICD-10-CM

## 2016-10-12 DIAGNOSIS — N952 Postmenopausal atrophic vaginitis: Secondary | ICD-10-CM

## 2016-10-18 ENCOUNTER — Other Ambulatory Visit: Payer: Self-pay | Admitting: Osteopathic Medicine

## 2016-10-18 DIAGNOSIS — F331 Major depressive disorder, recurrent, moderate: Secondary | ICD-10-CM

## 2016-10-23 ENCOUNTER — Other Ambulatory Visit: Payer: Self-pay | Admitting: Osteopathic Medicine

## 2016-10-26 ENCOUNTER — Ambulatory Visit: Payer: Medicare Other

## 2016-10-26 ENCOUNTER — Emergency Department
Admission: EM | Admit: 2016-10-26 | Discharge: 2016-10-26 | Disposition: A | Discharge status: Home / Self Care | Payer: Medicare Other | Source: Home / Self Care

## 2016-10-26 DIAGNOSIS — G4700 Insomnia, unspecified: Secondary | ICD-10-CM

## 2016-10-26 DIAGNOSIS — R300 Dysuria: Secondary | ICD-10-CM

## 2016-10-26 LAB — POCT URINALYSIS DIP (MANUAL ENTRY)
Blood, UA: NEGATIVE
Glucose, UA: NEGATIVE mg/dL
Ketones, POC UA: NEGATIVE mg/dL
LEUKOCYTES UA: NEGATIVE
NITRITE UA: NEGATIVE
PH UA: 6 (ref 5.0–8.0)
PROTEIN UA: NEGATIVE mg/dL
Spec Grav, UA: 1.025 (ref 1.010–1.025)
Urobilinogen, UA: 0.2 E.U./dL

## 2016-10-26 MED ORDER — HYDROXYZINE HCL 25 MG PO TABS: 25.0000 mg | ORAL_TABLET | Freq: Every day | ORAL | 0 refills | Status: DC

## 2016-10-26 NOTE — ED Triage Notes (Signed)
Pt c/o dysuria, urinary frequency and urgency, fever, chills and nausea for 1 week. HA's for months after falling backwards and hitting her head on the corner of a cabinet. Also having insomnia for years.

## 2016-10-26 NOTE — ED Provider Notes (Signed)
[facility]    CSN: [id number] Arrival date & time: 10/26/16  1757     History   Chief Complaint Chief Complaint  Patient presents with  . Urinary Tract Infection  . Insomnia  . Headache    HPI Carrie Salazar is a 71 y.o. female.   The history is provided by the patient. No language interpreter was used.  Urinary Tract Infection  Pain quality:  Aching Pain severity:  Moderate Onset quality:  Gradual Duration:  1 week Timing:  Constant Progression:  Worsening Chronicity:  New Recent urinary tract infections: no   Relieved by:  Nothing Worsened by:  Nothing Ineffective treatments:  None tried Urinary symptoms: no discolored urine   Associated symptoms: no vomiting   Insomnia  Associated symptoms include headaches.  Headache  Associated symptoms: no vomiting   Pt complains of increased frequency and urgency.  Pt reports she as had urinary tract infections in the past.  Pt also  reports she has insomnia.  Pt complains of headaches since hitting head in July.   Past Medical History:  Diagnosis Date  . Chronic fatigue   . Depression   . Fatigue 08/16/2016  . Fibromyalgia   . Heart attack ([place])   . Hx of CABG   . Hyperlipidemia   . Hypertension   . Migraine     Patient Active Problem List   Diagnosis Date Noted  . Fatigue 08/16/2016  . Vitamin D deficiency 08/09/2016  . Neuropathy 05/24/2016  . Elevated serum creatinine 03/31/2016  . Blood pressure instability 03/31/2016  . Other headache syndrome 02/25/2016  . SOB (shortness of breath) 01/25/2016  . Anemia 01/25/2016  . Vaginal lesion [id number]  . Moderate episode of recurrent major depressive disorder ([place]) 12/10/2015  . Female cystocele 10/16/2015  . Urinary urgency 10/16/2015  . History of obstructive sleep apnea 08/20/2015  . Atrial flutter ([place]) 08/20/2015  . 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 Chronic  back pain 08/13/2015  . Recurrent UTI 08/13/2015  . Insomnia 08/13/2015  . Hyperhidrosis 08/13/2015    Past Surgical History:  Procedure Laterality Date  . ABDOMINAL HYSTERECTOMY    . APPENDECTOMY    . BACK SURGERY    . BACK SURGERY     Lumbar rods  . BLADDER SURGERY    . BREAST LUMPECTOMY    . CARPAL TUNNEL RELEASE Bilateral   . CORONARY ARTERY BYPASS GRAFT     Triple  . LYMPECTOMY  1980  . TONSILLECTOMY    . VAGINA SURGERY    . WRIST SURGERY      OB History    No data available       Home Medications    Prior to Admission medications   Medication Sig Start Date End Date Taking? Authorizing Provider  acetaZOLAMIDE (DIAMOX) 125 MG tablet TAKE 1 TABLET(125 MG) BY MOUTH THREE TIMES DAILY 10/24/16  Yes [name], DO  aspirin 81 MG tablet Take 162 mg by mouth daily.    Yes [provider]  carvedilol (COREG) 12.5 MG tablet Take 6 mg by mouth 2 (two) times daily.  02/25/16  Yes [provider]  FLUoxetine (PROZAC) 40 MG capsule Take 1 capsule (40 mg total) by mouth daily. 10/19/16  Yes Alexander, Natalie, DO  RANEXA 500 MG 12 hr tablet TAKE 1 TABLET BY MOUTH TWO  TIMES DAILY 06/14/16  Yes Lelon Perla, MD  ATROVENT HFA 17 MCG/ACT inhaler INHALE  2 PUFFS BY MOUTH EVERY 6 HOURS AS NEEDED FOR WHEEZING 05/17/16   Gregor Hams, MD  estradiol (ESTRACE) 0.1 MG/GM vaginal cream INSERT 1/2 TO 1 APPLICATORFUL VAGINALLY 3 TIMES A WEEK 10/13/16   [name], DO  FLUoxetine (PROZAC) 20 MG capsule Take 1 capsule (20 mg total) by mouth daily. Patient taking differently: Take 10 mg by mouth daily.  05/05/16   [name], DO  gabapentin (NEURONTIN) 600 MG tablet TAKE 1 TABLET( 600 MG TOTAL) BY MOUTH IN THE MORNING AND AFTERNOON. IN EVENING CAN TAKE 2 TABLETS( 1200 MG TOTAL) 10/07/16   [name], DO  lisinopril (PRINIVIL,ZESTRIL) 5 MG tablet Take 0.5 tablets (2.5 mg total) by mouth daily. 05/24/16   [name], DO  lisinopril  (PRINIVIL,ZESTRIL) 5 MG tablet TAKE 1 TABLET(5 MG) BY MOUTH DAILY 09/26/16   [name], DO  LYRICA 75 MG capsule TAKE ONE CAPSULE BY MOUTH DAILY 09/05/16   [name], DO  meclizine (ANTIVERT) 25 MG tablet TAKE 1 TABLET(25 MG) BY MOUTH THREE TIMES DAILY AS NEEDED FOR DIZZINESS 09/02/16   [name], DO  naproxen (NAPROSYN) 375 MG tablet Take 1 tablet (375 mg total) by mouth 2 (two) times daily with a meal. Patient needs to schedule a follow up appointment with PCP before more refills. 09/13/16   [name], DO  nitroGLYCERIN (NITROSTAT) 0.4 MG SL tablet Place 0.4 mg under the tongue every 5 (five) minutes as needed for chest pain.    [provider]  pravastatin (PRAVACHOL) 20 MG tablet Take 1 tablet (20 mg total) by mouth daily. 10/30/15   Lelon Perla, MD  Vitamin D, Ergocalciferol, (DRISDOL) 50000 units CAPS capsule Take 1 capsule (50,000 Units total) by mouth every 7 (seven) days. Take for 8 total doses(weeks) 08/09/16   [name], DO    Family History Family History  Problem Relation Age of Onset  . Cancer Mother   . Hyperlipidemia Father   . Hypertension Father   . Stroke Father   . Heart attack Father   . Hyperlipidemia Maternal Grandmother   . Hypertension Maternal Grandmother   . Stroke Maternal Grandmother   . Hemophilia Maternal Grandfather     Social History Social History  Substance Use Topics  . Smoking status: Former Smoker    Quit date: 2003  . Smokeless tobacco: Never Used  . Alcohol use Yes     Comment: 1 q wk     Allergies   Albuterol; Dilaudid [hydromorphone]; Ivp dye [iodinated diagnostic agents]; Metrizamide; Percocet [oxycodone-acetaminophen]; Sulfa antibiotics; Tape; Levaquin [levofloxacin in d5w]; Codeine; Lanolin; Latex; Percodan [oxycodone-aspirin]; Sulfasalazine; Nickel; Penicillins; and Sulfamethoxazole   Review of Systems Review of Systems  Gastrointestinal: Negative for vomiting.  Neurological:  Positive for headaches.  Psychiatric/Behavioral: The patient has insomnia.   All other systems reviewed and are negative.    Physical Exam Triage Vital Signs ED Triage Vitals  Enc Vitals Group     BP 10/26/16 1832 140/82     Pulse Rate 10/26/16 1832 73     Resp 10/26/16 1832 20     Temp 10/26/16 1832 97.6 F (36.4 C)     Temp Source 10/26/16 1832 Oral     SpO2 10/26/16 1832 96 %     Weight 10/26/16 1833 209 lb (94.8 kg)     Height 10/26/16 1833 5\' 5"  (1.651 m)     Head Circumference --      Peak Flow --      Pain Score 10/26/16 1835  6     Pain Loc --      Pain Edu? --      Excl. in [place]? --    No data found.   Updated Vital Signs BP 140/82 (BP Location: Left Arm)   Pulse 73   Temp 97.6 F (36.4 C) (Oral)   Resp 20   Ht 5\' 5"  (1.651 m)   Wt 209 lb (94.8 kg)   SpO2 96%   BMI 34.78 kg/m   Visual Acuity Right Eye Distance:   Left Eye Distance:   Bilateral Distance:    Right Eye Near:   Left Eye Near:    Bilateral Near:     Physical Exam  Constitutional: She appears well-developed and well-nourished. No distress.  HENT:  Head: Normocephalic and atraumatic.  Right Ear: External ear normal.  Left Ear: External ear normal.  Nose: Nose normal.  Mouth/Throat: Oropharynx is clear and moist.  Eyes: Conjunctivae are normal.  Neck: Neck supple.  Cardiovascular: Normal rate and regular rhythm.   No murmur heard. Pulmonary/Chest: Effort normal and breath sounds normal. No respiratory distress.  Abdominal: Soft. There is no tenderness.  Musculoskeletal: She exhibits no edema.  Neurological: She is alert.  Skin: Skin is warm and dry.  Psychiatric: She has a normal mood and affect.  Nursing note and vitals reviewed.    UC Treatments / Results  Labs (all labs ordered are listed, but only abnormal results are displayed) Labs Reviewed  URINE CULTURE  POCT URINALYSIS DIP (MANUAL ENTRY)    EKG  EKG Interpretation None       Radiology No results  found.  Procedures Procedures (including critical care time)  Medications Ordered in UC Medications - No data to display   Initial Impression / Assessment and Plan / UC Course  I have reviewed the triage vital signs and the nursing notes.  Pertinent labs & imaging results that were available during my care of the patient were reviewed by me and considered in my medical decision making (see chart for details).     Urine is negative,  I will culture.  I will try vistaril for sleep.  I will avoid benzo's/ambien due to pt's age.    Final Clinical Impressions(s) / UC Diagnoses   Final diagnoses:  Dysuria  Insomnia, unspecified type    New Prescriptions New Prescriptions   HYDROXYZINE (ATARAX/VISTARIL) 25 MG TABLET    Take 1 tablet (25 mg total) by mouth at bedtime.     Controlled Substance Prescriptions [place] Controlled Substance Registry consulted? Not Applicable   Fransico Meadow, [place] 10/26/16 1851

## 2016-10-26 NOTE — Discharge Instructions (Signed)
See your Physician for recheck of symptoms.  Urine culture is pending

## 2016-10-27 LAB — URINE CULTURE
MICRO NUMBER:: 81159853
SPECIMEN QUALITY: ADEQUATE

## 2016-10-28 ENCOUNTER — Telehealth: Payer: Self-pay | Admitting: *Deleted

## 2016-10-28 NOTE — Telephone Encounter (Signed)
Spoke to pt given Ucx results. Advised her to f/u with her PCP if she fails to improve. Pt verbalized understanding.

## 2016-11-10 ENCOUNTER — Other Ambulatory Visit: Payer: Self-pay | Admitting: Osteopathic Medicine

## 2016-11-11 DIAGNOSIS — H04123 Dry eye syndrome of bilateral lacrimal glands: Secondary | ICD-10-CM | POA: Diagnosis not present

## 2016-11-11 DIAGNOSIS — D3131 Benign neoplasm of right choroid: Secondary | ICD-10-CM | POA: Diagnosis not present

## 2016-11-23 ENCOUNTER — Ambulatory Visit: Payer: Medicare Other | Admitting: Cardiology

## 2016-11-23 ENCOUNTER — Encounter: Payer: Self-pay | Admitting: Cardiology

## 2016-11-23 VITALS — BP 146/89 | HR 78 | Ht 65.0 in | Wt 213.4 lb

## 2016-11-23 DIAGNOSIS — I10 Essential (primary) hypertension: Secondary | ICD-10-CM | POA: Diagnosis not present

## 2016-11-23 DIAGNOSIS — I25810 Atherosclerosis of coronary artery bypass graft(s) without angina pectoris: Secondary | ICD-10-CM

## 2016-11-23 DIAGNOSIS — E7800 Pure hypercholesterolemia, unspecified: Secondary | ICD-10-CM

## 2016-11-23 DIAGNOSIS — R072 Precordial pain: Secondary | ICD-10-CM

## 2016-11-23 MED ORDER — PRAVASTATIN SODIUM 40 MG PO TABS: 40.0000 mg | ORAL_TABLET | Freq: Every evening | ORAL | 3 refills | Status: DC

## 2016-11-23 MED ORDER — LISINOPRIL 10 MG PO TABS: 10.0000 mg | ORAL_TABLET | Freq: Two times a day (BID) | ORAL | 3 refills | Status: DC

## 2016-11-23 NOTE — Patient Instructions (Signed)
Medication Instructions:   INCREASE LISINOPRIL TO 10 MG TWICE DAILY  START PRAVASTATIN 40 MG ONCE DAILY  Labwork:  Your physician recommends that you return for lab work in: [place]:  Your physician wants you to follow-up in: [place] will receive a reminder letter in the mail two months in advance. If you don't receive a letter, please call our office to schedule the follow-up appointment.   If you need a refill on your cardiac medications before your next appointment, please call your pharmacy.

## 2016-11-23 NOTE — Progress Notes (Signed)
HPI: FU coronary artery disease. CABG 2013; pt had PCI of PDA and PLA following CABG. Cardiac catheterization April 2016 showed severe three-vessel coronary disease, patent LIMA to the LAD, patent saphenous vein graft to the ramus intermedius, patent saphenous vein graft to the PDA with patent stents in the PDA beyond the graft insertion site, normal LV systolic function. Echocardiogram November 2016 showed normal LV function, mild LVH, mild left atrial enlargement and mild right ventricular enlargement. Holter monitor January 2017 showed sinus rhythm with possible atrial flutter, PACs, atrial couplets and atrial triplets. I obtained the strips from [place] and felt that it showed sinus with PAT. Anticoagulation discontinued. Nuclear study November 2017 showed ejection fraction 66% with normal perfusion. Since last seen, patient describes some dyspnea on exertion. She has occasional brief chest pains that have been chronic and unchanged. She did have some palpitations. She describes some dizziness. She also has had problems with migraines.  Current Outpatient Medications  Medication Sig Dispense Refill  . aspirin 81 MG tablet Take 162 mg by mouth daily.     . ATROVENT HFA 17 MCG/ACT inhaler INHALE 2 PUFFS BY MOUTH EVERY 6 HOURS AS NEEDED FOR WHEEZING 12.9 g 0  . carvedilol (COREG) 12.5 MG tablet Take 6 mg by mouth 2 (two) times daily.   0  . estradiol (ESTRACE) 0.1 MG/GM vaginal cream INSERT 1/2 TO 1 APPLICATORFUL VAGINALLY 3 TIMES A WEEK 42.5 g 0  . FLUoxetine (PROZAC) 40 MG capsule Take 1 capsule (40 mg total) by mouth daily. 90 capsule 0  . lisinopril (PRINIVIL,ZESTRIL) 5 MG tablet TAKE 1 TABLET(5 MG) BY MOUTH DAILY 90 tablet 0  . meclizine (ANTIVERT) 25 MG tablet TAKE 1 TABLET(25 MG) BY MOUTH THREE TIMES DAILY AS NEEDED FOR DIZZINESS 30 tablet 0  . nitroGLYCERIN (NITROSTAT) 0.4 MG SL tablet Place 0.4 mg under the tongue every 5 (five) minutes as needed for chest pain.    Marland Kitchen RANEXA 500 MG  12 hr tablet TAKE 1 TABLET BY MOUTH TWO  TIMES DAILY 180 tablet 3  . traZODone (DESYREL) 50 MG tablet Take 50 mg at bedtime by mouth.     No current facility-administered medications for this visit.      Past Medical History:  Diagnosis Date  . Chronic fatigue   . Depression   . Fatigue 08/16/2016  . Fibromyalgia   . Heart attack ([place])   . Hx of CABG   . Hyperlipidemia   . Hypertension   . Migraine     Past Surgical History:  Procedure Laterality Date  . ABDOMINAL HYSTERECTOMY    . APPENDECTOMY    . BACK SURGERY    . BACK SURGERY     Lumbar rods  . BLADDER SURGERY    . BREAST LUMPECTOMY    . CARPAL TUNNEL RELEASE Bilateral   . CORONARY ARTERY BYPASS GRAFT     Triple  . LYMPECTOMY  1980  . TONSILLECTOMY    . VAGINA SURGERY    . WRIST SURGERY      Social History   Socioeconomic History  . Marital status: Married    Spouse name: Not on file  . Number of children: 2  . Years of education: 24  . Highest education level: Not on file  Social Needs  . Financial resource strain: Not on file  . Food insecurity - worry: Not on file  . Food insecurity - inability: Not on file  . Transportation needs - medical: Not on file  .  Transportation needs - non-medical: Not on file  Occupational History  . Occupation: Retired  Tobacco Use  . Smoking status: Former Smoker    Last attempt to quit: 2003    Years since quitting: 15.8  . Smokeless tobacco: Never Used  Substance and Sexual Activity  . Alcohol use: Yes    Comment: 1 q wk  . Drug use: No  . Sexual activity: Not on file    Comment: Married  Other Topics Concern  . Not on file  Social History Narrative   Lives at home w/ her husband   Right-handed   Caffeine: 2 cups per day    Family History  Problem Relation Age of Onset  . Cancer Mother   . Hyperlipidemia Father   . Hypertension Father   . Stroke Father   . Heart attack Father   . Hyperlipidemia Maternal Grandmother   . Hypertension Maternal  Grandmother   . Stroke Maternal Grandmother   . Hemophilia Maternal Grandfather     ROS: no fevers or chills, productive cough, hemoptysis, dysphasia, odynophagia, melena, hematochezia, dysuria, hematuria, rash, seizure activity, orthopnea, PND, pedal edema, claudication. Remaining systems are negative.  Physical Exam: Well-developed obese in no acute distress.  Skin is warm and dry.  HEENT is normal.  Neck is supple.  Chest is clear to auscultation with normal expansion.  Cardiovascular exam is regular rate and rhythm.  Abdominal exam nontender or distended. No masses palpated. Extremities show no edema. neuro grossly intact  ECG- sinus rhythm at a rate of 67 with lateral T-wave inversion. When compared to previous tracings she has had similar T-wave changes in the past. personally reviewed  A/P  1 coronary artery disease status post coronary artery bypass graft-plan to continue aspirin, statin and ranexa.   2 hypertension-blood pressure is elevated; increase lisinopril to 10 mg BID; check K and renal function 1 week.  3 hyperlipidemia-patient has not tolerated high-dose statins previously. We will resume pravastatin 40 mg daily; check lipids and liver in 4 weeks. If LDL greater than 70 we'll refer to lipid clinic for consideration of repatha.  4 history of question atrial flutter at outside facility- I previously reviewed her strips and there appeared to be brief PAT and we have therefore elected not to anticoagulate long-term.  5 chest pain-symptoms are chronic. Difficult to evaluate. Previous nuclear study showed no ischemia and symptoms unchanged. Electrocardiogram shows no ST changes. We will not pursue further ischemia evaluation at this point.  Kirk Ruths, MD

## 2016-11-28 DIAGNOSIS — H04123 Dry eye syndrome of bilateral lacrimal glands: Secondary | ICD-10-CM | POA: Diagnosis not present

## 2016-11-28 DIAGNOSIS — H3561 Retinal hemorrhage, right eye: Secondary | ICD-10-CM | POA: Diagnosis not present

## 2016-11-28 DIAGNOSIS — D3131 Benign neoplasm of right choroid: Secondary | ICD-10-CM | POA: Diagnosis not present

## 2016-11-30 ENCOUNTER — Ambulatory Visit (INDEPENDENT_AMBULATORY_CARE_PROVIDER_SITE_OTHER): Payer: Medicare Other | Admitting: Family Medicine

## 2016-11-30 ENCOUNTER — Encounter: Payer: Self-pay | Admitting: Family Medicine

## 2016-11-30 VITALS — BP 156/85 | HR 75 | Temp 98.2°F | Resp 16 | Ht 65.0 in | Wt 212.0 lb

## 2016-11-30 DIAGNOSIS — M1811 Unilateral primary osteoarthritis of first carpometacarpal joint, right hand: Secondary | ICD-10-CM | POA: Insufficient documentation

## 2016-11-30 DIAGNOSIS — M7062 Trochanteric bursitis, left hip: Secondary | ICD-10-CM | POA: Diagnosis not present

## 2016-11-30 NOTE — Progress Notes (Signed)
Carrie Salazar is a 71 y.o. female who presents to [facility] today for pain in the left hip and right hand.  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  She had injections in these which worked until recently.  She notes the pain is returned and she would like repeat injection if possible.  She denies any repeat injury.  She notes the pain in her hand is worse with hand motion and better with rest.  She is tried over-the-counter medicines which have not helped.  She has pain in her left lateral hip is worse with standing from a seated position and light side.  She is tried over-the-counter medications which have also not helped.   Past Medical History:  Diagnosis Date  . Chronic fatigue   . Depression   . Fatigue 08/16/2016  . Fibromyalgia   . Heart attack ([place])   . Hx of CABG   . Hyperlipidemia   . Hypertension   . Migraine    Past Surgical History:  Procedure Laterality Date  . ABDOMINAL HYSTERECTOMY    . APPENDECTOMY    . BACK SURGERY    . BACK SURGERY     Lumbar rods  . BLADDER SURGERY    . BREAST LUMPECTOMY    . CARPAL TUNNEL RELEASE Bilateral   . CORONARY ARTERY BYPASS GRAFT     Triple  . LYMPECTOMY  1980  . TONSILLECTOMY    . VAGINA SURGERY    . WRIST SURGERY     Social History   Tobacco Use  . Smoking status: Former Smoker    Last attempt to quit: 2003    Years since quitting: 15.8  . Smokeless tobacco: Never Used  Substance Use Topics  . Alcohol use: Yes    Comment: 1 q wk     ROS:  As above   Medications: Current Outpatient Medications  Medication Sig Dispense Refill  . aspirin 81 MG tablet Take 162 mg by mouth daily.     . ATROVENT HFA 17 MCG/ACT inhaler INHALE 2 PUFFS BY MOUTH EVERY 6 HOURS AS NEEDED FOR WHEEZING 12.9 g 0  . carvedilol (COREG) 12.5 MG tablet Take 6 mg by mouth 2 (two) times daily.   0  . estradiol (ESTRACE) 0.1 MG/GM vaginal cream INSERT 1/2  TO 1 APPLICATORFUL VAGINALLY 3 TIMES A WEEK 42.5 g 0  . FLUoxetine (PROZAC) 40 MG capsule Take 1 capsule (40 mg total) by mouth daily. 90 capsule 0  . lisinopril (PRINIVIL,ZESTRIL) 10 MG tablet Take 1 tablet (10 mg total) 2 (two) times daily by mouth. 180 tablet 3  . meclizine (ANTIVERT) 25 MG tablet TAKE 1 TABLET(25 MG) BY MOUTH THREE TIMES DAILY AS NEEDED FOR DIZZINESS 30 tablet 0  . nitroGLYCERIN (NITROSTAT) 0.4 MG SL tablet Place 0.4 mg under the tongue every 5 (five) minutes as needed for chest pain.    . pravastatin (PRAVACHOL) 40 MG tablet Take 1 tablet (40 mg total) every evening by mouth. 90 tablet 3  . RANEXA 500 MG 12 hr tablet TAKE 1 TABLET BY MOUTH TWO  TIMES DAILY 180 tablet 3  . traZODone (DESYREL) 50 MG tablet Take 50 mg at bedtime by mouth.     No current facility-administered medications for this visit.    Allergies  Allergen Reactions  . Albuterol Palpitations and Other (See Comments)    HALLUCINATIONS Also feels "funny in the head"  . Dilaudid [Hydromorphone] Anaphylaxis  Caused patient to have heart attack and crazy in the head  . Ivp Dye [Iodinated Diagnostic Agents] Other (See Comments)    Itching and causes me to pass out  . Metrizamide Other (See Comments), Itching, Rash and Hives    Itching and causes me to pass out  . Percocet [Oxycodone-Acetaminophen] Itching  . Sulfa Antibiotics Hives and Other (See Comments)    Itching and causes me to pass out**PT CAN TAKE BENADRYL 2 HOURS PRIOR AND IS OK**   . Tape Itching  . Levaquin [Levofloxacin In D5w] Other (See Comments)    "I get real dizzy and pass out"  "I get real dizzy and pass out"   . Codeine Itching and Hives  . Lanolin Hives    "eats skin off"  . Latex Other (See Comments)    LEAVES REALLY BAD BURN ON SKIN   . Percodan [Oxycodone-Aspirin]   . Sulfasalazine Hives  . Nickel Rash  . Penicillins Hives  . Sulfamethoxazole Rash and Hives     Exam:  BP (!) 156/85   Pulse 75   Temp 98.2 F (36.8  C) (Oral)   Resp 16   Ht 5\' 5"  (1.651 m)   Wt 212 lb (96.2 kg)   SpO2 96%   BMI 35.28 kg/m  General: Well Developed, well nourished, and in no acute distress.  Neuro/Psych: Alert and oriented x3, extra-ocular muscles intact, able to move all 4 extremities, sensation grossly intact. Skin: Warm and dry, no rashes noted.  Respiratory: Not using accessory muscles, speaking in full sentences, trachea midline.  Cardiovascular: Pulses palpable, no extremity edema. Abdomen: Does not appear distended. MSK:  Right hand bossing tenderness and swelling at the first [facility]  Pain is worse with motion.  Left hip normal-appearing normal motion tender to palpation greater trochanter  Hip greater trochanteric injection: left Consent obtained and timeout performed. Area of maximum tenderness palpated and identified. Skin cleaned with alcohol, cold spray applied. A spinal needle was used to access the greater trochanteric bursa. 80 mg of Depo-Medrol, and 4 mL of Marcaine were used to inject the trochanteric bursa. Patient tolerated the procedure well.  Procedure: Real-time Ultrasound Guided Injection of right 1st MCP  Device: GE Logiq E  Images permanently stored and available for review in the ultrasound unit. Verbal informed consent obtained. Discussed risks and benefits of procedure. Warned about infection bleeding damage to structures skin hypopigmentation and fat atrophy among others. Patient expresses understanding and agreement Time-out conducted.  Noted no overlying erythema, induration, or other signs of local infection.  Skin prepped in a sterile fashion.  Local anesthesia: Topical Ethyl chloride.  With sterile technique and under real time ultrasound guidance: 0.5mg  dexamethasone and 0.71ml lidocaine injected easily.  Completed without difficulty  Pain immediately resolved suggesting accurate placement of the medication.  Advised to call if fevers/chills, erythema, induration,  drainage, or persistent bleeding.  Images permanently stored and available for review in the ultrasound unit.  Impression: Technically successful ultrasound guided injection.     No results found for this or any previous visit (from the past 48 hour(s)). No results found.    Assessment and Plan: 71 y.o. 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  Recheck as needed.  Trochanteric bursitis of the left hip status post injection today.  Plan to proceed with home exercise program as patient cannot afford physical therapy at this time.  We will also recheck as needed.    No orders of the  defined types were placed in this encounter.  No orders of the defined types were placed in this encounter.   Discussed warning signs or symptoms. Please see discharge instructions. Patient expresses understanding.

## 2016-11-30 NOTE — Patient Instructions (Signed)
Thank you for coming in today. Recheck as needed.  Work on the exercises.   Call or go to the ER if you develop a large red swollen joint with extreme pain or oozing puss.

## 2016-12-08 DIAGNOSIS — Z85820 Personal history of malignant melanoma of skin: Secondary | ICD-10-CM | POA: Diagnosis not present

## 2016-12-08 DIAGNOSIS — L720 Epidermal cyst: Secondary | ICD-10-CM | POA: Diagnosis not present

## 2016-12-08 DIAGNOSIS — D229 Melanocytic nevi, unspecified: Secondary | ICD-10-CM | POA: Diagnosis not present

## 2016-12-08 DIAGNOSIS — L821 Other seborrheic keratosis: Secondary | ICD-10-CM | POA: Diagnosis not present

## 2016-12-15 DIAGNOSIS — I25810 Atherosclerosis of coronary artery bypass graft(s) without angina pectoris: Secondary | ICD-10-CM | POA: Diagnosis not present

## 2016-12-15 LAB — LIPID PANEL
CHOLESTEROL: 299 mg/dL — AB (ref <200)
HDL: 47 mg/dL — AB (ref >50)
LDL Cholesterol (Calc): 197 mg/dL (calc) — ABNORMAL HIGH
Non-HDL Cholesterol (Calc): 252 mg/dL (calc) — ABNORMAL HIGH (ref <130)
Total CHOL/HDL Ratio: 6.4 (calc) — ABNORMAL HIGH (ref <5.0)
Triglycerides: 323 mg/dL — ABNORMAL HIGH (ref <150)

## 2016-12-15 LAB — BASIC METABOLIC PANEL
BUN/Creatinine Ratio: 19 (calc) (ref 6–22)
BUN: 20 mg/dL (ref 7–25)
CALCIUM: 9.2 mg/dL (ref 8.6–10.4)
CHLORIDE: 104 mmol/L (ref 98–110)
CO2: 26 mmol/L (ref 20–32)
Creat: 1.06 mg/dL — ABNORMAL HIGH (ref 0.60–0.93)
Glucose, Bld: 92 mg/dL (ref 65–99)
POTASSIUM: 4.7 mmol/L (ref 3.5–5.3)
Sodium: 139 mmol/L (ref 135–146)

## 2016-12-15 LAB — HEPATIC FUNCTION PANEL
AG RATIO: 1.7 (calc) (ref 1.0–2.5)
ALKALINE PHOSPHATASE (APISO): 58 U/L (ref 33–130)
ALT: 17 U/L (ref 6–29)
AST: 15 U/L (ref 10–35)
Albumin: 4.1 g/dL (ref 3.6–5.1)
BILIRUBIN DIRECT: 0 mg/dL (ref 0.0–0.2)
BILIRUBIN INDIRECT: 0.4 mg/dL (ref 0.2–1.2)
BILIRUBIN TOTAL: 0.4 mg/dL (ref 0.2–1.2)
Globulin: 2.4 g/dL (calc) (ref 1.9–3.7)
TOTAL PROTEIN: 6.5 g/dL (ref 6.1–8.1)

## 2016-12-20 ENCOUNTER — Other Ambulatory Visit: Payer: Self-pay | Admitting: Family Medicine

## 2016-12-20 DIAGNOSIS — J209 Acute bronchitis, unspecified: Secondary | ICD-10-CM

## 2016-12-22 ENCOUNTER — Other Ambulatory Visit: Payer: Self-pay | Admitting: Osteopathic Medicine

## 2016-12-22 DIAGNOSIS — J209 Acute bronchitis, unspecified: Secondary | ICD-10-CM

## 2016-12-22 MED ORDER — IPRATROPIUM BROMIDE HFA 17 MCG/ACT IN AERS: 2.0000 | INHALATION_SPRAY | RESPIRATORY_TRACT | 3 refills | Status: DC | PRN

## 2016-12-22 NOTE — Progress Notes (Signed)
Refill

## 2016-12-25 ENCOUNTER — Emergency Department
Admission: EM | Admit: 2016-12-25 | Discharge: 2016-12-25 | Disposition: A | Discharge status: Home / Self Care | Payer: Medicare Other | Source: Home / Self Care | Attending: Family Medicine | Admitting: Family Medicine

## 2016-12-25 ENCOUNTER — Encounter: Payer: Self-pay | Admitting: Emergency Medicine

## 2016-12-25 DIAGNOSIS — L509 Urticaria, unspecified: Secondary | ICD-10-CM

## 2016-12-25 DIAGNOSIS — T7840XA Allergy, unspecified, initial encounter: Secondary | ICD-10-CM

## 2016-12-25 LAB — POCT URINALYSIS DIP (MANUAL ENTRY)
Bilirubin, UA: NEGATIVE
Blood, UA: NEGATIVE
Glucose, UA: NEGATIVE mg/dL
Ketones, POC UA: NEGATIVE mg/dL
Leukocytes, UA: NEGATIVE
Nitrite, UA: NEGATIVE
Protein Ur, POC: NEGATIVE mg/dL
Spec Grav, UA: >=1.03 — AB (ref 1.010–1.025)
Urobilinogen, UA: 0.2 E.U./dL
pH, UA: 5.5 (ref 5.0–8.0)

## 2016-12-25 MED ORDER — PREDNISONE 20 MG PO TABS: ORAL_TABLET | ORAL | 0 refills | Status: DC

## 2016-12-25 MED ORDER — CETIRIZINE HCL 5 MG PO TABS: 5.0000 mg | ORAL_TABLET | Freq: Every day | ORAL | 0 refills | Status: DC

## 2016-12-25 MED ORDER — METHYLPREDNISOLONE SODIUM SUCC 40 MG IJ SOLR
80.0000 mg | Freq: Once | INTRAMUSCULAR | Status: AC
  Administered 2016-12-25: 80 mg via INTRAMUSCULAR

## 2016-12-25 MED ORDER — HYDROXYZINE HCL 25 MG PO TABS: 25.0000 mg | ORAL_TABLET | Freq: Every day | ORAL | 0 refills | Status: DC

## 2016-12-25 NOTE — ED Provider Notes (Signed)
[facility]    CSN: [id number] Arrival date & time: 12/25/16  1536     History   Chief Complaint Chief Complaint  Patient presents with  . Allergic Reaction    HPI Carrie Salazar is a 71 y.o. female.   HPI Carrie Salazar is a 71 y.o. female presenting to UC with c/o diffuse erythematous moderately itchy rash on neck. Chest, abdomen, back and less on her legs.  She reports having urinary urgency and frequency about 7 days ago so she took Nitrofurantoin she had from 1 year ago.  She took it for 4 days.  Rash started 4 days later.  Denies oral swelling or difficulty breathing.  She denies other new soaps, lotions or medications. She has had the same medication in the past w/o reaction. Urinary symptoms have nearly resolved but she still has some vaginal irritation.  She does recall eating at a new restaurant 4 days ago and notes she is allergic to certain food dyes and preservatives so she is unsure if what she ate caused symptoms.  She tried Benadryl yesterday with mild relief but no medication taken today.      Past Medical History:  Diagnosis Date  . Chronic fatigue   . Depression   . Fatigue 08/16/2016  . Fibromyalgia   . Heart attack ([place])   . Hx of CABG   . Hyperlipidemia   . Hypertension   . Migraine     Patient Active Problem List   Diagnosis Date Noted  . Trochanteric bursitis, left hip 11/30/2016  . Degenerative arthritis of thumb, right 11/30/2016  . Fatigue 08/16/2016  . Vitamin D deficiency 08/09/2016  . Neuropathy 05/24/2016  . Elevated serum creatinine 03/31/2016  . Blood pressure instability 03/31/2016  . Other headache syndrome 02/25/2016  . SOB (shortness of breath) 01/25/2016  . Anemia 01/25/2016  . Vaginal lesion 2020-08-215  . Moderate episode of recurrent major depressive disorder ([place]) 12/10/2015  . Female cystocele 10/16/2015  . Urinary urgency 10/16/2015  . History of obstructive sleep apnea 08/20/2015  . Atrial flutter ([place])  08/20/2015  . 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 Chronic back pain 08/13/2015  . Recurrent UTI 08/13/2015  . Insomnia 08/13/2015  . Hyperhidrosis 08/13/2015    Past Surgical History:  Procedure Laterality Date  . ABDOMINAL HYSTERECTOMY    . APPENDECTOMY    . BACK SURGERY    . BACK SURGERY     Lumbar rods  . BLADDER SURGERY    . BREAST LUMPECTOMY    . CARPAL TUNNEL RELEASE Bilateral   . CORONARY ARTERY BYPASS GRAFT     Triple  . LYMPECTOMY  1980  . TONSILLECTOMY    . VAGINA SURGERY    . WRIST SURGERY      OB History    No data available       Home Medications    Prior to Admission medications   Medication Sig Start Date End Date Taking? Authorizing Provider  aspirin 81 MG tablet Take 162 mg by mouth daily.     [provider]  carvedilol (COREG) 12.5 MG tablet Take 6 mg by mouth 2 (two) times daily.  02/25/16   [provider]  cetirizine (ZYRTEC) 5 MG tablet Take 1 tablet (5 mg total) by mouth daily. 12/25/16   Noe Gens, PA-C  estradiol (ESTRACE) 0.1 MG/GM vaginal cream INSERT 1/2 TO 1 APPLICATORFUL VAGINALLY 3 TIMES A WEEK 10/13/16  [name], DO  FLUoxetine (PROZAC) 40 MG capsule Take 1 capsule (40 mg total) by mouth daily. 10/19/16   [name], DO  hydrOXYzine (ATARAX/VISTARIL) 25 MG tablet Take 1 tablet (25 mg total) by mouth at bedtime. 12/25/16   Noe Gens, PA-C  ipratropium (ATROVENT HFA) 17 MCG/ACT inhaler Inhale 2 puffs into the lungs every 4 (four) hours as needed for wheezing. 12/22/16   [name], DO  lisinopril (PRINIVIL,ZESTRIL) 10 MG tablet Take 1 tablet (10 mg total) 2 (two) times daily by mouth. 11/23/16   Lelon Perla, MD  meclizine (ANTIVERT) 25 MG tablet TAKE 1 TABLET(25 MG) BY MOUTH THREE TIMES DAILY AS NEEDED FOR DIZZINESS 09/02/16   [name], DO  nitroGLYCERIN (NITROSTAT) 0.4 MG SL tablet Place 0.4  mg under the tongue every 5 (five) minutes as needed for chest pain.    [provider]  pravastatin (PRAVACHOL) 40 MG tablet Take 1 tablet (40 mg total) every evening by mouth. 11/23/16 02/21/17  Lelon Perla, MD  predniSONE (DELTASONE) 20 MG tablet 3 tabs po day one, then 2 po daily x 4 days 12/25/16   Noe Gens, PA-C  RANEXA 500 MG 12 hr tablet TAKE 1 TABLET BY MOUTH TWO  TIMES DAILY 06/14/16   Lelon Perla, MD  traZODone (DESYREL) 50 MG tablet Take 50 mg at bedtime by mouth.    [provider]    Family History Family History  Problem Relation Age of Onset  . Cancer Mother   . Hyperlipidemia Father   . Hypertension Father   . Stroke Father   . Heart attack Father   . Hyperlipidemia Maternal Grandmother   . Hypertension Maternal Grandmother   . Stroke Maternal Grandmother   . Hemophilia Maternal Grandfather     Social History Social History   Tobacco Use  . Smoking status: Former Smoker    Last attempt to quit: 2003    Years since quitting: 15.9  . Smokeless tobacco: Never Used  Substance Use Topics  . Alcohol use: Yes    Comment: 1 q wk  . Drug use: No     Allergies   Albuterol; Dilaudid [hydromorphone]; Ivp dye [iodinated diagnostic agents]; Metrizamide; Percocet [oxycodone-acetaminophen]; Sulfa antibiotics; Tape; Levaquin [levofloxacin in d5w]; Codeine; Lanolin; Latex; Percodan [oxycodone-aspirin]; Sulfasalazine; Nickel; Penicillins; and Sulfamethoxazole   Review of Systems Review of Systems  Constitutional: Negative for chills and fever.  HENT: Negative for sore throat, trouble swallowing and voice change.   Respiratory: Negative for chest tightness, shortness of breath, wheezing and stridor.   Gastrointestinal: Negative for diarrhea, nausea and vomiting.  Genitourinary: Positive for vaginal pain (irritation). Negative for dysuria, frequency, hematuria, pelvic pain, urgency and vaginal discharge.  Musculoskeletal: Negative for  arthralgias, back pain, joint swelling and myalgias.  Skin: Positive for rash. Negative for wound.     Physical Exam Triage Vital Signs ED Triage Vitals  Enc Vitals Group     BP 12/25/16 1607 (!) 144/77     Pulse Rate 12/25/16 1607 73     Resp --      Temp 12/25/16 1607 98.6 F (37 C)     Temp Source 12/25/16 1607 Oral     SpO2 12/25/16 1607 97 %     Weight 12/25/16 1608 211 lb 12 oz (96 kg)     Height 12/25/16 1608 5\' 5"  (1.651 m)     Head Circumference --      Peak Flow --  Pain Score 12/25/16 1608 0     Pain Loc --      Pain Edu? --      Excl. in [place]? --    No data found.  Updated Vital Signs BP (!) 144/77 (BP Location: Right Arm)   Pulse 73   Temp 98.6 F (37 C) (Oral)   Ht 5\' 5"  (1.651 m)   Wt 211 lb 12 oz (96 kg)   SpO2 97%   BMI 35.24 kg/m   Visual Acuity Right Eye Distance:   Left Eye Distance:   Bilateral Distance:    Right Eye Near:   Left Eye Near:    Bilateral Near:     Physical Exam  Constitutional: She is oriented to person, place, and time. She appears well-developed and well-nourished. No distress.  HENT:  Head: Normocephalic and atraumatic.  Mouth/Throat: Oropharynx is clear and moist.  No oral swelling noted  Eyes: EOM are normal.  Neck: Normal range of motion. Neck supple.  Cardiovascular: Normal rate and regular rhythm.  Pulmonary/Chest: Effort normal and breath sounds normal. No stridor. No respiratory distress. She has no wheezes. She has no rales.  Abdominal: Soft. She exhibits no distension. There is no tenderness.  Musculoskeletal: Normal range of motion.  Neurological: She is alert and oriented to person, place, and time.  Skin: Skin is warm and dry. Rash noted. She is not diaphoretic. There is erythema.  Diffuse erythematous maculopapular rash, worst on Left side of neck. Covers chest, abdomen, back, and scant amount on legs.  Rash does blanch. Non-tender.  Psychiatric: She has a normal mood and affect. Her behavior is  normal.  Nursing note and vitals reviewed.    UC Treatments / Results  Labs (all labs ordered are listed, but only abnormal results are displayed) Labs Reviewed  POCT URINALYSIS DIP (MANUAL ENTRY) - Abnormal; Notable for the following components:      Result Value   Spec Grav, UA >=1.030 (*)    All other components within normal limits    EKG  EKG Interpretation None       Radiology No results found.  Procedures Procedures (including critical care time)  Medications Ordered in UC Medications  methylPREDNISolone sodium succinate (SOLU-MEDROL) 40 mg/mL injection 80 mg (80 mg Intramuscular Given 12/25/16 1630)     Initial Impression / Assessment and Plan / UC Course  I have reviewed the triage vital signs and the nursing notes.  Pertinent labs & imaging results that were available during my care of the patient were reviewed by me and considered in my medical decision making (see chart for details).     UA: normal, reassured pt of normal UA  Rash c/w allergic reaction. Uncertain of causative allergen- old medication, food allergy, or something else.   No evidence of anaphylaxis at this time. Solumedrol given in UC Encouraged to take prescribed medications to help with itch F/u with PCP as previously scheduled for Thursday 12/20 f/u  Discussed symptoms that warrant emergent care in the ED.   Final Clinical Impressions(s) / UC Diagnoses   Final diagnoses:  Allergic reaction, initial encounter  [place]    ED Discharge Orders        Ordered    predniSONE (DELTASONE) 20 MG tablet     12/25/16 1635    hydrOXYzine (ATARAX/VISTARIL) 25 MG tablet  Daily at bedtime     12/25/16 1635    cetirizine (ZYRTEC) 5 MG tablet  Daily     12/25/16 1635  Controlled Substance Prescriptions [place] Controlled Substance Registry consulted? Not Applicable   Tyrell Antonio 12/25/16 1645

## 2016-12-25 NOTE — Discharge Instructions (Signed)
°  You were given a shot of Solumedrol (a steroid) today to help with itching and swelling from a likely allergic reaction.  You have been prescribed 5 days of prednisone, an oral steroid.  You may start this medication tomorrow with breakfast.    Atarax (hydroxizine) is an antihistamine that can be taken to help with itching. This medication can cause drowsiness so do not drive or drink alcohol while taking.

## 2016-12-25 NOTE — ED Triage Notes (Signed)
Patient states that she started with urgency and frequency about 7 days ago, started on Nitrofurantoin that she had on hand, took it for 4 days, been off of it for 4 days, now she has broken out in hives.

## 2016-12-29 ENCOUNTER — Ambulatory Visit: Payer: Medicare Other

## 2017-01-01 ENCOUNTER — Other Ambulatory Visit: Payer: Self-pay | Admitting: Osteopathic Medicine

## 2017-01-01 DIAGNOSIS — G4709 Other insomnia: Secondary | ICD-10-CM

## 2017-01-09 DIAGNOSIS — G43909 Migraine, unspecified, not intractable, without status migrainosus: Secondary | ICD-10-CM | POA: Insufficient documentation

## 2017-01-09 DIAGNOSIS — E785 Hyperlipidemia, unspecified: Secondary | ICD-10-CM | POA: Insufficient documentation

## 2017-01-09 DIAGNOSIS — Z951 Presence of aortocoronary bypass graft: Secondary | ICD-10-CM | POA: Insufficient documentation

## 2017-01-09 DIAGNOSIS — M797 Fibromyalgia: Secondary | ICD-10-CM | POA: Insufficient documentation

## 2017-01-09 DIAGNOSIS — I10 Essential (primary) hypertension: Secondary | ICD-10-CM | POA: Insufficient documentation

## 2017-01-09 DIAGNOSIS — I219 Acute myocardial infarction, unspecified: Secondary | ICD-10-CM | POA: Insufficient documentation

## 2017-01-09 DIAGNOSIS — F418 Other specified anxiety disorders: Secondary | ICD-10-CM | POA: Insufficient documentation

## 2017-01-09 DIAGNOSIS — R5382 Chronic fatigue, unspecified: Secondary | ICD-10-CM | POA: Insufficient documentation

## 2017-01-15 ENCOUNTER — Other Ambulatory Visit: Payer: Self-pay | Admitting: Osteopathic Medicine

## 2017-01-15 DIAGNOSIS — F331 Major depressive disorder, recurrent, moderate: Secondary | ICD-10-CM

## 2017-01-16 ENCOUNTER — Other Ambulatory Visit: Payer: Self-pay | Admitting: Osteopathic Medicine

## 2017-01-16 DIAGNOSIS — F331 Major depressive disorder, recurrent, moderate: Secondary | ICD-10-CM

## 2017-01-20 ENCOUNTER — Ambulatory Visit: Payer: Medicare Other

## 2017-01-31 ENCOUNTER — Ambulatory Visit: Payer: Medicare Other

## 2017-02-01 ENCOUNTER — Ambulatory Visit (INDEPENDENT_AMBULATORY_CARE_PROVIDER_SITE_OTHER): Payer: Medicare Other | Admitting: Osteopathic Medicine

## 2017-02-01 ENCOUNTER — Ambulatory Visit (INDEPENDENT_AMBULATORY_CARE_PROVIDER_SITE_OTHER): Payer: Medicare Other

## 2017-02-01 ENCOUNTER — Encounter: Payer: Self-pay | Admitting: Osteopathic Medicine

## 2017-02-01 VITALS — BP 136/81 | HR 80 | Wt 215.0 lb

## 2017-02-01 DIAGNOSIS — Z862 Personal history of diseases of the blood and blood-forming organs and certain disorders involving the immune mechanism: Secondary | ICD-10-CM | POA: Diagnosis not present

## 2017-02-01 DIAGNOSIS — R002 Palpitations: Secondary | ICD-10-CM

## 2017-02-01 DIAGNOSIS — E559 Vitamin D deficiency, unspecified: Secondary | ICD-10-CM

## 2017-02-01 DIAGNOSIS — R0602 Shortness of breath: Secondary | ICD-10-CM

## 2017-02-01 DIAGNOSIS — E538 Deficiency of other specified B group vitamins: Secondary | ICD-10-CM

## 2017-02-01 DIAGNOSIS — R5382 Chronic fatigue, unspecified: Secondary | ICD-10-CM

## 2017-02-01 DIAGNOSIS — Z1231 Encounter for screening mammogram for malignant neoplasm of breast: Secondary | ICD-10-CM

## 2017-02-01 DIAGNOSIS — Z1239 Encounter for other screening for malignant neoplasm of breast: Secondary | ICD-10-CM

## 2017-02-01 NOTE — Progress Notes (Signed)
HPI: Carrie Salazar is a 72 y.o. female who  has a past medical history of Chronic fatigue, Depression, Fatigue (08/16/2016), Fibromyalgia, Heart attack ([place]), CABG, Hyperlipidemia, Hypertension, and Migraine.  she presents to [facility] today, 02/01/17,  for chief complaint of: fatigue    Exhaustion getting worse, not able to get out of bed or move around much. Low energy. Takes her a lot to do her daily activities. Reports occasional feeling like heart skips a beat but no chest pain. Reports some shortness of breath on exertion but no chest pain/claudication with this. States last time she felt this tired, was found to have several deficiencies including B12, folate, vitamin D. Denies any worsening of depression issues.   Past medical, surgical, social and family history reviewed:  Patient Active Problem List   Diagnosis Date Noted  . Migraine   . Hypertension   . Hyperlipidemia   . Hx of CABG   . Heart attack ([place])   . Fibromyalgia   . Depression   . Chronic fatigue   . Trochanteric bursitis, left hip 11/30/2016  . Degenerative arthritis of thumb, right 11/30/2016  . Fatigue 08/16/2016  . Vitamin D deficiency 08/09/2016  . Neuropathy 05/24/2016  . Elevated serum creatinine 03/31/2016  . Blood pressure instability 03/31/2016  . Other headache syndrome 02/25/2016  . SOB (shortness of breath) 01/25/2016  . Anemia 01/25/2016  . Vaginal lesion 09-25-202017  . Moderate episode of recurrent major depressive disorder ([place]) 12/10/2015  . Female cystocele 10/16/2015  . Urinary urgency 10/16/2015  . History of obstructive sleep apnea 08/20/2015  . Atrial flutter ([place]) 08/20/2015  . 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 Chronic back pain 08/13/2015  . Recurrent UTI 08/13/2015  . Insomnia 08/13/2015  . Hyperhidrosis 08/13/2015    Past Surgical History:  Procedure  Laterality Date  . ABDOMINAL HYSTERECTOMY    . APPENDECTOMY    . BACK SURGERY    . BACK SURGERY     Lumbar rods  . BLADDER SURGERY    . BREAST LUMPECTOMY    . CARPAL TUNNEL RELEASE Bilateral   . CORONARY ARTERY BYPASS GRAFT     Triple  . LYMPECTOMY  1980  . TONSILLECTOMY    . VAGINA SURGERY    . WRIST SURGERY      Social History   Tobacco Use  . Smoking status: Former Smoker    Last attempt to quit: 2003    Years since quitting: 16.0  . Smokeless tobacco: Never Used  Substance Use Topics  . Alcohol use: Yes    Comment: 1 q wk    Family History  Problem Relation Age of Onset  . Cancer Mother   . Hyperlipidemia Father   . Hypertension Father   . Stroke Father   . Heart attack Father   . Hyperlipidemia Maternal Grandmother   . Hypertension Maternal Grandmother   . Stroke Maternal Grandmother   . Hemophilia Maternal Grandfather      Current medication list and allergy/intolerance information reviewed:    Current Outpatient Medications  Medication Sig Dispense Refill  . aspirin 81 MG tablet Take 162 mg by mouth daily.     . carvedilol (COREG) 12.5 MG tablet Take 6 mg by mouth 2 (two) times daily.   0  . cetirizine (ZYRTEC) 5 MG tablet Take 1 tablet (5 mg total) by mouth daily. 30 tablet 0  . estradiol (ESTRACE) 0.1 MG/GM  vaginal cream INSERT 1/2 TO 1 APPLICATORFUL VAGINALLY 3 TIMES A WEEK 42.5 g 0  . FLUoxetine (PROZAC) 40 MG capsule TAKE 1 CAPSULE(40 MG) BY MOUTH DAILY. FOLLOW UP WITH PCP FOR FURTHER REFILLS 90 capsule 0  . hydrOXYzine (ATARAX/VISTARIL) 25 MG tablet Take 1 tablet (25 mg total) by mouth at bedtime. 7 tablet 0  . ipratropium (ATROVENT HFA) 17 MCG/ACT inhaler Inhale 2 puffs into the lungs every 4 (four) hours as needed for wheezing. 12.9 g 3  . lisinopril (PRINIVIL,ZESTRIL) 10 MG tablet Take 1 tablet (10 mg total) 2 (two) times daily by mouth. 180 tablet 3  . meclizine (ANTIVERT) 25 MG tablet TAKE 1 TABLET(25 MG) BY MOUTH THREE TIMES DAILY AS NEEDED FOR  DIZZINESS 30 tablet 0  . nitroGLYCERIN (NITROSTAT) 0.4 MG SL tablet Place 0.4 mg under the tongue every 5 (five) minutes as needed for chest pain.    . pravastatin (PRAVACHOL) 40 MG tablet Take 1 tablet (40 mg total) every evening by mouth. 90 tablet 3  . predniSONE (DELTASONE) 20 MG tablet 3 tabs po day one, then 2 po daily x 4 days 11 tablet 0  . RANEXA 500 MG 12 hr tablet TAKE 1 TABLET BY MOUTH TWO  TIMES DAILY 180 tablet 3  . traZODone (DESYREL) 100 MG tablet TAKE 1/2 TO 1 TABLET(50 TO 100 MG) BY MOUTH AT BEDTIME AS NEEDED FOR SLEEP 30 tablet 0  . traZODone (DESYREL) 50 MG tablet Take 50 mg at bedtime by mouth.     No current facility-administered medications for this visit.     Allergies  Allergen Reactions  . Albuterol Palpitations and Other (See Comments)    HALLUCINATIONS Also feels "funny in the head"  . Dilaudid [Hydromorphone] Anaphylaxis    Caused patient to have heart attack and crazy in the head  . Ivp Dye [Iodinated Diagnostic Agents] Other (See Comments)    Itching and causes me to pass out  . Metrizamide Other (See Comments), Itching, Rash and Hives    Itching and causes me to pass out  . Percocet [Oxycodone-Acetaminophen] Itching  . Sulfa Antibiotics Hives and Other (See Comments)    Itching and causes me to pass out**PT CAN TAKE BENADRYL 2 HOURS PRIOR AND IS OK**   . Tape Itching  . Levaquin [Levofloxacin In D5w] Other (See Comments)    "I get real dizzy and pass out"  "I get real dizzy and pass out"   . Codeine Itching and Hives  . Lanolin Hives    "eats skin off"  . Latex Other (See Comments)    LEAVES REALLY BAD BURN ON SKIN   . Percodan [Oxycodone-Aspirin]   . Sulfasalazine Hives  . Nickel Rash  . Penicillins Hives  . Sulfamethoxazole Rash and Hives      Review of Systems:  Constitutional:  No  fever, no chills, No recent illness, No unintentional weight changes. +significant fatigue.   HEENT: No  headache, no vision change, no hearing change, No  sore throat, No  sinus pressure  Cardiac: No  chest pain, No  pressure, +palpitations, No  Orthopnea  Respiratory:  +shortness of breath. No  Cough  Gastrointestinal: No  abdominal pain, No  nausea, No  vomiting,  No  blood in stool, No  diarrhea, No  constipation   Musculoskeletal: No new myalgia/arthralgia  Endocrine: No cold intolerance,  +heat intolerance. No polyuria/polydipsia/polyphagia   Neurologic: No  weakness, No  dizziness  Psychiatric: No  concerns with depression, No  concerns  with anxiety, No sleep problems, No mood problems  Exam:  BP 136/81 (BP Location: Right Arm)   Pulse 80   Wt 215 lb (97.5 kg)   BMI 35.78 kg/m   Constitutional: VS see above. General Appearance: alert, well-developed, well-nourished, NAD  Eyes: Normal lids and conjunctive, non-icteric sclera  Ears, Nose, Mouth, Throat: MMM, Normal external inspection ears/nares/mouth/lips/gums.  Neck: No masses, trachea midline. No thyroid enlargement. No tenderness/mass appreciated. No lymphadenopathy  Respiratory: Normal respiratory effort. no wheeze, no rhonchi, no rales  Cardiovascular: S1/S2 normal, no murmur, no rub/gallop auscultated. RRR. No lower extremity edema.   Gastrointestinal: Nontender, no masses. No hepatomegaly, no splenomegaly. No hernia appreciated. Bowel sounds normal. Rectal exam deferred.   Musculoskeletal: Gait normal.   Neurological: Normal balance/coordination. No tremor. No cranial nerve deficit on limited exam. Motor and sensation intact and symmetric. Cerebellar reflexes intact.   Skin: warm, dry, intact.    Psychiatric: Normal judgment/insight. Normal mood and affect. Oriented x3.     EKG interpretation: Rate: 81  Rhythm: sinus No ST/T changes concerning for acute ischemia/infarct  Previous EKG no change from 11/2016    ASSESSMENT/PLAN: Labs as below, consider further workup based on results. Consider echocardiogram or PFT for shortness of breath issues. Consider  sleep study. Consider depression/seasonal affective disorder.  Chronic fatigue - Plan: CBC, COMPLETE METABOLIC PANEL WITH GFR, Urinalysis, Routine w reflex microscopic  Palpitations - Plan: CBC, COMPLETE METABOLIC PANEL WITH GFR, TSH, Magnesium, EKG 12-Lead  SOB (shortness of breath) on exertion - Plan: CBC, DG Chest 2 View  Folate deficiency - Plan: Folate  History of anemia - Plan: CBC  B12 deficiency - Plan: Vitamin B12  Vitamin D deficiency - Plan: VITAMIN D 25 Hydroxy (Vit-D Deficiency, Fractures)  Breast cancer screening - Plan: MM DIGITAL SCREENING BILATERAL    Patient Instructions  Plan:  For new or severe fatigue, we'll go ahead and get some blood work to rule out any dangerous or obvious causes such as anemia or major vitamin deficiencies. It's also always worth considering depression/seasonal affective disorder especially at this time of year. We can always consider changing your antidepressant regimen. It's also important to be on a regular exercise program, and lack of good physical conditioning can also certainly contribute to fatigue.   At this point, I'm not sure what may be causing your fatigue, though it doesn't sound like anything dangerous.  Will get some blood work and a chest x-ray and see if this shows [place] anything  Depending on how you're feeling, we may also get you scheduled for an echocardiogram/heart ultrasound and consider following up with [name] sooner than later.  If shortness of breath continues to bother you, we can also do a lung function test here in the office.   If any other symptoms, especially shortness of breath, get acutely worse, please seek medical attention ASAP     Visit summary with medication list and pertinent instructions was printed for patient to review. All questions at time of visit were answered - patient instructed to contact office with any additional concerns. ER/RTC precautions were reviewed with the patient.    Follow-up plan: Return for Review labs and recheck fatigue symptoms in one to 2 weeks..  Note: Total time spent 25 minutes, greater than 50% of the visit was spent face-to-face counseling and coordinating care for the following: The primary encounter diagnosis was Chronic fatigue. Diagnoses of Palpitations, SOB (shortness of breath) on exertion, Folate deficiency, History of anemia, B12 deficiency, Vitamin  D deficiency, and Breast cancer screening were also pertinent to this visit.Marland Kitchen  Please note: voice recognition software was used to produce this document, and typos may escape review. Please contact Dr. Sheppard Coil for any needed clarifications.

## 2017-02-01 NOTE — Patient Instructions (Addendum)
Plan:  For new or severe fatigue, we'll go ahead and get some blood work to rule out any dangerous or obvious causes such as anemia or major vitamin deficiencies. It's also always worth considering depression/seasonal affective disorder especially at this time of year. We can always consider changing your antidepressant regimen. It's also important to be on a regular exercise program, and lack of good physical conditioning can also certainly contribute to fatigue.   At this point, I'm not sure what may be causing your fatigue, though it doesn't sound like anything dangerous.  Will get some blood work and a chest x-ray and see if this shows [place] anything  Depending on how you're feeling, we may also get you scheduled for an echocardiogram/heart ultrasound and consider following up with [name] sooner than later.  If shortness of breath continues to bother you, we can also do a lung function test here in the office.   If any other symptoms, especially shortness of breath, get acutely worse, please seek medical attention ASAP

## 2017-02-02 ENCOUNTER — Ambulatory Visit: Payer: Medicare Other

## 2017-02-02 ENCOUNTER — Other Ambulatory Visit: Payer: Self-pay | Admitting: Osteopathic Medicine

## 2017-02-02 DIAGNOSIS — G4709 Other insomnia: Secondary | ICD-10-CM

## 2017-02-02 LAB — CBC
HEMATOCRIT: 40.1 % (ref 35.0–45.0)
HEMOGLOBIN: 13.8 g/dL (ref 11.7–15.5)
MCH: 31.6 pg (ref 27.0–33.0)
MCHC: 34.4 g/dL (ref 32.0–36.0)
MCV: 91.8 fL (ref 80.0–100.0)
MPV: 9.6 fL (ref 7.5–12.5)
Platelets: 278 10*3/uL (ref 140–400)
RBC: 4.37 10*6/uL (ref 3.80–5.10)
RDW: 13 % (ref 11.0–15.0)
WBC: 7.7 10*3/uL (ref 3.8–10.8)

## 2017-02-02 LAB — URINALYSIS, ROUTINE W REFLEX MICROSCOPIC
BILIRUBIN URINE: NEGATIVE
Glucose, UA: NEGATIVE
HGB URINE DIPSTICK: NEGATIVE
Ketones, ur: NEGATIVE
LEUKOCYTES UA: NEGATIVE
Nitrite: NEGATIVE
PROTEIN: NEGATIVE
Specific Gravity, Urine: 1.029 (ref 1.001–1.03)
pH: <=5 (ref 5.0–8.0)

## 2017-02-02 LAB — VITAMIN B12: Vitamin B-12: 1539 pg/mL — ABNORMAL HIGH (ref 200–1100)

## 2017-02-02 LAB — COMPLETE METABOLIC PANEL WITH GFR
AG Ratio: 1.5 (calc) (ref 1.0–2.5)
ALT: 16 U/L (ref 6–29)
AST: 17 U/L (ref 10–35)
Albumin: 4.3 g/dL (ref 3.6–5.1)
Alkaline phosphatase (APISO): 52 U/L (ref 33–130)
BUN: 15 mg/dL (ref 7–25)
CALCIUM: 9.8 mg/dL (ref 8.6–10.4)
CO2: 27 mmol/L (ref 20–32)
CREATININE: 0.85 mg/dL (ref 0.60–0.93)
Chloride: 103 mmol/L (ref 98–110)
GFR, EST NON AFRICAN AMERICAN: 68 mL/min/{1.73_m2} (ref >=60)
GFR, Est African American: 79 mL/min/{1.73_m2} (ref >=60)
GLUCOSE: 94 mg/dL (ref 65–99)
Globulin: 2.8 g/dL (calc) (ref 1.9–3.7)
Potassium: 4.3 mmol/L (ref 3.5–5.3)
Sodium: 141 mmol/L (ref 135–146)
Total Bilirubin: 0.2 mg/dL (ref 0.2–1.2)
Total Protein: 7.1 g/dL (ref 6.1–8.1)

## 2017-02-02 LAB — VITAMIN D 25 HYDROXY (VIT D DEFICIENCY, FRACTURES): VIT D 25 HYDROXY: 23 ng/mL — AB (ref 30–100)

## 2017-02-02 LAB — MAGNESIUM: MAGNESIUM: 2.1 mg/dL (ref 1.5–2.5)

## 2017-02-02 LAB — TSH: TSH: 1.89 mIU/L (ref 0.40–4.50)

## 2017-02-02 LAB — FOLATE: FOLATE: 16.8 ng/mL

## 2017-02-06 ENCOUNTER — Encounter: Payer: Self-pay | Admitting: Osteopathic Medicine

## 2017-02-06 ENCOUNTER — Ambulatory Visit (INDEPENDENT_AMBULATORY_CARE_PROVIDER_SITE_OTHER): Payer: Medicare Other | Admitting: Osteopathic Medicine

## 2017-02-06 VITALS — BP 140/80 | HR 90 | Temp 97.6°F | Wt 212.0 lb

## 2017-02-06 DIAGNOSIS — R0602 Shortness of breath: Secondary | ICD-10-CM | POA: Diagnosis not present

## 2017-02-06 DIAGNOSIS — R5382 Chronic fatigue, unspecified: Secondary | ICD-10-CM

## 2017-02-06 DIAGNOSIS — I25810 Atherosclerosis of coronary artery bypass graft(s) without angina pectoris: Secondary | ICD-10-CM

## 2017-02-06 DIAGNOSIS — Z1231 Encounter for screening mammogram for malignant neoplasm of breast: Secondary | ICD-10-CM | POA: Diagnosis not present

## 2017-02-06 DIAGNOSIS — R7989 Other specified abnormal findings of blood chemistry: Secondary | ICD-10-CM | POA: Diagnosis not present

## 2017-02-06 DIAGNOSIS — R002 Palpitations: Secondary | ICD-10-CM | POA: Diagnosis not present

## 2017-02-06 MED ORDER — LISINOPRIL 10 MG PO TABS: 15.0000 mg | ORAL_TABLET | Freq: Two times a day (BID) | ORAL | 3 refills | Status: DC

## 2017-02-06 NOTE — Progress Notes (Signed)
HPI: Carrie Salazar is a 72 y.o. female who  has a past medical history of Chronic fatigue, Depression, Fatigue (08/16/2016), Fibromyalgia, Heart attack ([place]), CABG, Hyperlipidemia, Hypertension, and Migraine.  she presents to [facility] today, 02/06/17,  for chief complaint of: fatigue    Exhaustion getting worse, not able to get out of bed or move around much. Low energy. Takes her a lot to do her daily activities. Reports shortness of breath on exertion but no chest pain/claudication with this. States last time she felt this tired, was found to have several deficiencies including B12, folate, vitamin D. Denies any worsening of depression issues.  Following up today for basic labs done last week. Nothing revealing for obvious causes of fatigue. She states she is feeling about the same or possibly worse    Past medical, surgical, social and family history reviewed:  Patient Active Problem List   Diagnosis Date Noted  . Palpitations 02/01/2017  . Folate deficiency 02/01/2017  . History of anemia 02/01/2017  . B12 deficiency 02/01/2017  . Migraine   . Hypertension   . Hyperlipidemia   . Hx of CABG   . Heart attack ([place])   . Fibromyalgia   . Depression   . Chronic fatigue   . Trochanteric bursitis, left hip 11/30/2016  . Degenerative arthritis of thumb, right 11/30/2016  . Fatigue 08/16/2016  . Vitamin D deficiency 08/09/2016  . Neuropathy 05/24/2016  . Elevated serum creatinine 03/31/2016  . Blood pressure instability 03/31/2016  . Other headache syndrome 02/25/2016  . SOB (shortness of breath) on exertion 01/25/2016  . Anemia 01/25/2016  . Vaginal lesion 05/19/202017  . Moderate episode of recurrent major depressive disorder ([place]) 12/10/2015  . Female cystocele 10/16/2015  . Urinary urgency 10/16/2015  . History of obstructive sleep apnea 08/20/2015  . Atrial flutter ([place]) 08/20/2015  . Coronary artery disease involving coronary bypass graft  of native heart with unspecified angina pectoris 08/13/2015  . Chronic back pain 08/13/2015  . Recurrent UTI 08/13/2015  . Insomnia 08/13/2015  . Hyperhidrosis 08/13/2015    Past Surgical History:  Procedure Laterality Date  . ABDOMINAL HYSTERECTOMY    . APPENDECTOMY    . BACK SURGERY    . BACK SURGERY     Lumbar rods  . BLADDER SURGERY    . BREAST LUMPECTOMY    . CARPAL TUNNEL RELEASE Bilateral   . CORONARY ARTERY BYPASS GRAFT     Triple  . LYMPECTOMY  1980  . TONSILLECTOMY    . VAGINA SURGERY    . WRIST SURGERY      Social History   Tobacco Use  . Smoking status: Former Smoker    Last attempt to quit: 2003    Years since quitting: 16.0  . Smokeless tobacco: Never Used  Substance Use Topics  . Alcohol use: Yes    Comment: 1 q wk    Family History  Problem Relation Age of Onset  . Cancer Mother   . Hyperlipidemia Father   . Hypertension Father   . Stroke Father   . Heart attack Father   . Hyperlipidemia Maternal Grandmother   . Hypertension Maternal Grandmother   . Stroke Maternal Grandmother   . Hemophilia Maternal Grandfather      Current medication list and allergy/intolerance information reviewed:    Current Outpatient Medications  Medication Sig Dispense Refill  . aspirin 81 MG tablet Take 162 mg by mouth daily.     . carvedilol (COREG) 12.5 MG  tablet Take 6 mg by mouth 2 (two) times daily.   0  . cetirizine (ZYRTEC) 5 MG tablet Take 1 tablet (5 mg total) by mouth daily. 30 tablet 0  . estradiol (ESTRACE) 0.1 MG/GM vaginal cream INSERT 1/2 TO 1 APPLICATORFUL VAGINALLY 3 TIMES A WEEK 42.5 g 0  . FLUoxetine (PROZAC) 40 MG capsule TAKE 1 CAPSULE(40 MG) BY MOUTH DAILY. FOLLOW UP WITH PCP FOR FURTHER REFILLS 90 capsule 0  . ipratropium (ATROVENT HFA) 17 MCG/ACT inhaler Inhale 2 puffs into the lungs every 4 (four) hours as needed for wheezing. 12.9 g 3  . lisinopril (PRINIVIL,ZESTRIL) 10 MG tablet Take 1.5 tablets (15 mg total) by mouth 2 (two) times daily.  180 tablet 3  . nitroGLYCERIN (NITROSTAT) 0.4 MG SL tablet Place 0.4 mg under the tongue every 5 (five) minutes as needed for chest pain.    . pravastatin (PRAVACHOL) 40 MG tablet Take 1 tablet (40 mg total) every evening by mouth. 90 tablet 3  . RANEXA 500 MG 12 hr tablet TAKE 1 TABLET BY MOUTH TWO  TIMES DAILY 180 tablet 3   No current facility-administered medications for this visit.     Allergies  Allergen Reactions  . Albuterol Palpitations and Other (See Comments)    HALLUCINATIONS Also feels "funny in the head"  . Dilaudid [Hydromorphone] Anaphylaxis    Caused patient to have heart attack and crazy in the head  . Ivp Dye [Iodinated Diagnostic Agents] Other (See Comments)    Itching and causes me to pass out  . Metrizamide Other (See Comments), Itching, Rash and Hives    Itching and causes me to pass out  . Percocet [Oxycodone-Acetaminophen] Itching  . Sulfa Antibiotics Hives and Other (See Comments)    Itching and causes me to pass out**PT CAN TAKE BENADRYL 2 HOURS PRIOR AND IS OK**   . Tape Itching  . Levaquin [Levofloxacin In D5w] Other (See Comments)    "I get real dizzy and pass out"    . Codeine Itching and Hives  . Lanolin Hives    "eats skin off"  . Latex Other (See Comments)    LEAVES REALLY BAD BURN ON SKIN   . Percodan [Oxycodone-Aspirin]   . Sulfasalazine Hives  . Nickel Rash  . Penicillins Hives  . Sulfamethoxazole Rash and Hives      Review of Systems:  Constitutional:  No  fever, no chills, No recent illness, No unintentional weight changes. +significant fatigue.   HEENT: No  headache, no vision change, no hearing change, No sore throat, No  sinus pressure  Cardiac: No  chest pain, No  pressure, +palpitations, No  Orthopnea  Respiratory:  +shortness of breath. No  Cough  Gastrointestinal: No  abdominal pain, No  nausea,  Musculoskeletal: No new myalgia/arthralgia  Endocrine: No cold intolerance,  +heat intolerance. No  polyuria/polydipsia/polyphagia   Neurologic: No  weakness, No  dizziness  Psychiatric: No  concerns with depression, No  concerns with anxiety, No sleep problems, No mood problems  Exam:  BP 140/80   Pulse 90   Temp 97.6 F (36.4 C) (Oral)   Wt 212 lb 0.6 oz (96.2 kg)   BMI 35.29 kg/m   Constitutional: VS see above. General Appearance: alert, well-developed, well-nourished, NAD  Eyes: Normal lids and conjunctive, non-icteric sclera    Neck: No masses, trachea midline.   Respiratory: Normal respiratory effort. no wheeze, no rhonchi, no rales  Cardiovascular: S1/S2 normal, no murmur, no rub/gallop auscultated. RRR. No lower extremity  edema.   Musculoskeletal: Gait normal.   Neurological: Normal balance/coordination. No tremor.   Skin: warm, dry, intact.    Psychiatric: Normal judgment/insight. Normal mood and affect. Oriented x3.    PFT results reviewed from last year - only one adequate effort, so interpreted with that in mind that PFTs were okay   Results for orders placed or performed in visit on 02/06/17 (from the past 24 hour(s))  CK     Status: None   Collection Time: 02/06/17  5:04 PM  Result Value Ref Range   Total CK 60 29 - 143 U/L  Rheumatoid factor     Status: None   Collection Time: 02/06/17  5:04 PM  Result Value Ref Range   Rhuematoid fact SerPl-aCnc <14 <14 IU/mL  Sedimentation rate     Status: None   Collection Time: 02/06/17  5:04 PM  Result Value Ref Range   Sed Rate 14 0 - 30 mm/h  D-Dimer, Quantitative     Status: Abnormal   Collection Time: 02/06/17  5:05 PM  Result Value Ref Range   D-Dimer, Quant 0.59 (H) <0.50 mcg/mL FEU     ASSESSMENT/PLAN: Will obtain echocardiogram and PFT for shortness of breath issues. Consider sleep study. Given cardiac history, will defer to cardio re: stress test but this might not be a bad idea. Consider depression/seasonal affective disorder, Chronic Fatigue Syndrome, Fibromyalgia.   Chronic fatigue - Plan:  CK, High sensitivity CRP, Rheumatoid factor, Sedimentation rate, ANA, ECHOCARDIOGRAM COMPLETE  SOB (shortness of breath) on exertion - Plan: D-Dimer, Quantitative, ECHOCARDIOGRAM COMPLETE, CT Chest W Contrast  Coronary artery disease involving coronary bypass graft of native heart without angina pectoris - Plan: lisinopril (PRINIVIL,ZESTRIL) 10 MG tablet    Patient Instructions   Call Dr Stanford Breed to schedule visit to discuss shortness of breath on exertion   Will get some additional labs  Will schedule lung function test next week   Addendum: Overall unlikely PE but chronic PE would explain fatigue symptoms, shortness of breath. We'll go ahead and get CTA   Visit summary with medication list and pertinent instructions was printed for patient to review. All questions at time of visit were answered - patient instructed to contact office with any additional concerns. ER/RTC precautions were reviewed with the patient.   Follow-up plan: Return for lung function test repeat in office. CTA to eval chest/PE  Note: Total time spent 40 minutes, greater than 50% of the visit was spent face-to-face counseling and coordinating care for the following: The primary encounter diagnosis was Chronic fatigue. Diagnoses of SOB (shortness of breath) on exertion and Coronary artery disease involving coronary bypass graft of native heart without angina pectoris were also pertinent to this visit.Marland Kitchen  Please note: voice recognition software was used to produce this document, and typos may escape review. Please contact Dr. Sheppard Coil for any needed clarifications.

## 2017-02-06 NOTE — Patient Instructions (Addendum)
   Call Dr Stanford Breed to schedule visit to discuss shortness of breath on exertion   Will get some additional labs  Will schedule lung function test next week   Will schedule echocardiogram

## 2017-02-07 ENCOUNTER — Telehealth: Payer: Self-pay | Admitting: Osteopathic Medicine

## 2017-02-07 ENCOUNTER — Encounter: Payer: Self-pay | Admitting: Osteopathic Medicine

## 2017-02-07 DIAGNOSIS — R0602 Shortness of breath: Secondary | ICD-10-CM

## 2017-02-07 LAB — D-DIMER, QUANTITATIVE: D-Dimer, Quant: 0.59 mcg/mL FEU — ABNORMAL HIGH (ref <0.50)

## 2017-02-07 MED ORDER — PREDNISONE 50 MG PO TABS: ORAL_TABLET | ORAL | 0 refills | Status: DC

## 2017-02-07 MED ORDER — DIPHENHYDRAMINE HCL 50 MG PO TABS: 50.0000 mg | ORAL_TABLET | Freq: Once | ORAL | 0 refills | Status: DC

## 2017-02-07 NOTE — Telephone Encounter (Signed)
Order resubmitted via [facility] online. Imaging advised, they will contact Pt to schedule and advise of premeds.

## 2017-02-07 NOTE — Telephone Encounter (Signed)
CT order needs to be changed to the following .Carrie Salazar Pt is also allergic to contrast so she will either need to be premedicated or admitted to the ED for test to be preformed.   Pre meds are: Prednisone PO 13, 7, and 1 hour before imaging. AND 50mg  benadryl 1 hour before imaging.

## 2017-02-07 NOTE — Telephone Encounter (Signed)
Sent to her pharmacy, thanks

## 2017-02-08 ENCOUNTER — Ambulatory Visit (HOSPITAL_BASED_OUTPATIENT_CLINIC_OR_DEPARTMENT_OTHER)
Admission: RE | Admit: 2017-02-08 | Discharge: 2017-02-08 | Disposition: A | Discharge status: Home / Self Care | Payer: Medicare Other | Source: Ambulatory Visit | Attending: Osteopathic Medicine | Admitting: Osteopathic Medicine

## 2017-02-08 DIAGNOSIS — R0602 Shortness of breath: Secondary | ICD-10-CM | POA: Diagnosis not present

## 2017-02-08 DIAGNOSIS — R918 Other nonspecific abnormal finding of lung field: Secondary | ICD-10-CM | POA: Insufficient documentation

## 2017-02-08 DIAGNOSIS — R0600 Dyspnea, unspecified: Secondary | ICD-10-CM | POA: Diagnosis not present

## 2017-02-08 DIAGNOSIS — Z951 Presence of aortocoronary bypass graft: Secondary | ICD-10-CM | POA: Insufficient documentation

## 2017-02-08 DIAGNOSIS — I700 Atherosclerosis of aorta: Secondary | ICD-10-CM | POA: Insufficient documentation

## 2017-02-08 LAB — RHEUMATOID FACTOR: Rhuematoid fact SerPl-aCnc: <14 IU/mL (ref <14)

## 2017-02-08 LAB — ANA: ANA: NEGATIVE

## 2017-02-08 LAB — CK: Total CK: 60 U/L (ref 29–143)

## 2017-02-08 LAB — HIGH SENSITIVITY CRP: hs-CRP: 2.1 mg/L

## 2017-02-08 LAB — SEDIMENTATION RATE: SED RATE: 14 mm/h (ref 0–30)

## 2017-02-08 MED ORDER — IOPAMIDOL (ISOVUE-370) INJECTION 76%
100.0000 mL | Freq: Once | INTRAVENOUS | Status: AC | PRN
  Administered 2017-02-08: 100 mL via INTRAVENOUS

## 2017-02-13 ENCOUNTER — Ambulatory Visit (INDEPENDENT_AMBULATORY_CARE_PROVIDER_SITE_OTHER): Payer: Medicare Other | Admitting: Osteopathic Medicine

## 2017-02-13 ENCOUNTER — Encounter: Payer: Self-pay | Admitting: Osteopathic Medicine

## 2017-02-13 ENCOUNTER — Telehealth: Payer: Self-pay | Admitting: Osteopathic Medicine

## 2017-02-13 VITALS — BP 142/84 | HR 83 | Ht 65.0 in | Wt 213.0 lb

## 2017-02-13 DIAGNOSIS — G4709 Other insomnia: Secondary | ICD-10-CM | POA: Diagnosis not present

## 2017-02-13 DIAGNOSIS — R0602 Shortness of breath: Secondary | ICD-10-CM | POA: Diagnosis not present

## 2017-02-13 MED ORDER — IPRATROPIUM BROMIDE 0.02 % IN SOLN
0.5000 mg | Freq: Once | RESPIRATORY_TRACT | Status: AC
Start: 2017-02-13 — End: 2017-02-13
  Administered 2017-02-13: 0.5 mg via RESPIRATORY_TRACT

## 2017-02-13 NOTE — Progress Notes (Signed)
HPI: Carrie Salazar is a 72 y.o. female who  has a past medical history of Chronic fatigue, Depression, Fatigue (08/16/2016), Fibromyalgia, Heart attack ([place]), CABG, Hyperlipidemia, Hypertension, and Migraine.  she presents to [facility] today, 02/13/17,  for chief complaint of: Fatigue, SOB  Persistent fatigue/shortness of breath. Lab workup so far has been negative. Here today for spirometry, unable to tolerate albuterol also we just did ipratropium but at any rate the pre-treatment spirometry shows no concerns.   Past medical, surgical, social and family history reviewed:  Patient Active Problem List   Diagnosis Date Noted  . Palpitations 02/01/2017  . Folate deficiency 02/01/2017  . History of anemia 02/01/2017  . B12 deficiency 02/01/2017  . Migraine   . Hypertension   . Hyperlipidemia   . Hx of CABG   . Heart attack ([place])   . Fibromyalgia   . Depression   . Chronic fatigue   . Trochanteric bursitis, left hip 11/30/2016  . Degenerative arthritis of thumb, right 11/30/2016  . Fatigue 08/16/2016  . Vitamin D deficiency 08/09/2016  . Neuropathy 05/24/2016  . Elevated serum creatinine 03/31/2016  . Blood pressure instability 03/31/2016  . Other headache syndrome 02/25/2016  . SOB (shortness of breath) on exertion 01/25/2016  . Anemia 01/25/2016  . Vaginal lesion 11-29-202017  . Moderate episode of recurrent major depressive disorder ([place]) 12/10/2015  . Female cystocele 10/16/2015  . Urinary urgency 10/16/2015  . History of obstructive sleep apnea 08/20/2015  . Atrial flutter ([place]) 08/20/2015  . 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 Chronic back pain 08/13/2015  . Recurrent UTI 08/13/2015  . Insomnia 08/13/2015  . Hyperhidrosis 08/13/2015    Past Surgical History:  Procedure Laterality Date  . ABDOMINAL HYSTERECTOMY    . APPENDECTOMY    . BACK SURGERY     . BACK SURGERY     Lumbar rods  . BLADDER SURGERY    . BREAST LUMPECTOMY    . CARPAL TUNNEL RELEASE Bilateral   . CORONARY ARTERY BYPASS GRAFT     Triple  . LYMPECTOMY  1980  . TONSILLECTOMY    . VAGINA SURGERY    . WRIST SURGERY      Social History   Tobacco Use  . Smoking status: Former Smoker    Last attempt to quit: 2003    Years since quitting: 16.1  . Smokeless tobacco: Never Used  Substance Use Topics  . Alcohol use: Yes    Comment: 1 q wk    Family History  Problem Relation Age of Onset  . Cancer Mother   . Hyperlipidemia Father   . Hypertension Father   . Stroke Father   . Heart attack Father   . Hyperlipidemia Maternal Grandmother   . Hypertension Maternal Grandmother   . Stroke Maternal Grandmother   . Hemophilia Maternal Grandfather      Current medication list and allergy/intolerance information reviewed:    Current Outpatient Medications  Medication Sig Dispense Refill  . aspirin 81 MG tablet Take 162 mg by mouth daily.     . carvedilol (COREG) 12.5 MG tablet Take 6 mg by mouth 2 (two) times daily.   0  . cetirizine (ZYRTEC) 5 MG tablet Take 1 tablet (5 mg total) by mouth daily. 30 tablet 0  . estradiol (ESTRACE) 0.1 MG/GM vaginal cream INSERT 1/2 TO 1 APPLICATORFUL VAGINALLY 3 TIMES A WEEK 42.5 g 0  . FLUoxetine (PROZAC)  40 MG capsule TAKE 1 CAPSULE(40 MG) BY MOUTH DAILY. FOLLOW UP WITH PCP FOR FURTHER REFILLS 90 capsule 0  . ipratropium (ATROVENT HFA) 17 MCG/ACT inhaler Inhale 2 puffs into the lungs every 4 (four) hours as needed for wheezing. 12.9 g 3  . lisinopril (PRINIVIL,ZESTRIL) 10 MG tablet Take 1.5 tablets (15 mg total) by mouth 2 (two) times daily. 180 tablet 3  . nitroGLYCERIN (NITROSTAT) 0.4 MG SL tablet Place 0.4 mg under the tongue every 5 (five) minutes as needed for chest pain.    . pravastatin (PRAVACHOL) 40 MG tablet Take 1 tablet (40 mg total) every evening by mouth. 90 tablet 3  . predniSONE (DELTASONE) 50 MG tablet 50 mg oral  prednisone administered orally 13, 7, and 1 hour prior to contrast administration 3 tablet 0  . RANEXA 500 MG 12 hr tablet TAKE 1 TABLET BY MOUTH TWO  TIMES DAILY 180 tablet 3  . diphenhydrAMINE (BENADRYL) 50 MG tablet Take 1 tablet (50 mg total) by mouth once for 1 dose. 1 hour prior to contrast administration 1 tablet 0   No current facility-administered medications for this visit.     Allergies  Allergen Reactions  . Albuterol Palpitations and Other (See Comments)    HALLUCINATIONS Also feels "funny in the head"  . Dilaudid [Hydromorphone] Anaphylaxis    Caused patient to have heart attack and crazy in the head  . Ivp Dye [Iodinated Diagnostic Agents] Other (See Comments)    Itching and causes me to pass out  . Metrizamide Other (See Comments), Itching, Rash and Hives    Itching and causes me to pass out  . Percocet [Oxycodone-Acetaminophen] Itching  . Sulfa Antibiotics Hives and Other (See Comments)    Itching and causes me to pass out**PT CAN TAKE BENADRYL 2 HOURS PRIOR AND IS OK**   . Tape Itching  . Levaquin [Levofloxacin In D5w] Other (See Comments)    "I get real dizzy and pass out"    . Codeine Itching and Hives  . Lanolin Hives    "eats skin off"  . Latex Other (See Comments)    LEAVES REALLY BAD BURN ON SKIN   . Percodan [Oxycodone-Aspirin]   . Sulfasalazine Hives  . Nickel Rash  . Penicillins Hives  . Sulfamethoxazole Rash and Hives      Review of Systems:  Constitutional:  No  fever, no chills  HEENT: No  headache  Cardiac: No  chest pain, No  pressure, No palpitations,   Respiratory:  +shortness of breath. +Cough  Gastrointestinal: No  abdominal pain, No  nausea  Musculoskeletal: No new myalgia/arthralgia  Skin: No  Rash   Exam:  BP (!) 142/84   Pulse 83   Ht 5\' 5"  (1.651 m)   Wt 213 lb (96.6 kg)   SpO2 98%   BMI 35.45 kg/m   Constitutional: VS see above. General Appearance: alert, well-developed, well-nourished, NAD  Eyes: Normal lids  and conjunctive, non-icteric sclera  Ears, Nose, Mouth, Throat: MMM, Normal external inspection ears/nares/mouth/lips/gums.   Neck: No masses, trachea midline. No thyroid enlargement.  Respiratory: Normal respiratory effort. no wheeze, no rhonchi, no rales  Cardiovascular: S1/S2 normal, no murmur, no rub/gallop auscultated. .   Neurological: Normal balance/coordination. No tremor.  Skin: warm, intact.  Psychiatric: Normal judgment/insight. Normal mood and affect. Oriented x3.     PFT INTERPRETATION  FEV1/FVC >70 *AND*  FEV1 >80% predicted  = NORMAL SPIROMETRY NORMAL? Yes  See scanned documents     ASSESSMENT/PLAN:  SOB (shortness of breath) - Plan: ipratropium (ATROVENT) nebulizer solution 0.5 mg, PR EVAL OF BRONCHOSPASM  Other insomnia - Plan: traZODone (DESYREL) 100 MG tablet    Patient Instructions  Plan:  Will get Echocardiogram - will see if we can move this up  Will schedule follow-up with Dr Stanford Breed to evaluate heart / consider stress test     Visit summary with medication list and pertinent instructions was printed for patient to review. All questions at time of visit were answered - patient instructed to contact office with any additional concerns. ER/RTC precautions were reviewed with the patient.   Follow-up plan: Return for recheck as needed if worse/change, otherwise follow up after seeing Dr Stanford Breed .  Note: Total time spent 25 minutes, greater than 50% of the visit was spent face-to-face counseling and coordinating care for the following: The primary encounter diagnosis was SOB (shortness of breath). A diagnosis of Other insomnia was also pertinent to this visit.Marland Kitchen  Please note: voice recognition software was used to produce this document, and typos may escape review. Please contact Dr. Sheppard Coil for any needed clarifications.

## 2017-02-13 NOTE — Telephone Encounter (Signed)
-----   Message from [name], DO sent at 02/13/2017  7:16 AM EST ----- Patient is on my schedule today for follow-up, she needs to be on the nurse schedule as well for pulmonary function testing prior to her visit with me. We please call her and see if she can come in early and get on the nurse schedule?

## 2017-02-13 NOTE — Telephone Encounter (Signed)
Holding the nurse visit 4 pm appointment.

## 2017-02-13 NOTE — Patient Instructions (Addendum)
Plan:  Will get Echocardiogram - will see if we can move this up  Will schedule follow-up with Dr Stanford Breed to evaluate heart / consider stress test

## 2017-02-13 NOTE — Telephone Encounter (Signed)
Left VM for Pt to see if we can get her in here early to do PFT's.

## 2017-02-14 ENCOUNTER — Telehealth: Payer: Self-pay | Admitting: Osteopathic Medicine

## 2017-02-14 ENCOUNTER — Encounter: Payer: Self-pay | Admitting: Osteopathic Medicine

## 2017-02-14 MED ORDER — TRAZODONE HCL 100 MG PO TABS: 100.0000 mg | ORAL_TABLET | Freq: Every evening | ORAL | 0 refills | Status: DC | PRN

## 2017-02-14 NOTE — Telephone Encounter (Signed)
-----   Message from [name], DO sent at 02/14/2017 10:07 AM EST ----- Regarding: needs appts w/ cardiology, appt for echocarduigram  Can we please call Dr. Jacalyn Lefevre office and see if we can get the patient in for an appointment: Shortness of breath with negative pulmonary workup and need to consider possible stress test.   She is also scheduled for an echocardiogram in a few weeks at [facility], can we call them and see if we are able to move this up to a sooner date?

## 2017-02-14 NOTE — Telephone Encounter (Signed)
Information given to referral coordinator to see if we can get these appointments moved.

## 2017-02-16 ENCOUNTER — Other Ambulatory Visit: Payer: Medicare Other

## 2017-02-28 ENCOUNTER — Ambulatory Visit (HOSPITAL_BASED_OUTPATIENT_CLINIC_OR_DEPARTMENT_OTHER)
Admission: RE | Admit: 2017-02-28 | Discharge: 2017-02-28 | Disposition: A | Discharge status: Home / Self Care | Payer: Medicare Other | Source: Ambulatory Visit | Attending: Osteopathic Medicine | Admitting: Osteopathic Medicine

## 2017-02-28 DIAGNOSIS — R5382 Chronic fatigue, unspecified: Secondary | ICD-10-CM | POA: Diagnosis not present

## 2017-02-28 DIAGNOSIS — R0602 Shortness of breath: Secondary | ICD-10-CM | POA: Diagnosis not present

## 2017-02-28 DIAGNOSIS — I110 Hypertensive heart disease with heart failure: Secondary | ICD-10-CM | POA: Insufficient documentation

## 2017-02-28 DIAGNOSIS — Z951 Presence of aortocoronary bypass graft: Secondary | ICD-10-CM | POA: Diagnosis not present

## 2017-02-28 DIAGNOSIS — I509 Heart failure, unspecified: Secondary | ICD-10-CM | POA: Diagnosis not present

## 2017-02-28 DIAGNOSIS — I252 Old myocardial infarction: Secondary | ICD-10-CM | POA: Insufficient documentation

## 2017-02-28 DIAGNOSIS — E785 Hyperlipidemia, unspecified: Secondary | ICD-10-CM | POA: Insufficient documentation

## 2017-02-28 NOTE — Progress Notes (Signed)
Echocardiogram 2D Echocardiogram has been performed.  Joelene Millin 02/28/2017, 3:51 PM

## 2017-03-03 DIAGNOSIS — L219 Seborrheic dermatitis, unspecified: Secondary | ICD-10-CM | POA: Diagnosis not present

## 2017-03-03 DIAGNOSIS — L659 Nonscarring hair loss, unspecified: Secondary | ICD-10-CM | POA: Diagnosis not present

## 2017-03-15 ENCOUNTER — Telehealth: Payer: Self-pay | Admitting: Cardiology

## 2017-03-15 NOTE — Telephone Encounter (Signed)
Left message to call back  

## 2017-03-15 NOTE — Telephone Encounter (Signed)
Spoke to patient, patient reports continued SOB with exertion and palpitations since seeing her PCP on 2/4.  States she had echo by PCP and was informed she may need a stress test.  States she has also started experiencing chest pain for 2-3 days.    Not associated with exertion, no radiation, no N/V/D.        OV note with PCP 2/4-noted she needs to see [name]  Echo 2/19-no decrease in EF-advised to see [name].     1st avaliable appt made for Monday 3/11 with Arnold Long DNP  Patient aware and verbalized understanding.    Routed to MD to make aware.

## 2017-03-15 NOTE — Telephone Encounter (Signed)
New Message   Pt c/o BP issue: STAT if pt c/o blurred vision, one-sided weakness or slurred speech  1. What are your last 5 BP readings? Says its in Dr. Redgie Grayer notes in epic  2. Are you having any other symptoms (ex. Dizziness, headache, blurred vision, passed out)? Dizziness when she walks around  3. What is your BP issue? Heart beats really fast when she stands up and bp goes high when she's walking around

## 2017-03-16 ENCOUNTER — Other Ambulatory Visit: Payer: Self-pay | Admitting: *Deleted

## 2017-03-16 DIAGNOSIS — I25810 Atherosclerosis of coronary artery bypass graft(s) without angina pectoris: Secondary | ICD-10-CM

## 2017-03-16 MED ORDER — PRAVASTATIN SODIUM 40 MG PO TABS: 40.0000 mg | ORAL_TABLET | Freq: Every evening | ORAL | 1 refills | Status: DC

## 2017-03-16 NOTE — Progress Notes (Signed)
Cardiology Office Note   Date:  03/20/2017   ID:  Carrie Salazar, DOB [birth year], MRN [id number]  PCP:  Carrie Reeve, DO  Cardiologist:  Dr. Stanford Salazar  Chief Complaint  Patient presents with  . Shortness of Breath    chest pain on exertion  . Chest Pain     History of Present Illness: Carrie Salazar is a 72 y.o. female who presents for ongoing assessment and management of coronary artery disease, history of CABG in 2013, PCI PDA and PLA following CABG. Most recent cardiac catheterization 2016 reveals patent LIMA to LAD, patent saphenous vein graft to the ramus, patent saphenous vein graft to the PDA with patent stents in the PDA beyond the graft insertion site with normal LV function.  Repeat nuclear medicine study November 2017 revealing normal perfusion. Patient had been in the hospital in [place] just prior to that due to chest discomfort, records received from that hospital revealed sinus rhythm with PAT. She was taken off of anticoagulation at that time. When last seen by Dr. Stanford Salazar on 11/13/2016 the patient continued have occasional palpitations, and also problems with migraines. She did have some associated dizziness.  On that recent visit, the patient's lisinopril was increased to 10 mg twice a day, with follow-up BMET or renal function and potassium status. She was resumed on pravastatin 40 mg daily with follow-up lipids and LFTs in 4 weeks. LDL was greater than 70 she would be referred to lipid clinic for consideration of [place]  Labs: Cholesterol 299, HDL 47, LDL 197, non-HDL cholesterol 252, triglycerides 323.  The patient called on 03/15/2017 with complaints of shortness of breath on exertion along with palpitations. She had recently seen her PCP on 02/13/2017  recommended she follow-up with cardiology.  She comes today complaining of feeling hot, diaphoretic, states that her thyroid is being checked by primary care as her hair is falling out and she is tired.  She  complained of left arm pain and left leg pain which awakened her at night.  She denies any dyspnea on exertion but she is easily fatigued and has no stamina. Past Medical History:  Diagnosis Date  . Chronic fatigue   . Depression   . Fatigue 08/16/2016  . Fibromyalgia   . Heart attack ([place])   . Hx of CABG   . Hyperlipidemia   . Hypertension   . Migraine     Past Surgical History:  Procedure Laterality Date  . ABDOMINAL HYSTERECTOMY    . APPENDECTOMY    . BACK SURGERY    . BACK SURGERY     Lumbar rods  . BLADDER SURGERY    . BREAST LUMPECTOMY    . CARPAL TUNNEL RELEASE Bilateral   . CORONARY ARTERY BYPASS GRAFT     Triple  . LYMPECTOMY  1980  . TONSILLECTOMY    . VAGINA SURGERY    . WRIST SURGERY       Current Outpatient Medications  Medication Sig Dispense Refill  . aspirin 81 MG tablet Take 162 mg by mouth daily.     . carvedilol (COREG) 12.5 MG tablet Take 6 mg by mouth 2 (two) times daily.   0  . cetirizine (ZYRTEC) 5 MG tablet Take 1 tablet (5 mg total) by mouth daily. 30 tablet 0  . Cholecalciferol (VITAMIN D3) 5000 units CAPS Take 1 capsule by mouth daily.    Marland Kitchen estradiol (ESTRACE) 0.1 MG/GM vaginal cream INSERT 1/2 TO 1 APPLICATORFUL VAGINALLY 3 TIMES A WEEK 42.5 g 0  .  FLUoxetine (PROZAC) 40 MG capsule TAKE 1 CAPSULE(40 MG) BY MOUTH DAILY. FOLLOW UP WITH PCP FOR FURTHER REFILLS 90 capsule 0  . ipratropium (ATROVENT HFA) 17 MCG/ACT inhaler Inhale 2 puffs into the lungs every 4 (four) hours as needed for wheezing. 12.9 g 3  . lisinopril (PRINIVIL,ZESTRIL) 10 MG tablet Take 1.5 tablets (15 mg total) by mouth 2 (two) times daily. 180 tablet 3  . nitroGLYCERIN (NITROSTAT) 0.4 MG SL tablet Place 0.4 mg under the tongue every 5 (five) minutes as needed for chest pain.    . pravastatin (PRAVACHOL) 40 MG tablet Take 1 tablet (40 mg total) by mouth every evening. 90 tablet 1  . predniSONE (DELTASONE) 50 MG tablet 50 mg oral prednisone administered orally 13, 7, and 1 hour  prior to contrast administration 3 tablet 0  . RANEXA 500 MG 12 hr tablet TAKE 1 TABLET BY MOUTH TWO  TIMES DAILY 180 tablet 3  . traZODone (DESYREL) 100 MG tablet Take 1-2 tablets (100-200 mg total) by mouth at bedtime as needed for sleep. 30 tablet 0  . diphenhydrAMINE (BENADRYL) 50 MG tablet Take 1 tablet (50 mg total) by mouth once for 1 dose. 1 hour prior to contrast administration 1 tablet 0   No current facility-administered medications for this visit.     Allergies:   Albuterol; Dilaudid [hydromorphone]; Ivp dye [iodinated diagnostic agents]; Metrizamide; Percocet [oxycodone-acetaminophen]; Sulfa antibiotics; Tape; Levaquin [levofloxacin in d5w]; Codeine; Lanolin; Latex; Percodan [oxycodone-aspirin]; Sulfasalazine; Nickel; Penicillins; and Sulfamethoxazole    Social History:  The patient  reports that she quit smoking about 16 years ago. she has never used smokeless tobacco. She reports that she drinks alcohol. She reports that she does not use drugs.   Family History:  The patient's family history includes Cancer in her mother; Heart attack in her father; Hemophilia in her maternal grandfather; Hyperlipidemia in her father and maternal grandmother; Hypertension in her father and maternal grandmother; Stroke in her father and maternal grandmother.    ROS: All other systems are reviewed and negative. Unless otherwise mentioned in H&P    PHYSICAL EXAM: VS:  BP 140/76   Pulse 100   Ht 5\' 5"  (1.651 m)   Wt 211 lb 12.8 oz (96.1 kg)   BMI 35.25 kg/m  , BMI Body mass index is 35.25 kg/m. GEN: Well nourished, well developed, in no acute distress obese, fanning herself with a magazine. HEENT: normal  Neck: no JVD, carotid bruits, or masses Cardiac:RRR; no murmurs, rubs, or gallops,no edema  Respiratory:  clear to auscultation bilaterally, normal work of breathing GI: soft, nontender, nondistended, + BS MS: no deformity or atrophy  Skin: warm and dry, no rash Neuro:  Strength and  sensation are intact Psych: euthymic mood, full affect   EKG: Sinus tachycardia with T wave inversion noted laterally.  Unchanged from prior EKG. Recent Labs: 02/01/2017: ALT 16; BUN 15; Creat 0.85; Hemoglobin 13.8; Magnesium 2.1; Platelets 278; Potassium 4.3; Sodium 141; TSH 1.89    Lipid Panel    Component Value Date/Time   CHOL 299 (H) 12/15/2016 1056   TRIG 323 (H) 12/15/2016 1056   HDL 47 (L) 12/15/2016 1056   CHOLHDL 6.4 (H) 12/15/2016 1056      Wt Readings from Last 3 Encounters:  03/20/17 211 lb 12.8 oz (96.1 kg)  02/13/17 213 lb (96.6 kg)  02/06/17 212 lb 0.6 oz (96.2 kg)      Other studies Reviewed: Echocardiogram 09-Mar-2017:  Study Conclusions  - Left ventricle: The cavity  size was normal. Systolic function was   normal. The estimated ejection fraction was in the range of 60%   to 65%. Wall motion was normal; there were no regional wall   motion abnormalities. - Aortic valve: Valve area (VTI): 3.52 cm^2. Valve area (Vmax): 3.6   cm^2. Valve area (Vmean): 3.38 cm^2.  Impressions:  - 1. Normal left ventricular systolic function, visually estimated   ejection fraction is 60-65%. Impaired left ventricular   relaxation. The study is otherwise unremarkable.    ASSESSMENT AND PLAN:  1.  Coronary artery disease: Known history of coronary bypass grafting in 2013, with subsequent repeat catheterization 2016 revealing patent LIMA to LAD, patent saphenous vein graft to the ramus, patent saphenous vein graft to the PDA with patent stents in the PDA beyond the graft insertion site with normal LV function.  I am not certain that she needs to repeat her cardiac cath although she discusses new symptoms of left arm pain left leg pain and worsening fatigue.  I have discussed this with Dr. Martinique, who is DOD at [facility] today.  Knowing that she has a pending TSH she would like to wait on the result before moving forward with cardiac catheterization.  We will continue  current medication regimen.  She knows if symptoms worsen she should report to the hospital.  2.  Hypothyroidism: She is currently not on any thyroid replacement medication.  But she is symptomatic.  We will plan to wait on results from TSH, and thyroid panel prior to proceeding with catheterization.  This is been explained to her and she verbalizes understanding.  3.  Hypertension: Blood pressure is well controlled today.  No changes in her medication regimen.    Current medicines are reviewed at length with the patient today.    Labs/ tests ordered today include: none Jameria Bradway. [place] Pugh, ANP, AACC   03/20/2017 11:25 AM    [facility] [address] [place], [place] [address] Phone: [phone]; Fax: [phone]

## 2017-03-20 ENCOUNTER — Encounter: Payer: Self-pay | Admitting: Adult Health

## 2017-03-20 ENCOUNTER — Ambulatory Visit: Payer: Medicare Other | Admitting: Adult Health

## 2017-03-20 VITALS — BP 140/76 | HR 100 | Ht 65.0 in | Wt 211.8 lb

## 2017-03-20 DIAGNOSIS — I10 Essential (primary) hypertension: Secondary | ICD-10-CM

## 2017-03-20 DIAGNOSIS — I25709 Atherosclerosis of coronary artery bypass graft(s), unspecified, with unspecified angina pectoris: Secondary | ICD-10-CM | POA: Diagnosis not present

## 2017-03-20 DIAGNOSIS — R5382 Chronic fatigue, unspecified: Secondary | ICD-10-CM | POA: Diagnosis not present

## 2017-03-20 DIAGNOSIS — I4892 Unspecified atrial flutter: Secondary | ICD-10-CM | POA: Diagnosis not present

## 2017-03-20 NOTE — Patient Instructions (Signed)
Medication Instructions:  NO CHANGES- Your physician recommends that you continue on your current medications as directed. Please refer to the Current Medication list given to you today.  If you need a refill on your cardiac medications before your next appointment, please call your pharmacy.  Labwork: FAX THYROID RESULTS TO [phone] ATTN:  Kynsli LAWRENCE ([place]), DNP-OR- Tywanna Seifer.    Special Instructions: REFER FOR REPATHA MONITORING  Follow-Up: Your physician wants you to follow-up in: [place].    Thank you for choosing [facility] at [facility]!!

## 2017-03-24 DIAGNOSIS — L658 Other specified nonscarring hair loss: Secondary | ICD-10-CM | POA: Diagnosis not present

## 2017-03-24 DIAGNOSIS — L659 Nonscarring hair loss, unspecified: Secondary | ICD-10-CM | POA: Diagnosis not present

## 2017-03-24 DIAGNOSIS — Z5181 Encounter for therapeutic drug level monitoring: Secondary | ICD-10-CM | POA: Diagnosis not present

## 2017-03-30 ENCOUNTER — Ambulatory Visit (INDEPENDENT_AMBULATORY_CARE_PROVIDER_SITE_OTHER): Payer: Medicare Other

## 2017-03-30 DIAGNOSIS — Z1231 Encounter for screening mammogram for malignant neoplasm of breast: Secondary | ICD-10-CM | POA: Diagnosis not present

## 2017-04-11 ENCOUNTER — Telehealth: Payer: Self-pay | Admitting: Osteopathic Medicine

## 2017-04-11 DIAGNOSIS — M545 Low back pain: Principal | ICD-10-CM

## 2017-04-11 DIAGNOSIS — G8929 Other chronic pain: Secondary | ICD-10-CM

## 2017-04-11 NOTE — Telephone Encounter (Signed)
Pt called and states that her pain meds for her back and hip that Dr. Sheppard Coil put her on is not working and she is requesting a referral to a Pain Clinic/

## 2017-04-13 ENCOUNTER — Other Ambulatory Visit: Payer: Self-pay | Admitting: Osteopathic Medicine

## 2017-04-13 DIAGNOSIS — F331 Major depressive disorder, recurrent, moderate: Secondary | ICD-10-CM

## 2017-04-13 NOTE — Telephone Encounter (Signed)
Forwarding referral request to covering provider for review.

## 2017-04-13 NOTE — Telephone Encounter (Signed)
Vanicia I'm not covering [place] today, nor was I covering her 2 days ago when [place] took the message.

## 2017-04-13 NOTE — Telephone Encounter (Signed)
Forwarding information for review to correct covering provider of 04/11/17.

## 2017-04-14 NOTE — Telephone Encounter (Signed)
Left a detailed vm msg for pt regarding pain management referral. Call back information provided.

## 2017-04-14 NOTE — Addendum Note (Signed)
Addended by: Gregor Hams on: 04/14/2017 04:24 AM   Modules accepted: Orders

## 2017-04-14 NOTE — Telephone Encounter (Signed)
Referral to pain management placed.

## 2017-04-17 ENCOUNTER — Other Ambulatory Visit: Payer: Self-pay

## 2017-04-17 ENCOUNTER — Other Ambulatory Visit: Payer: Self-pay | Admitting: Osteopathic Medicine

## 2017-04-17 ENCOUNTER — Telehealth: Payer: Self-pay

## 2017-04-17 DIAGNOSIS — F331 Major depressive disorder, recurrent, moderate: Secondary | ICD-10-CM

## 2017-04-17 DIAGNOSIS — G5793 Unspecified mononeuropathy of bilateral lower limbs: Secondary | ICD-10-CM

## 2017-04-17 MED ORDER — GABAPENTIN 600 MG PO TABS: ORAL_TABLET | ORAL | 1 refills | Status: DC

## 2017-04-17 MED ORDER — FLUOXETINE HCL 40 MG PO CAPS: ORAL_CAPSULE | ORAL | 0 refills | Status: DC

## 2017-04-17 NOTE — Telephone Encounter (Signed)
Opened in error

## 2017-04-17 NOTE — Telephone Encounter (Signed)
[facility] pharmacy requesting med refills for gabapentin. Pls advise if refill appropriate. Thanks.

## 2017-04-17 NOTE — Telephone Encounter (Signed)
I had on her chart that she is no longer taking this medication - can we check with patient? Last Rx I have on file can be refilled if patient desires - I pended the order

## 2017-04-18 ENCOUNTER — Encounter: Payer: Self-pay | Admitting: Osteopathic Medicine

## 2017-04-18 ENCOUNTER — Ambulatory Visit (INDEPENDENT_AMBULATORY_CARE_PROVIDER_SITE_OTHER): Payer: Medicare Other | Admitting: Osteopathic Medicine

## 2017-04-18 VITALS — BP 143/72 | HR 76 | Temp 98.4°F | Wt 209.1 lb

## 2017-04-18 DIAGNOSIS — M549 Dorsalgia, unspecified: Secondary | ICD-10-CM | POA: Diagnosis not present

## 2017-04-18 DIAGNOSIS — M545 Low back pain: Secondary | ICD-10-CM

## 2017-04-18 DIAGNOSIS — K1370 Unspecified lesions of oral mucosa: Secondary | ICD-10-CM

## 2017-04-18 DIAGNOSIS — G8929 Other chronic pain: Secondary | ICD-10-CM | POA: Diagnosis not present

## 2017-04-18 MED ORDER — TRAMADOL HCL 50 MG PO TABS: 100.0000 mg | ORAL_TABLET | Freq: Three times a day (TID) | ORAL | 0 refills | Status: AC

## 2017-04-18 NOTE — Progress Notes (Signed)
HPI: Carrie Salazar is a 72 y.o. female who  has a past medical history of Chronic fatigue, Depression, Fatigue (08/16/2016), Fibromyalgia, Heart attack ([place]), CABG, Hyperlipidemia, Hypertension, and Migraine.  she presents to [facility] today, 04/18/17,  for chief complaint of: Back and L hip pain  Implanted device on L hip, fell and hurt wrist and thinks she may have damaged the stimulator. Recently called while I was out of the office, requested pain management referral and this order was placed, patient does not yet have an appointment. She was previously referred to neurosurgery but this specialist opted not to see her, not enough information, see referral notes. States that the stimulator keeps shocking her, she is in a lot of pain in the lower back in with certainly displaced  Patient has been on tramadol in the past as needed, she had some left over and took it this morning,back pain has been severe to the point where she is having difficulty with activities of daily living, walking around the house, making meals, going outside. Tramadol is helping especially for sleep. Taking during the day not as helpful for the pain but she feels better than without it . She also has some concern for a spot on her lip, comes and goes seems to get better and worse. Has tried everything for it over-the-counter, lip bal, vaseline, lotions    Past medical, surgical, social and family history reviewed:  Patient Active Problem List   Diagnosis Date Noted  . Palpitations 02/01/2017  . Folate deficiency 02/01/2017  . History of anemia 02/01/2017  . B12 deficiency 02/01/2017  . Migraine   . Hypertension   . Hyperlipidemia   . Hx of CABG   . Heart attack ([place])   . Fibromyalgia   . Depression   . Chronic fatigue   . Trochanteric bursitis, left hip 11/30/2016  . Degenerative arthritis of thumb, right 11/30/2016  . Fatigue 08/16/2016  . Vitamin D deficiency 08/09/2016   . Neuropathy 05/24/2016  . Elevated serum creatinine 03/31/2016  . Blood pressure instability 03/31/2016  . Other headache syndrome 02/25/2016  . SOB (shortness of breath) on exertion 01/25/2016  . Anemia 01/25/2016  . Vaginal lesion 08/18/2015  . Moderate episode of recurrent major depressive disorder ([place]) 12/10/2015  . Female cystocele 10/16/2015  . Urinary urgency 10/16/2015  . History of obstructive sleep apnea 08/20/2015  . Atrial flutter ([place]) 08/20/2015  . Coronary artery disease involving coronary bypass graft of native heart with angina pectoris ([place]) 08/13/2015  . Chronic back pain 08/13/2015  . Recurrent UTI 08/13/2015  . Insomnia 08/13/2015  . Hyperhidrosis 08/13/2015    Past Surgical History:  Procedure Laterality Date  . ABDOMINAL HYSTERECTOMY    . APPENDECTOMY    . BACK SURGERY    . BACK SURGERY     Lumbar rods  . BLADDER SURGERY    . BREAST BIOPSY Left    x 2  . BREAST EXCISIONAL BIOPSY Left    x 2  . BREAST LUMPECTOMY     left breast all benign (x2)  . CARPAL TUNNEL RELEASE Bilateral   . CORONARY ARTERY BYPASS GRAFT     Triple  . LYMPECTOMY  1980  . TONSILLECTOMY    . VAGINA SURGERY    . WRIST SURGERY      Social History   Tobacco Use  . Smoking status: Former Smoker    Last attempt to quit: 2003    Years since quitting: 16.2  .  Smokeless tobacco: Never Used  Substance Use Topics  . Alcohol use: Yes    Comment: 1 q wk    Family History  Problem Relation Age of Onset  . Cancer Mother   . Hyperlipidemia Father   . Hypertension Father   . Stroke Father   . Heart attack Father   . Hyperlipidemia Maternal Grandmother   . Hypertension Maternal Grandmother   . Stroke Maternal Grandmother   . Hemophilia Maternal Grandfather      Current medication list and allergy/intolerance information reviewed:    Current Outpatient Medications  Medication Sig Dispense Refill  . aspirin 81 MG tablet Take 162 mg by mouth daily.     . carvedilol  (COREG) 12.5 MG tablet Take 6 mg by mouth 2 (two) times daily.   0  . cetirizine (ZYRTEC) 5 MG tablet Take 1 tablet (5 mg total) by mouth daily. 30 tablet 0  . Cholecalciferol (VITAMIN D3) 5000 units CAPS Take 1 capsule by mouth daily.    . diphenhydrAMINE (BENADRYL) 50 MG tablet Take 1 tablet (50 mg total) by mouth once for 1 dose. 1 hour prior to contrast administration 1 tablet 0  . estradiol (ESTRACE) 0.1 MG/GM vaginal cream INSERT 1/2 TO 1 APPLICATORFUL VAGINALLY 3 TIMES A WEEK 42.5 g 0  . FLUoxetine (PROZAC) 40 MG capsule TAKE 1 CAPSULE(40 MG) BY MOUTH DAILY. 90 capsule 0  . gabapentin (NEURONTIN) 600 MG tablet TAKE 1 TABLET( 600 MG TOTAL) BY MOUTH IN THE MORNING AND AFTERNOON. IN EVENING CAN TAKE 2 TABLETS( 1200 MG TOTAL) 120 tablet 1  . ipratropium (ATROVENT HFA) 17 MCG/ACT inhaler Inhale 2 puffs into the lungs every 4 (four) hours as needed for wheezing. 12.9 g 3  . lisinopril (PRINIVIL,ZESTRIL) 10 MG tablet Take 1.5 tablets (15 mg total) by mouth 2 (two) times daily. 180 tablet 3  . nitroGLYCERIN (NITROSTAT) 0.4 MG SL tablet Place 0.4 mg under the tongue every 5 (five) minutes as needed for chest pain.    . pravastatin (PRAVACHOL) 40 MG tablet Take 1 tablet (40 mg total) by mouth every evening. 90 tablet 1  . predniSONE (DELTASONE) 50 MG tablet 50 mg oral prednisone administered orally 13, 7, and 1 hour prior to contrast administration 3 tablet 0  . RANEXA 500 MG 12 hr tablet TAKE 1 TABLET BY MOUTH TWO  TIMES DAILY 180 tablet 3  . traZODone (DESYREL) 100 MG tablet Take 1-2 tablets (100-200 mg total) by mouth at bedtime as needed for sleep. 30 tablet 0   No current facility-administered medications for this visit.     Allergies  Allergen Reactions  . Albuterol Palpitations and Other (See Comments)    HALLUCINATIONS Also feels "funny in the head"  . Dilaudid [Hydromorphone] Anaphylaxis    Caused patient to have heart attack and crazy in the head  . Ivp Dye [Iodinated Diagnostic  Agents] Other (See Comments)    Itching and causes me to pass out  . Metrizamide Other (See Comments), Itching, Rash and Hives    Itching and causes me to pass out  . Percocet [Oxycodone-Acetaminophen] Itching  . Sulfa Antibiotics Hives and Other (See Comments)    Itching and causes me to pass out**PT CAN TAKE BENADRYL 2 HOURS PRIOR AND IS OK**   . Tape Itching  . Levaquin [Levofloxacin In D5w] Other (See Comments)    "I get real dizzy and pass out"    . Codeine Itching and Hives  . Lanolin Hives    "eats  skin off"  . Latex Other (See Comments)    LEAVES REALLY BAD BURN ON SKIN   . Percodan [Oxycodone-Aspirin]   . Sulfasalazine Hives  . Nickel Rash  . Penicillins Hives  . Sulfamethoxazole Rash and Hives      Review of Systems:  Constitutional:  No  fever, no chills, No recent illness  HEENT: No  headache, no vision change  Cardiac: No  chest pain, No  pressure, No palpitations, No  Orthopnea  Respiratory:  No  shortness of breath. No  Cough  Gastrointestinal: No  abdominal pain  Musculoskeletal: No new myalgia/arthralgia  Neurologic: No  weakness, No  dizziness, No  slurred speech/focal weakness/facial droop  Psychiatric: No  concerns with depression, No  concerns with anxiety  Exam:  BP (!) 143/72 (BP Location: Right Arm, Patient Position: Sitting, Cuff Size: Large)   Pulse 76   Temp 98.4 F (36.9 C) (Oral)   Wt 209 lb 1.9 oz (94.9 kg)   BMI 34.80 kg/m   Constitutional: VS see above. General Appearance: alert, well-developed, well-nourished, NAD  Eyes: Normal lids and conjunctive, non-icteric sclera  Ears, Nose, Mouth, Throat: MMM, Normal external inspection ears/nares/mouth/gums. Small not quite ulcerated area onright upper lip, mucosal surface of mouth, doesn't appear consistent with malignancy  Neck: No masses, trachea midline.   Respiratory: Normal respiratory effort. no wheeze, no rhonchi, no rales  Cardiovascular: S1/S2 normal, no murmur, no  rub/gallop auscultated. RRR.   Musculoskeletal: Gait favors left side.   Neurological: Normal balance/coordination. No tremor  Psychiatric: Normal judgment/insight. Normal mood and affect. Oriented x3.      ASSESSMENT/PLAN:   Chronic bilateral low back pain without sciatica - restarting chronic medication treatment with tramadol, hopefully can come down this episode and get her into pain management   Chronic back pain - Pain management referral placed, limited dose tramadol - Plan: traMADol (ULTRAM) 50 MG tablet  Mouth lesion - Plan: Ambulatory referral to ENT    Patient Instructions  [facility] at Phone number, [phone]    Visit summary with medication list and pertinent instructions was printed for patient to review. All questions at time of visit were answered - patient instructed to contact office with any additional concerns. ER/RTC precautions were reviewed with the patient.   Follow-up plan: Return in about 2 weeks (around 05/02/2017) for recheck pain medication, sooner if needed .   Please note: voice recognition software was used to produce this document, and typos may escape review. Please contact Dr. Sheppard Coil for any needed clarifications.

## 2017-04-18 NOTE — Patient Instructions (Signed)
[facility] Phone number, [phone]

## 2017-04-18 NOTE — Telephone Encounter (Signed)
As per pt, she is still taking the medicine at bedtime. Med refill sent to [facility]. Pt has been updated.

## 2017-04-19 ENCOUNTER — Encounter: Payer: Self-pay | Admitting: Osteopathic Medicine

## 2017-04-21 ENCOUNTER — Telehealth: Payer: Self-pay

## 2017-04-21 NOTE — Telephone Encounter (Signed)
Rec'd a call from Dr. Melissa Montane (ENT) - Abigail Butts is requesting clinical/office notes with diagnosis for pt regarding mouth lesions. The office wants to make sure they have the correct supplies available for possible biopsy. Requesting a call back to [phone] from provider.

## 2017-04-25 DIAGNOSIS — K1370 Unspecified lesions of oral mucosa: Secondary | ICD-10-CM | POA: Diagnosis not present

## 2017-04-29 ENCOUNTER — Other Ambulatory Visit: Payer: Self-pay | Admitting: Cardiology

## 2017-05-02 ENCOUNTER — Other Ambulatory Visit: Payer: Self-pay | Admitting: Osteopathic Medicine

## 2017-05-02 ENCOUNTER — Ambulatory Visit: Payer: Medicare Other | Admitting: Osteopathic Medicine

## 2017-05-02 DIAGNOSIS — G4709 Other insomnia: Secondary | ICD-10-CM

## 2017-05-02 DIAGNOSIS — Z0189 Encounter for other specified special examinations: Secondary | ICD-10-CM

## 2017-05-03 ENCOUNTER — Encounter: Payer: Self-pay | Admitting: Osteopathic Medicine

## 2017-05-03 ENCOUNTER — Ambulatory Visit (INDEPENDENT_AMBULATORY_CARE_PROVIDER_SITE_OTHER): Payer: Medicare Other | Admitting: Osteopathic Medicine

## 2017-05-03 VITALS — BP 146/84 | HR 69 | Wt 208.6 lb

## 2017-05-03 DIAGNOSIS — M549 Dorsalgia, unspecified: Secondary | ICD-10-CM

## 2017-05-03 DIAGNOSIS — Z9889 Other specified postprocedural states: Secondary | ICD-10-CM | POA: Diagnosis not present

## 2017-05-03 DIAGNOSIS — M545 Low back pain: Secondary | ICD-10-CM | POA: Diagnosis not present

## 2017-05-03 DIAGNOSIS — G8929 Other chronic pain: Secondary | ICD-10-CM

## 2017-05-03 DIAGNOSIS — Z9689 Presence of other specified functional implants: Secondary | ICD-10-CM

## 2017-05-03 MED ORDER — TRAMADOL HCL 50 MG PO TABS: 100.0000 mg | ORAL_TABLET | Freq: Three times a day (TID) | ORAL | 0 refills | Status: DC

## 2017-05-03 NOTE — Progress Notes (Signed)
HPI: Carrie Salazar is a 72 y.o. female who  has a past medical history of Chronic fatigue, Depression, Fatigue (08/16/2016), Fibromyalgia, Heart attack ([place]), CABG, Hyperlipidemia, Hypertension, and Migraine.  she presents to [facility] today, 05/03/17,  for chief complaint of: Back and L hip pain  Implanted device on L hip, fell awhile back and thinks she may have damaged the stimulator. Recently called while I was out of the office, requested pain management referral and this order was placed, patient does not yet have an appointment. She was previously referred to neurosurgery but this specialist opted not to see her, not enough information, see referral notes.   States that the stimulator keeps shocking her, she is in a lot of pain in the lower back. Pain has been severe to the point where she is having difficulty with activities of daily living, walking around the house, making meals, going outside.   Tramadol is helping especially for sleep. We started Tramadol last visit 2 weeks ago. Pain management is in the process of reviewing her records. She is taking 2 tramadol 3 times per day, consistent with the written prescription.     Past medical, surgical, social and family history reviewed:  Patient Active Problem List   Diagnosis Date Noted  . Palpitations 02/01/2017  . Folate deficiency 02/01/2017  . History of anemia 02/01/2017  . B12 deficiency 02/01/2017  . Migraine   . Hypertension   . Hyperlipidemia   . Hx of CABG   . Heart attack ([place])   . Fibromyalgia   . Depression   . Chronic fatigue   . Trochanteric bursitis, left hip 11/30/2016  . Degenerative arthritis of thumb, right 11/30/2016  . Fatigue 08/16/2016  . Vitamin D deficiency 08/09/2016  . Neuropathy 05/24/2016  . Elevated serum creatinine 03/31/2016  . Blood pressure instability 03/31/2016  . Other headache syndrome 02/25/2016  . SOB (shortness of breath) on exertion  01/25/2016  . Anemia 01/25/2016  . Vaginal lesion 01-29-202017  . Moderate episode of recurrent major depressive disorder ([place]) 12/10/2015  . Female cystocele 10/16/2015  . Urinary urgency 10/16/2015  . History of obstructive sleep apnea 08/20/2015  . Atrial flutter ([place]) 08/20/2015  . Coronary artery disease involving coronary bypass graft of native heart with angina pectoris ([place]) 08/13/2015  . Chronic back pain 08/13/2015  . Recurrent UTI 08/13/2015  . Insomnia 08/13/2015  . Hyperhidrosis 08/13/2015    Past Surgical History:  Procedure Laterality Date  . ABDOMINAL HYSTERECTOMY    . APPENDECTOMY    . BACK SURGERY    . BACK SURGERY     Lumbar rods  . BLADDER SURGERY    . BREAST BIOPSY Left    x 2  . BREAST EXCISIONAL BIOPSY Left    x 2  . BREAST LUMPECTOMY     left breast all benign (x2)  . CARPAL TUNNEL RELEASE Bilateral   . CORONARY ARTERY BYPASS GRAFT     Triple  . LYMPECTOMY  1980  . TONSILLECTOMY    . VAGINA SURGERY    . WRIST SURGERY      Social History   Tobacco Use  . Smoking status: Former Smoker    Last attempt to quit: 2003    Years since quitting: 16.3  . Smokeless tobacco: Never Used  Substance Use Topics  . Alcohol use: Yes    Comment: 1 q wk    Family History  Problem Relation Age of Onset  . Cancer Mother   .  Hyperlipidemia Father   . Hypertension Father   . Stroke Father   . Heart attack Father   . Hyperlipidemia Maternal Grandmother   . Hypertension Maternal Grandmother   . Stroke Maternal Grandmother   . Hemophilia Maternal Grandfather      Current medication list and allergy/intolerance information reviewed:    Current Outpatient Medications  Medication Sig Dispense Refill  . aspirin 81 MG tablet Take 162 mg by mouth daily.     . carvedilol (COREG) 12.5 MG tablet Take 6 mg by mouth 2 (two) times daily.   0  . cetirizine (ZYRTEC) 5 MG tablet Take 1 tablet (5 mg total) by mouth daily. 30 tablet 0  . Cholecalciferol (VITAMIN D3)  5000 units CAPS Take 1 capsule by mouth daily.    Marland Kitchen estradiol (ESTRACE) 0.1 MG/GM vaginal cream INSERT 1/2 TO 1 APPLICATORFUL VAGINALLY 3 TIMES A WEEK 42.5 g 0  . FLUoxetine (PROZAC) 40 MG capsule TAKE 1 CAPSULE(40 MG) BY MOUTH DAILY. 90 capsule 0  . gabapentin (NEURONTIN) 600 MG tablet TAKE 1 TABLET( 600 MG TOTAL) BY MOUTH IN THE MORNING AND AFTERNOON. IN EVENING CAN TAKE 2 TABLETS( 1200 MG TOTAL) 120 tablet 1  . ipratropium (ATROVENT HFA) 17 MCG/ACT inhaler Inhale 2 puffs into the lungs every 4 (four) hours as needed for wheezing. 12.9 g 3  . lisinopril (PRINIVIL,ZESTRIL) 10 MG tablet Take 1.5 tablets (15 mg total) by mouth 2 (two) times daily. 180 tablet 3  . nitroGLYCERIN (NITROSTAT) 0.4 MG SL tablet Place 0.4 mg under the tongue every 5 (five) minutes as needed for chest pain.    . ranolazine (RANEXA) 500 MG 12 hr tablet TAKE 1 TABLET BY MOUTH TWO  TIMES DAILY 180 tablet 3  . traZODone (DESYREL) 100 MG tablet Take 1-2 tablets (100-200 mg total) by mouth at bedtime as needed for sleep. 30 tablet 0  . diphenhydrAMINE (BENADRYL) 50 MG tablet Take 1 tablet (50 mg total) by mouth once for 1 dose. 1 hour prior to contrast administration 1 tablet 0  . pravastatin (PRAVACHOL) 40 MG tablet Take 1 tablet (40 mg total) by mouth every evening. (Patient not taking: Reported on 05/03/2017) 90 tablet 1  . predniSONE (DELTASONE) 50 MG tablet 50 mg oral prednisone administered orally 13, 7, and 1 hour prior to contrast administration (Patient not taking: Reported on 05/03/2017) 3 tablet 0  . traZODone (DESYREL) 100 MG tablet TAKE 1/2 TO 1 TABLET(50 TO 100 MG) BY MOUTH AT BEDTIME AS NEEDED FOR SLEEP (Patient not taking: Reported on 05/03/2017) 90 tablet 0   No current facility-administered medications for this visit.     Allergies  Allergen Reactions  . Albuterol Palpitations and Other (See Comments)    HALLUCINATIONS Also feels "funny in the head"  . Dilaudid [Hydromorphone] Anaphylaxis    Caused patient to  have heart attack and crazy in the head  . Ivp Dye [Iodinated Diagnostic Agents] Other (See Comments)    Itching and causes me to pass out  . Metrizamide Other (See Comments), Itching, Rash and Hives    Itching and causes me to pass out  . Percocet [Oxycodone-Acetaminophen] Itching  . Sulfa Antibiotics Hives and Other (See Comments)    Itching and causes me to pass out**PT CAN TAKE BENADRYL 2 HOURS PRIOR AND IS OK**   . Tape Itching  . Levaquin [Levofloxacin In D5w] Other (See Comments)    "I get real dizzy and pass out"    . Codeine Itching and Hives  .  Lanolin Hives    "eats skin off"  . Latex Other (See Comments)    LEAVES REALLY BAD BURN ON SKIN   . Percodan [Oxycodone-Aspirin]   . Sulfasalazine Hives  . Nickel Rash  . Penicillins Hives  . Sulfamethoxazole Rash and Hives      Review of Systems:  Constitutional:  No  fever, no chills, No recent illness  HEENT: No  headache, no vision change  Cardiac: No  chest pain  Respiratory:  No  shortness of breath. No  Cough  Gastrointestinal: No  abdominal pain  Musculoskeletal: No new myalgia/arthralgia  Neurologic: No  weakness, No  dizziness  Exam:  BP (!) 146/84   Pulse 69   Wt 208 lb 9.6 oz (94.6 kg)   BMI 34.71 kg/m   Constitutional: VS see above. General Appearance: alert, well-developed, well-nourished, NAD  Eyes: Normal lids and conjunctive, non-icteric sclera  Neck: No masses, trachea midline.   Respiratory: Normal respiratory effort. no wheeze, no rhonchi, no rales  Cardiovascular: S1/S2 normal, no murmur, no rub/gallop auscultated. RRR.   Musculoskeletal: Gait favors left side.  Neurological: Normal balance/coordination. No tremor  Psychiatric: Normal judgment/insight. Normal mood and affect. Oriented x3.      ASSESSMENT/PLAN:   Chronic bilateral low back pain without sciatica  Chronic back pain - Pain management referral placed, limited dose tramadol  S/P insertion of spinal cord  stimulator   Meds ordered this encounter  Medications  . traMADol (ULTRAM) 50 MG tablet    Sig: Take 2 tablets (100 mg total) by mouth 3 (three) times daily. As needed for pain    Dispense:  180 tablet    Refill:  0      Visit summary with medication list and pertinent instructions was printed for patient to review. All questions at time of visit were answered - patient instructed to contact office with any additional concerns. ER/RTC precautions were reviewed with the patient.   Follow-up plan: Return in about 1 month (around 05/31/2017) for recheck pain medicine unless able to get in to see pain management before then .   Please note: voice recognition software was used to produce this document, and typos may escape review. Please contact Dr. Sheppard Coil for any needed clarifications.

## 2017-05-04 ENCOUNTER — Encounter: Payer: Self-pay | Admitting: Osteopathic Medicine

## 2017-05-17 DIAGNOSIS — K623 Rectal prolapse: Secondary | ICD-10-CM | POA: Diagnosis not present

## 2017-05-17 DIAGNOSIS — N952 Postmenopausal atrophic vaginitis: Secondary | ICD-10-CM | POA: Diagnosis not present

## 2017-05-23 NOTE — Progress Notes (Signed)
HPI: FU coronary artery disease. CABG 2013; pt had PCI of PDA and PLA following CABG. Cardiac catheterization April 2016 showed severe three-vessel coronary disease, patent LIMA to the LAD, patent saphenous vein graft to the ramus intermedius, patent saphenous vein graft to the PDA with patent stents in the PDA beyond the graft insertion site, normal LV systolic function. Holter monitor January 2017 interpreted as sinus rhythm with possible atrial flutter, PACs, atrial couplets and atrial triplets. I obtained the strips from [place] and felt that it showed sinus with PAT. Anticoagulation discontinued. Nuclear study November 2017 showed ejection fraction 66% with normal perfusion.  Echocardiogram November 2019 showed normal LV function.  Since last seen,patient has mild dyspnea on exertion but no orthopnea, PND or pedal edema.  Occasional vague chest discomfort but no exertional chest pain and symptoms are unchanged compared to last visit.  No syncope.   Current Outpatient Medications  Medication Sig Dispense Refill  . aspirin 81 MG tablet Take 162 mg by mouth daily.     . carvedilol (COREG) 12.5 MG tablet Take 6 mg by mouth 2 (two) times daily.   0  . cetirizine (ZYRTEC) 5 MG tablet Take 1 tablet (5 mg total) by mouth daily. 30 tablet 0  . Cholecalciferol (VITAMIN D3) 5000 units CAPS Take 1 capsule by mouth daily.    Marland Kitchen estradiol (ESTRACE) 0.1 MG/GM vaginal cream INSERT 1/2 TO 1 APPLICATORFUL VAGINALLY 3 TIMES A WEEK 42.5 g 0  . FLUoxetine (PROZAC) 40 MG capsule TAKE 1 CAPSULE(40 MG) BY MOUTH DAILY. 90 capsule 0  . gabapentin (NEURONTIN) 600 MG tablet TAKE 1 TABLET( 600 MG TOTAL) BY MOUTH IN THE MORNING AND AFTERNOON. IN EVENING CAN TAKE 2 TABLETS( 1200 MG TOTAL) 120 tablet 1  . ipratropium (ATROVENT HFA) 17 MCG/ACT inhaler Inhale 2 puffs into the lungs every 4 (four) hours as needed for wheezing. 12.9 g 3  . lisinopril (PRINIVIL,ZESTRIL) 10 MG tablet Take 1.5 tablets (15 mg total) by mouth  2 (two) times daily. 180 tablet 3  . nitroGLYCERIN (NITROSTAT) 0.4 MG SL tablet Place 0.4 mg under the tongue every 5 (five) minutes as needed for chest pain.    . ranolazine (RANEXA) 500 MG 12 hr tablet TAKE 1 TABLET BY MOUTH TWO  TIMES DAILY 180 tablet 3  . traMADol (ULTRAM) 50 MG tablet Take 2 tablets (100 mg total) by mouth 3 (three) times daily. As needed for pain 180 tablet 0  . diphenhydrAMINE (BENADRYL) 50 MG tablet Take 1 tablet (50 mg total) by mouth once for 1 dose. 1 hour prior to contrast administration 1 tablet 0   No current facility-administered medications for this visit.      Past Medical History:  Diagnosis Date  . Chronic fatigue   . Depression   . Fatigue 08/16/2016  . Fibromyalgia   . Heart attack ([place])   . Hx of CABG   . Hyperlipidemia   . Hypertension   . Migraine     Past Surgical History:  Procedure Laterality Date  . ABDOMINAL HYSTERECTOMY    . APPENDECTOMY    . BACK SURGERY    . BACK SURGERY     Lumbar rods  . BLADDER SURGERY    . BREAST BIOPSY Left    x 2  . BREAST EXCISIONAL BIOPSY Left    x 2  . BREAST LUMPECTOMY     left breast all benign (x2)  . CARPAL TUNNEL RELEASE Bilateral   . CORONARY ARTERY  BYPASS GRAFT     Triple  . LYMPECTOMY  1980  . TONSILLECTOMY    . VAGINA SURGERY    . WRIST SURGERY      Social History   Socioeconomic History  . Marital status: Married    Spouse name: Not on file  . Number of children: 2  . Years of education: 71  . Highest education level: Not on file  Occupational History  . Occupation: Retired  Scientific laboratory technician  . Financial resource strain: Not on file  . Food insecurity:    Worry: Not on file    Inability: Not on file  . Transportation needs:    Medical: Not on file    Non-medical: Not on file  Tobacco Use  . Smoking status: Former Smoker    Last attempt to quit: 2003    Years since quitting: 16.3  . Smokeless tobacco: Never Used  Substance and Sexual Activity  . Alcohol use: Yes     Comment: 1 q wk  . Drug use: No  . Sexual activity: Not on file    Comment: Married  Lifestyle  . Physical activity:    Days per week: Not on file    Minutes per session: Not on file  . Stress: Not on file  Relationships  . Social connections:    Talks on phone: Not on file    Gets together: Not on file    Attends religious service: Not on file    Active member of club or organization: Not on file    Attends meetings of clubs or organizations: Not on file    Relationship status: Not on file  . Intimate partner violence:    Fear of current or ex partner: Not on file    Emotionally abused: Not on file    Physically abused: Not on file    Forced sexual activity: Not on file  Other Topics Concern  . Not on file  Social History Narrative   Lives at home w/ her husband   Right-handed   Caffeine: 2 cups per day    Family History  Problem Relation Age of Onset  . Cancer Mother   . Hyperlipidemia Father   . Hypertension Father   . Stroke Father   . Heart attack Father   . Hyperlipidemia Maternal Grandmother   . Hypertension Maternal Grandmother   . Stroke Maternal Grandmother   . Hemophilia Maternal Grandfather     ROS: Fatigue but no fevers or chills, productive cough, hemoptysis, dysphasia, odynophagia, melena, hematochezia, dysuria, hematuria, rash, seizure activity, orthopnea, PND, pedal edema, claudication. Remaining systems are negative.  Physical Exam: Well-developed obese in no acute distress.  Skin is warm and dry.  HEENT is normal.  Neck is supple.  Chest is clear to auscultation with normal expansion.  Cardiovascular exam is regular rate and rhythm.  Abdominal exam nontender or distended. No masses palpated. Extremities show no edema. neuro grossly intact  A/P  1 coronary artery disease status post coronary artery bypass graft-continue aspirin and statin.  2 hypertension-blood pressure is elevated.  Increase lisinopril to 40 mg daily.  Check potassium and  renal function in 1 week.  3 hyperlipidemia-patient has not tolerated Lipitor or pravastatin.  Check fasting lipids.  If elevated will refer to lipid clinic for consideration of Repatha.  4 question history of atrial flutter-I did review outside strips and felt it was likely brief PAT.  Anticoagulation has been discontinued.  5 chest pain-mild chest discomfort at times.  Symptoms are unchanged.  No plans for further ischemia evaluation at this point.  Kirk Ruths, MD

## 2017-05-29 DIAGNOSIS — H35371 Puckering of macula, right eye: Secondary | ICD-10-CM | POA: Diagnosis not present

## 2017-05-29 DIAGNOSIS — H3561 Retinal hemorrhage, right eye: Secondary | ICD-10-CM | POA: Diagnosis not present

## 2017-05-29 DIAGNOSIS — D3131 Benign neoplasm of right choroid: Secondary | ICD-10-CM | POA: Diagnosis not present

## 2017-05-31 ENCOUNTER — Ambulatory Visit: Payer: Medicare Other | Admitting: Cardiology

## 2017-05-31 ENCOUNTER — Ambulatory Visit: Payer: Medicare Other | Admitting: Osteopathic Medicine

## 2017-05-31 ENCOUNTER — Encounter

## 2017-05-31 ENCOUNTER — Encounter: Payer: Self-pay | Admitting: Cardiology

## 2017-05-31 VITALS — BP 154/81 | HR 81 | Ht 65.0 in | Wt 209.1 lb

## 2017-05-31 DIAGNOSIS — E7800 Pure hypercholesterolemia, unspecified: Secondary | ICD-10-CM

## 2017-05-31 DIAGNOSIS — I10 Essential (primary) hypertension: Secondary | ICD-10-CM | POA: Diagnosis not present

## 2017-05-31 DIAGNOSIS — I25810 Atherosclerosis of coronary artery bypass graft(s) without angina pectoris: Secondary | ICD-10-CM | POA: Diagnosis not present

## 2017-05-31 MED ORDER — LISINOPRIL 40 MG PO TABS: 40.0000 mg | ORAL_TABLET | Freq: Two times a day (BID) | ORAL | 3 refills | Status: DC

## 2017-05-31 MED ORDER — ASPIRIN EC 325 MG PO TBEC: 325.0000 mg | DELAYED_RELEASE_TABLET | Freq: Every day | ORAL | 0 refills | Status: DC

## 2017-05-31 MED ORDER — NITROGLYCERIN 0.4 MG SL SUBL: 0.4000 mg | SUBLINGUAL_TABLET | SUBLINGUAL | 12 refills | Status: AC | PRN

## 2017-05-31 NOTE — Patient Instructions (Signed)
Medication Instructions:   INCREASE LISINOPRIL TO 40 MG ONCE DAILY= 4 OF THE 10 MG TABLETS ONCE DAILY  INCREASE ASPIRIN TO 325 MG ONCE DAILY=4 OF THE 81 MG ASPIRIN ONCE DAILY  Labwork:  Your physician recommends that you return for lab work in: [place]:  Your physician wants you to follow-up in: [place] will receive a reminder letter in the mail two months in advance. If you don't receive a letter, please call our office to schedule the follow-up appointment.   If you need a refill on your cardiac medications before your next appointment, please call your pharmacy.

## 2017-06-02 ENCOUNTER — Other Ambulatory Visit: Payer: Self-pay | Admitting: Osteopathic Medicine

## 2017-06-02 NOTE — Telephone Encounter (Signed)
[facility] pharmacy requesting med RF for tramadol. 

## 2017-06-06 ENCOUNTER — Ambulatory Visit (INDEPENDENT_AMBULATORY_CARE_PROVIDER_SITE_OTHER): Payer: Medicare Other | Admitting: Osteopathic Medicine

## 2017-06-06 ENCOUNTER — Encounter: Payer: Self-pay | Admitting: Osteopathic Medicine

## 2017-06-06 VITALS — BP 140/70 | HR 91 | Temp 98.2°F | Wt 204.2 lb

## 2017-06-06 DIAGNOSIS — Z9689 Presence of other specified functional implants: Secondary | ICD-10-CM

## 2017-06-06 DIAGNOSIS — Z9889 Other specified postprocedural states: Secondary | ICD-10-CM

## 2017-06-06 DIAGNOSIS — M545 Low back pain: Secondary | ICD-10-CM

## 2017-06-06 DIAGNOSIS — G8929 Other chronic pain: Secondary | ICD-10-CM | POA: Diagnosis not present

## 2017-06-06 MED ORDER — TRAMADOL HCL 50 MG PO TABS: 100.0000 mg | ORAL_TABLET | Freq: Three times a day (TID) | ORAL | 0 refills | Status: DC

## 2017-06-06 MED ORDER — ONDANSETRON 8 MG PO TBDP: 8.0000 mg | ORAL_TABLET | Freq: Three times a day (TID) | ORAL | 3 refills | Status: DC | PRN

## 2017-06-06 NOTE — Progress Notes (Signed)
HPI: Carrie Salazar is a 72 y.o. female who  has a past medical history of Chronic fatigue, Depression, Fatigue (08/16/2016), Fibromyalgia, Heart attack ([place]), CABG, Hyperlipidemia, Hypertension, and Migraine.  she presents to [facility] today, 06/06/17,  for chief complaint of: Back and L hip pain  Implanted device on L hip, fell awhile back and thinks she may have damaged the stimulator. Previously we were unsuccessful getting hre in to see neurosurgery, so she requested pain management referral and this order was placed, patient has appt in July.   States that the stimulator keeps shocking her, she is in a lot of pain in the lower back. Pain has been severe to the point where she is having difficulty with activities of daily living, walking around the house, making meals, going outside.   Tramadol is helping. She is taking 2 tramadol 3 times per day, consistent with the written prescription. PMP reviewed today and no concerns.  She is here for refill on tramadol enough to last until her pain management appointment    Past medical, surgical, social and family history reviewed:  Patient Active Problem List   Diagnosis Date Noted  . Palpitations 02/01/2017  . Folate deficiency 02/01/2017  . History of anemia 02/01/2017  . B12 deficiency 02/01/2017  . Migraine   . Hypertension   . Hyperlipidemia   . Hx of CABG   . Heart attack ([place])   . Fibromyalgia   . Depression   . Chronic fatigue   . Trochanteric bursitis, left hip 11/30/2016  . Degenerative arthritis of thumb, right 11/30/2016  . Fatigue 08/16/2016  . Vitamin D deficiency 08/09/2016  . Neuropathy 05/24/2016  . Elevated serum creatinine 03/31/2016  . Blood pressure instability 03/31/2016  . Other headache syndrome 02/25/2016  . SOB (shortness of breath) on exertion 01/25/2016  . Anemia 01/25/2016  . Vaginal lesion 09-09-2015  . Moderate episode of recurrent major depressive disorder ([place])  12/10/2015  . Female cystocele 10/16/2015  . Urinary urgency 10/16/2015  . History of obstructive sleep apnea 08/20/2015  . Atrial flutter ([place]) 08/20/2015  . Coronary artery disease involving coronary bypass graft of native heart with angina pectoris ([place]) 08/13/2015  . Chronic back pain 08/13/2015  . Recurrent UTI 08/13/2015  . Insomnia 08/13/2015  . Hyperhidrosis 08/13/2015    Past Surgical History:  Procedure Laterality Date  . ABDOMINAL HYSTERECTOMY    . APPENDECTOMY    . BACK SURGERY    . BACK SURGERY     Lumbar rods  . BLADDER SURGERY    . BREAST BIOPSY Left    x 2  . BREAST EXCISIONAL BIOPSY Left    x 2  . BREAST LUMPECTOMY     left breast all benign (x2)  . CARPAL TUNNEL RELEASE Bilateral   . CORONARY ARTERY BYPASS GRAFT     Triple  . LYMPECTOMY  1980  . TONSILLECTOMY    . VAGINA SURGERY    . WRIST SURGERY      Social History   Tobacco Use  . Smoking status: Former Smoker    Last attempt to quit: 2003    Years since quitting: 16.4  . Smokeless tobacco: Never Used  Substance Use Topics  . Alcohol use: Yes    Comment: 1 q wk    Family History  Problem Relation Age of Onset  . Cancer Mother   . Hyperlipidemia Father   . Hypertension Father   . Stroke Father   . Heart attack  Father   . Hyperlipidemia Maternal Grandmother   . Hypertension Maternal Grandmother   . Stroke Maternal Grandmother   . Hemophilia Maternal Grandfather      Current medication list and allergy/intolerance information reviewed:    Current Outpatient Medications  Medication Sig Dispense Refill  . aspirin EC 325 MG tablet Take 1 tablet (325 mg total) by mouth daily. 30 tablet 0  . carvedilol (COREG) 12.5 MG tablet Take 6 mg by mouth 2 (two) times daily.   0  . cetirizine (ZYRTEC) 5 MG tablet Take 1 tablet (5 mg total) by mouth daily. 30 tablet 0  . Cholecalciferol (VITAMIN D3) 5000 units CAPS Take 1 capsule by mouth daily.    Marland Kitchen estradiol (ESTRACE) 0.1 MG/GM vaginal cream  INSERT 1/2 TO 1 APPLICATORFUL VAGINALLY 3 TIMES A WEEK 42.5 g 0  . FLUoxetine (PROZAC) 40 MG capsule TAKE 1 CAPSULE(40 MG) BY MOUTH DAILY. 90 capsule 0  . gabapentin (NEURONTIN) 600 MG tablet TAKE 1 TABLET( 600 MG TOTAL) BY MOUTH IN THE MORNING AND AFTERNOON. IN EVENING CAN TAKE 2 TABLETS( 1200 MG TOTAL) 120 tablet 1  . ipratropium (ATROVENT HFA) 17 MCG/ACT inhaler Inhale 2 puffs into the lungs every 4 (four) hours as needed for wheezing. 12.9 g 3  . lisinopril (PRINIVIL,ZESTRIL) 40 MG tablet Take 1 tablet (40 mg total) by mouth 2 (two) times daily. 90 tablet 3  . nitroGLYCERIN (NITROSTAT) 0.4 MG SL tablet Place 1 tablet (0.4 mg total) under the tongue every 5 (five) minutes as needed for chest pain. 25 tablet 12  . ranolazine (RANEXA) 500 MG 12 hr tablet TAKE 1 TABLET BY MOUTH TWO  TIMES DAILY 180 tablet 3  . diphenhydrAMINE (BENADRYL) 50 MG tablet Take 1 tablet (50 mg total) by mouth once for 1 dose. 1 hour prior to contrast administration 1 tablet 0   No current facility-administered medications for this visit.     Allergies  Allergen Reactions  . Albuterol Palpitations and Other (See Comments)    HALLUCINATIONS Also feels "funny in the head"  . Dilaudid [Hydromorphone] Anaphylaxis    Caused patient to have heart attack and crazy in the head  . Ivp Dye [Iodinated Diagnostic Agents] Other (See Comments)    Itching and causes me to pass out  . Metrizamide Other (See Comments), Itching, Rash and Hives    Itching and causes me to pass out  . Percocet [Oxycodone-Acetaminophen] Itching  . Sulfa Antibiotics Hives and Other (See Comments)    Itching and causes me to pass out**PT CAN TAKE BENADRYL 2 HOURS PRIOR AND IS OK**   . Tape Itching  . Levaquin [Levofloxacin In D5w] Other (See Comments)    "I get real dizzy and pass out"    . Codeine Itching and Hives  . Hydromorphone Hcl   . Lanolin Hives    "eats skin off"  . Latex Other (See Comments)    LEAVES REALLY BAD BURN ON SKIN   .  Percodan [Oxycodone-Aspirin]   . Sulfasalazine Hives  . Nickel Rash  . Penicillins Hives  . Sulfamethoxazole Rash and Hives      Review of Systems:  Constitutional:  No  fever, no chills, No recent illness  HEENT: No  headache, no vision change  Cardiac: No  chest pain  Respiratory:  No  shortness of breath. No  Cough  Gastrointestinal: No  abdominal pain  Musculoskeletal: No new myalgia/arthralgia  Neurologic: No  weakness, No  dizziness  Exam:  BP (!) 146/61 (  BP Location: Left Arm, Patient Position: Sitting, Cuff Size: Large)   Pulse 91   Temp 98.2 F (36.8 C) (Oral)   Wt 204 lb 3.2 oz (92.6 kg)   BMI 33.98 kg/m   Constitutional: VS see above. General Appearance: alert, well-developed, well-nourished, NAD  Eyes: Normal lids and conjunctive, non-icteric sclera  Neck: No masses, trachea midline.   Respiratory: Normal respiratory effort. no wheeze, no rhonchi, no rales  Cardiovascular: S1/S2 normal, no murmur, no rub/gallop auscultated. RRR.   Musculoskeletal: Gait favors left side.  Neurological: Normal balance/coordination. No tremor  Psychiatric: Normal judgment/insight. Normal mood and affect. Oriented x3.      ASSESSMENT/PLAN: The primary encounter diagnosis was Chronic bilateral low back pain without sciatica. A diagnosis of S/P insertion of spinal cord stimulator was also pertinent to this visit.  3 days tramadol refilled, additional prescription sent postdated last her through July.  Patient is strongly advised to keep pain management follow-up   Meds ordered this encounter  Medications  . DISCONTD: traMADol (ULTRAM) 50 MG tablet    Sig: Take 2 tablets (100 mg total) by mouth 3 (three) times daily. As needed for pain    Dispense:  180 tablet    Refill:  0  . traMADol (ULTRAM) 50 MG tablet    Sig: Take 2 tablets (100 mg total) by mouth 3 (three) times daily. As needed for pain    Dispense:  180 tablet    Refill:  0  . ondansetron (ZOFRAN-ODT) 8  MG disintegrating tablet    Sig: Take 1 tablet (8 mg total) by mouth every 8 (eight) hours as needed for nausea.    Dispense:  20 tablet    Refill:  3      Visit summary with medication list and pertinent instructions was printed for patient to review. All questions at time of visit were answered - patient instructed to contact office with any additional concerns. ER/RTC precautions were reviewed with the patient.   Follow-up plan: Return in about 3 months (around 09/06/2017) for refill pain medications, unless can get in to see pain mgt before then, then they'll take over.   Please note: voice recognition software was used to produce this document, and typos may escape review. Please contact Dr. Sheppard Coil for any needed clarifications.

## 2017-06-06 NOTE — Telephone Encounter (Signed)
Noted  

## 2017-06-06 NOTE — Telephone Encounter (Signed)
Will address at visit today.

## 2017-06-06 NOTE — Patient Instructions (Signed)
[facility] at [phone]

## 2017-06-07 ENCOUNTER — Encounter: Payer: Self-pay | Admitting: Osteopathic Medicine

## 2017-06-21 ENCOUNTER — Telehealth: Payer: Self-pay | Admitting: Cardiology

## 2017-06-21 NOTE — Telephone Encounter (Signed)
Spoke with pt who reports that every since her lisinopril was increased from 20 mg BID to 40 mg on 5/22 she has been experiencing bilateral leg cramps from hip down that is progressively getting worse.She also reports she has been so tired to the point its a struggle to get out of bed. Pt states she can not find her BP machine and has not been able to check her BP since medication change. Routing to [name] for further recommendation.

## 2017-06-21 NOTE — Telephone Encounter (Signed)
New Message   Pt c/o medication issue:  1. Name of Medication: lisinopril (PRINIVIL,ZESTRIL) 40 MG tablet  2. How are you currently taking this medication (dosage and times per day)?   3. Are you having a reaction (difficulty breathing--STAT)?   4. What is your medication issue? Patient is having lisinopril. She states that she went from 20mg  to 40mg  twice a day. She says since the change she is experiencing pain in her legs. Please call.

## 2017-06-22 NOTE — Telephone Encounter (Signed)
Left message for patient of dr Jacalyn Lefevre recommendations. She will call with consistently high bp.

## 2017-06-22 NOTE — Telephone Encounter (Signed)
Ok to resume prior dose of lisinopril, needs to follow bp Kirk Ruths

## 2017-06-23 DIAGNOSIS — L821 Other seborrheic keratosis: Secondary | ICD-10-CM | POA: Diagnosis not present

## 2017-06-23 DIAGNOSIS — D485 Neoplasm of uncertain behavior of skin: Secondary | ICD-10-CM | POA: Diagnosis not present

## 2017-06-23 DIAGNOSIS — B078 Other viral warts: Secondary | ICD-10-CM | POA: Diagnosis not present

## 2017-06-29 ENCOUNTER — Encounter: Payer: Self-pay | Admitting: Family Medicine

## 2017-06-29 ENCOUNTER — Ambulatory Visit (INDEPENDENT_AMBULATORY_CARE_PROVIDER_SITE_OTHER): Payer: Medicare Other | Admitting: Family Medicine

## 2017-06-29 ENCOUNTER — Other Ambulatory Visit: Payer: Self-pay | Admitting: Osteopathic Medicine

## 2017-06-29 VITALS — BP 147/63 | HR 77 | Wt 204.0 lb

## 2017-06-29 DIAGNOSIS — M533 Sacrococcygeal disorders, not elsewhere classified: Secondary | ICD-10-CM

## 2017-06-29 DIAGNOSIS — G5701 Lesion of sciatic nerve, right lower limb: Secondary | ICD-10-CM

## 2017-06-29 DIAGNOSIS — G8929 Other chronic pain: Secondary | ICD-10-CM | POA: Diagnosis not present

## 2017-06-29 MED ORDER — TRAMADOL HCL 50 MG PO TABS: 100.0000 mg | ORAL_TABLET | Freq: Three times a day (TID) | ORAL | 0 refills | Status: DC

## 2017-06-29 NOTE — Patient Instructions (Addendum)
Thank you for coming in today. Continue tramadol as needed.  Take gabapentin 1 3x daily as needed.    Attend physical therapy.  Call or go to the ER if you develop a large red swollen joint with extreme pain or oozing puss.   Recheck with me in 3 weeks.    Return sooner if needed.

## 2017-06-29 NOTE — Progress Notes (Signed)
Refill early d/t travel plans

## 2017-06-30 NOTE — Progress Notes (Signed)
Carrie Salazar is a 72 y.o. female who presents to [facility] today for left hip pain and right buttocks and leg pain.  [name] notes a ongoing intermittent chronic pain in her left low back and lateral hip thought to be trochanteric bursitis versus lumbar pain.  She had a trochanteric injection in November 2018 which is provided pretty good pain relief until recently.  She has pain in her left low back and lateral hip worse with activity better with rest.  She denies any significant left-sided radiating pain weakness or numbness.  However [name] notes a 2-week history of pain in her right buttocks radiating down the posterior thigh.  She has pain is worse with activity and better with rest.  She denies any injury fevers or chills vomiting or diarrhea.  She denies significant weakness.    ROS:  As above  Exam:  BP (!) 147/63   Pulse 77   Wt 204 lb (92.5 kg)   BMI 33.95 kg/m  General: Well Developed, well nourished, and in no acute distress.  Neuro/Psych: Alert and oriented x3, extra-ocular muscles intact, able to move all 4 extremities, sensation grossly intact. Skin: Warm and dry, no rashes noted.  Respiratory: Not using accessory muscles, speaking in full sentences, trachea midline.  Cardiovascular: Pulses palpable, no extremity edema. Abdomen: Does not appear distended. MSK:  L-spine nontender to midline.  Tender to palpation bilateral SI joints worse in the left. Additionally patient has significant tenderness in the right buttocks near the piriformis muscle.  Resisted hip rotation reproduces the pain. Hip motion bilaterally is normal.  Hip abduction strength is decreased bilaterally 4/5.  Procedure: Real-time Ultrasound Guided Injection of left SI joint Device: GE Logiq E   Images permanently stored and available for review in the ultrasound unit. Verbal informed consent obtained.  Discussed risks and benefits of procedure. Warned  about infection bleeding damage to structures skin hypopigmentation and fat atrophy among others. Patient expresses understanding and agreement Time-out conducted.   Noted no overlying erythema, induration, or other signs of local infection.   Skin prepped in a sterile fashion.   Local anesthesia: Topical Ethyl chloride.   With sterile technique and under real time ultrasound guidance:  80 mg of Kenalog and 3 mL of Marcaine injected easily.   Completed without difficulty   Pain in the left SI region immediately resolved suggesting accurate placement of the medication.   Advised to call if fevers/chills, erythema, induration, drainage, or persistent bleeding.   Images permanently stored and available for review in the ultrasound unit.  Impression: Technically successful ultrasound guided injection.    Assessment and Plan: 72 y.o. female with  Left lateral and posterior back hip pain likely due to SI joint dysfunction possibly some trochanteric bursitis.  Injected today.  Plan to proceed with physical therapy and recheck in the near future.  Right buttocks pain with radiating pain.  Very likely piriformis syndrome.  Plan for physical therapy trial.  Recheck in the near future.  Return sooner if needed.    Orders Placed This Encounter  Procedures  . Ambulatory referral to Physical Therapy    Referral Priority:   Routine    Referral Type:   Physical Medicine    Referral Reason:   Specialty Services Required    Requested Specialty:   Physical Therapy   No orders of the defined types were placed in this encounter.   Historical information moved to improve visibility of documentation.  Past Medical  History:  Diagnosis Date  . Chronic fatigue   . Depression   . Fatigue 08/16/2016  . Fibromyalgia   . Heart attack ([place])   . Hx of CABG   . Hyperlipidemia   . Hypertension   . Migraine    Past Surgical History:  Procedure Laterality Date  . ABDOMINAL HYSTERECTOMY    . APPENDECTOMY     . BACK SURGERY    . BACK SURGERY     Lumbar rods  . BLADDER SURGERY    . BREAST BIOPSY Left    x 2  . BREAST EXCISIONAL BIOPSY Left    x 2  . BREAST LUMPECTOMY     left breast all benign (x2)  . CARPAL TUNNEL RELEASE Bilateral   . CORONARY ARTERY BYPASS GRAFT     Triple  . LYMPECTOMY  1980  . TONSILLECTOMY    . VAGINA SURGERY    . WRIST SURGERY     Social History   Tobacco Use  . Smoking status: Former Smoker    Last attempt to quit: 2003    Years since quitting: 16.4  . Smokeless tobacco: Never Used  Substance Use Topics  . Alcohol use: Yes    Comment: 1 q wk   family history includes Cancer in her mother; Heart attack in her father; Hemophilia in her maternal grandfather; Hyperlipidemia in her father and maternal grandmother; Hypertension in her father and maternal grandmother; Stroke in her father and maternal grandmother.  Medications: Current Outpatient Medications  Medication Sig Dispense Refill  . aspirin EC 325 MG tablet Take 1 tablet (325 mg total) by mouth daily. 30 tablet 0  . carvedilol (COREG) 12.5 MG tablet Take 6 mg by mouth 2 (two) times daily.   0  . cetirizine (ZYRTEC) 5 MG tablet Take 1 tablet (5 mg total) by mouth daily. 30 tablet 0  . Cholecalciferol (VITAMIN D3) 5000 units CAPS Take 1 capsule by mouth daily.    Marland Kitchen estradiol (ESTRACE) 0.1 MG/GM vaginal cream INSERT 1/2 TO 1 APPLICATORFUL VAGINALLY 3 TIMES A WEEK 42.5 g 0  . FLUoxetine (PROZAC) 40 MG capsule TAKE 1 CAPSULE(40 MG) BY MOUTH DAILY. 90 capsule 0  . gabapentin (NEURONTIN) 600 MG tablet TAKE 1 TABLET( 600 MG TOTAL) BY MOUTH IN THE MORNING AND AFTERNOON. IN EVENING CAN TAKE 2 TABLETS( 1200 MG TOTAL) 120 tablet 1  . ipratropium (ATROVENT HFA) 17 MCG/ACT inhaler Inhale 2 puffs into the lungs every 4 (four) hours as needed for wheezing. 12.9 g 3  . lisinopril (PRINIVIL,ZESTRIL) 40 MG tablet Take 1 tablet (40 mg total) by mouth 2 (two) times daily. 90 tablet 3  . nitroGLYCERIN (NITROSTAT) 0.4 MG  SL tablet Place 1 tablet (0.4 mg total) under the tongue every 5 (five) minutes as needed for chest pain. 25 tablet 12  . ondansetron (ZOFRAN-ODT) 8 MG disintegrating tablet Take 1 tablet (8 mg total) by mouth every 8 (eight) hours as needed for nausea. 20 tablet 3  . ranolazine (RANEXA) 500 MG 12 hr tablet TAKE 1 TABLET BY MOUTH TWO  TIMES DAILY 180 tablet 3  . diphenhydrAMINE (BENADRYL) 50 MG tablet Take 1 tablet (50 mg total) by mouth once for 1 dose. 1 hour prior to contrast administration 1 tablet 0  . [START ON 07/06/2017] traMADol (ULTRAM) 50 MG tablet Take 2 tablets (100 mg total) by mouth 3 (three) times daily. As needed for pain. OK to fill early d/t travel plans 180 tablet 0   No current facility-administered  medications for this visit.    Allergies  Allergen Reactions  . Albuterol Palpitations and Other (See Comments)    HALLUCINATIONS Also feels "funny in the head"  . Dilaudid [Hydromorphone] Anaphylaxis    Caused patient to have heart attack and crazy in the head  . Ivp Dye [Iodinated Diagnostic Agents] Other (See Comments)    Itching and causes me to pass out  . Metrizamide Other (See Comments), Itching, Rash and Hives    Itching and causes me to pass out  . Percocet [Oxycodone-Acetaminophen] Itching  . Sulfa Antibiotics Hives and Other (See Comments)    Itching and causes me to pass out**PT CAN TAKE BENADRYL 2 HOURS PRIOR AND IS OK**   . Tape Itching  . Levaquin [Levofloxacin In D5w] Other (See Comments)    "I get real dizzy and pass out"    . Codeine Itching and Hives  . Hydromorphone Hcl   . Lanolin Hives    "eats skin off"  . Latex Other (See Comments)    LEAVES REALLY BAD BURN ON SKIN   . Percodan [Oxycodone-Aspirin]   . Sulfasalazine Hives  . Nickel Rash  . Penicillins Hives  . Sulfamethoxazole Rash and Hives      Discussed warning signs or symptoms. Please see discharge instructions. Patient expresses understanding.

## 2017-07-03 ENCOUNTER — Telehealth: Payer: Self-pay

## 2017-07-03 IMAGING — MR MR HEAD WO/W CM
10 of 12 series · 34 of 48 positions shown · IV contrast (multihance)
Comparison: Prior CT from 02/29/2016.

CLINICAL DATA: Initial valuation for cluster migraines and nausea.
Recently fell backwards and hit head approximately 10 weeks ago.

EXAM:
MRI HEAD WITHOUT AND WITH CONTRAST
TECHNIQUE: Multiplanar, multiecho pulse sequences of the brain and surrounding
structures were obtained without and with intravenous contrast.
CONTRAST:  20 cc of MultiHance.

[Series 2: T1 · sagittal · 5.0mm · 0.47mm/px · 3 of 23 slices shown]
[im 1/23]
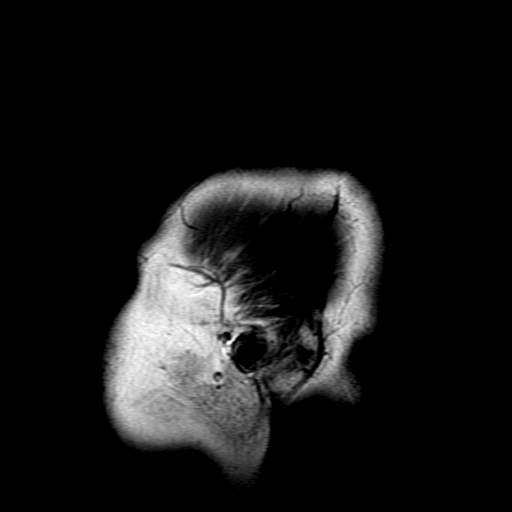
[im 12/23]
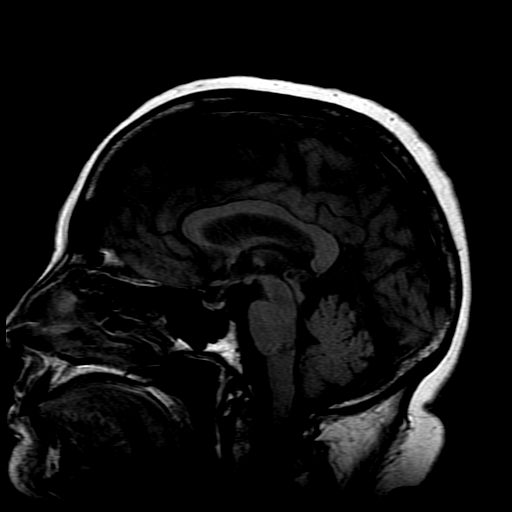
[im 23/23]
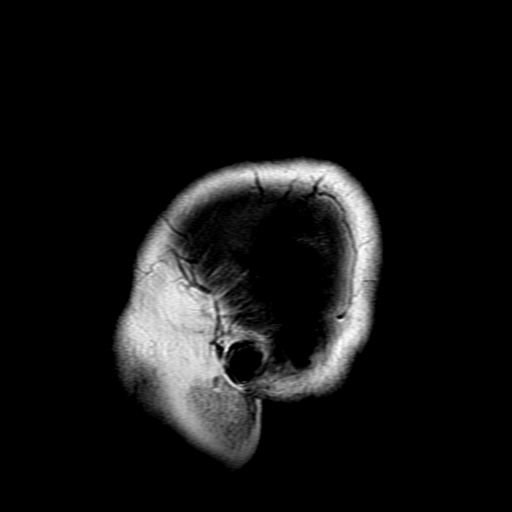

[Series 3: DWI · axial · 3.0mm · 1.09mm/px · z∈[-88,+43]mm · 9 of 88 slices shown (1 of 4)]
[im 1/88]
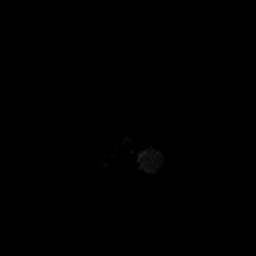
[im 11/88]
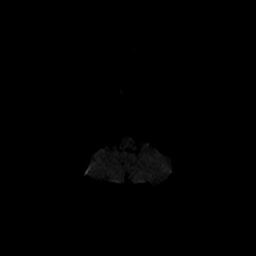
[im 22/88]
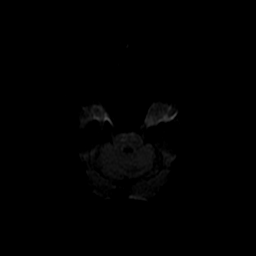
[im 33/88]
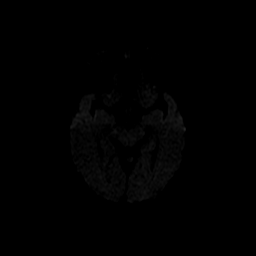
[im 44/88]
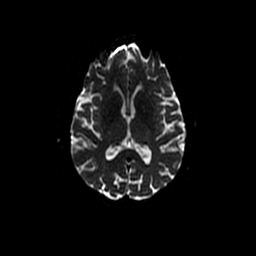
[im 55/88]
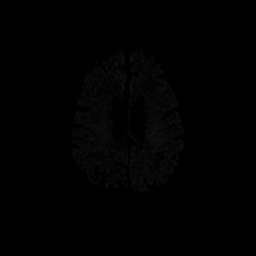
[im 66/88]
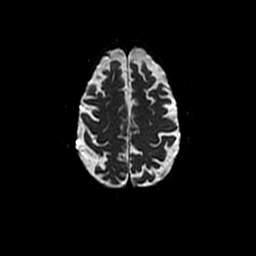
[im 77/88]
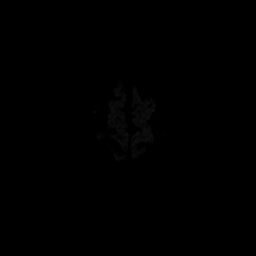
[im 88/88]
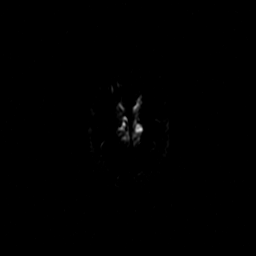

[Series 4: T2 · axial · 5.0mm · 0.43mm/px · z∈[-91,+40]mm · 2 of 23 slices shown]
[im 1/23]
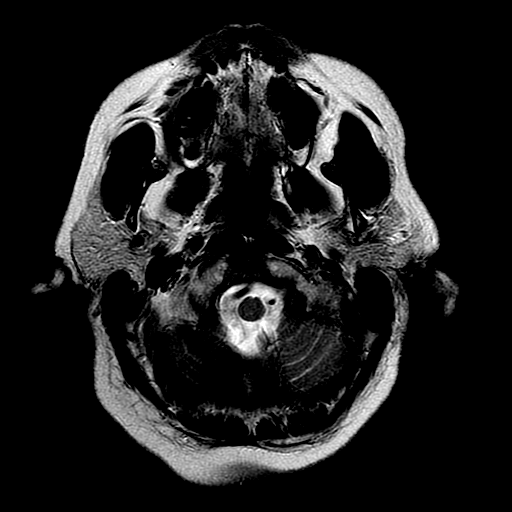
[im 23/23]
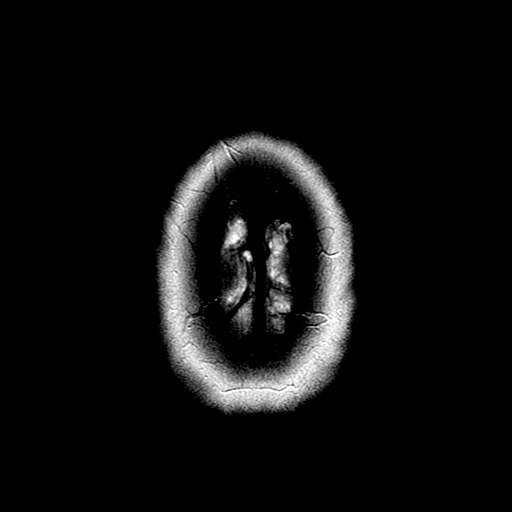

[Series 5: DWI · coronal · 5.0mm · 1.09mm/px · 6 of 64 slices shown (2 of 4)]
[im 1/64]
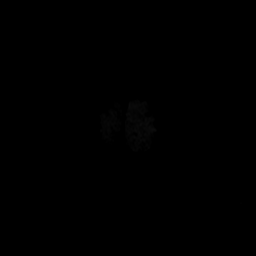
[im 13/64]
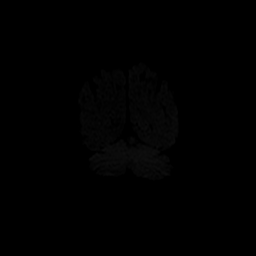
[im 26/64]
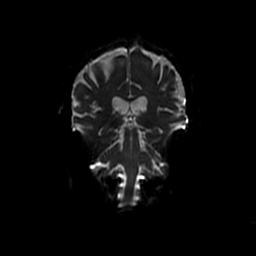
[im 38/64]
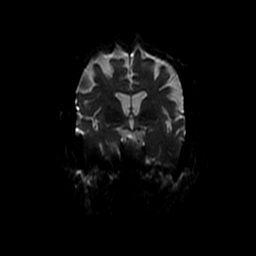
[im 51/64]
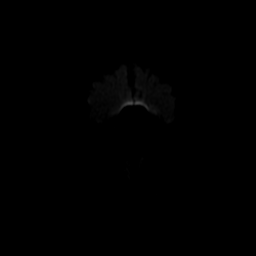
[im 64/64]
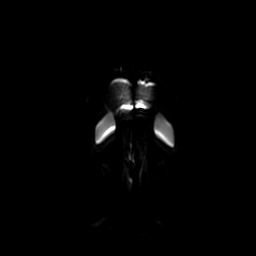

[Series 6: FLAIR · axial · 5.0mm · 0.43mm/px · z∈[-91,+40]mm · 2 of 23 slices shown]
[im 1/23]
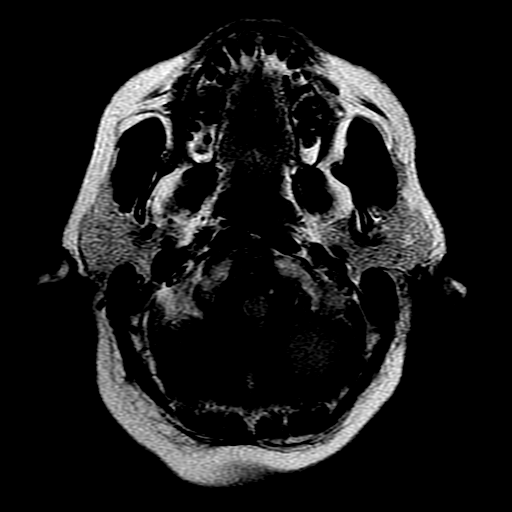
[im 23/23]
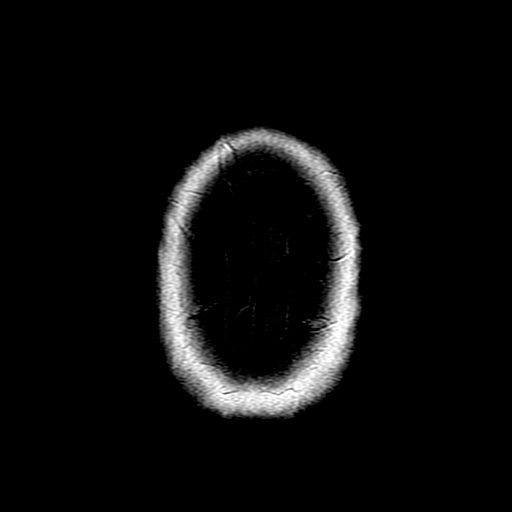

[Series 7: ax mpgr · axial · 5.0mm · 0.43mm/px · 1 of 23 slices shown]
[im 1/23]
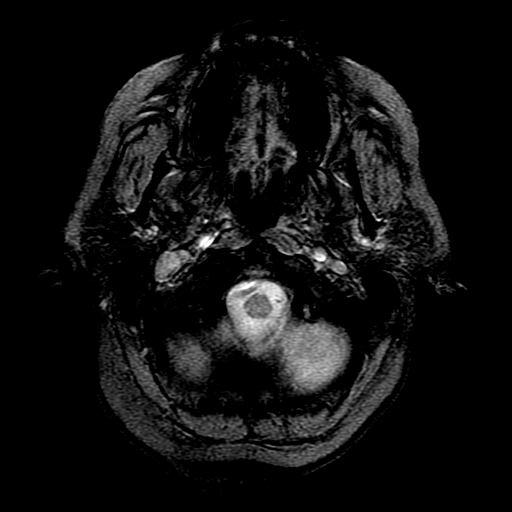

[Series 9: T2 post-contrast · coronal · 5.0mm · 0.39mm/px · 2 of 25 slices shown]
[im 1/25]
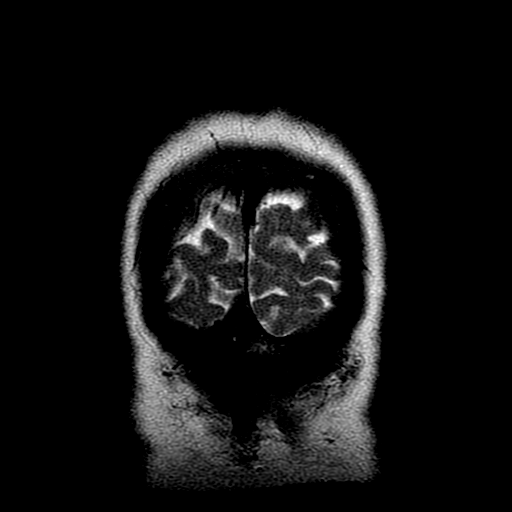
[im 25/25]
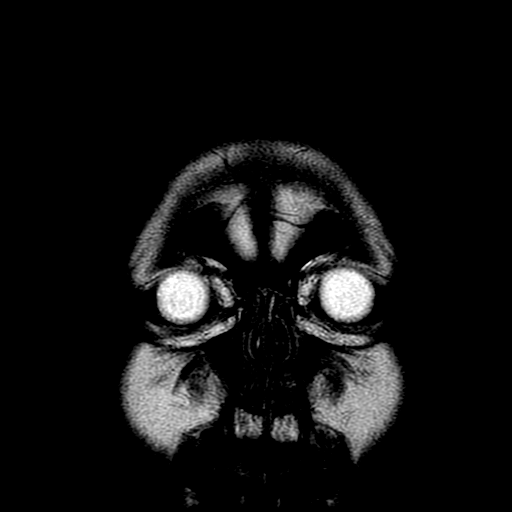

[Series 11: T1 post-contrast · coronal · 5.0mm · 0.39mm/px · 2 of 25 slices shown]
[im 1/25]
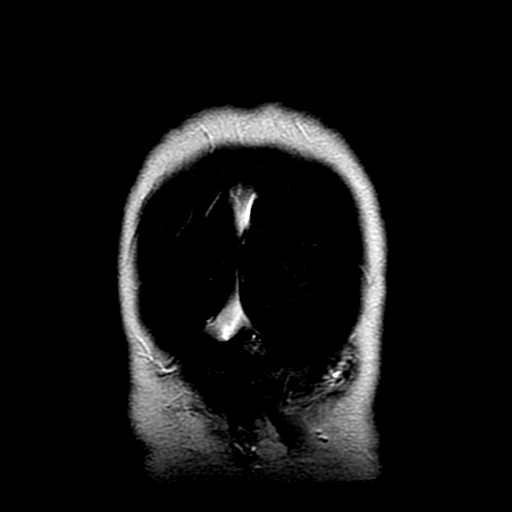
[im 25/25]
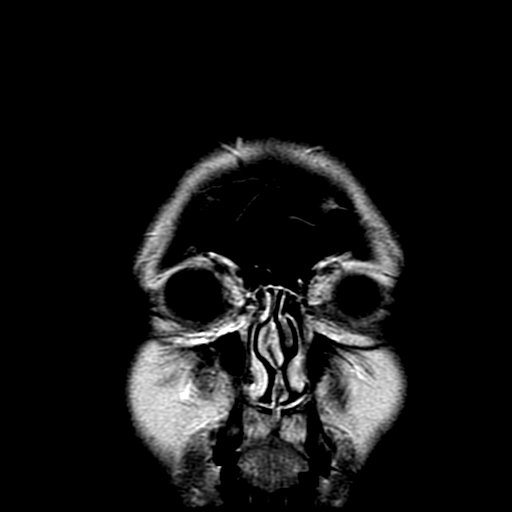

[Series 300: DWI · axial · 3.0mm · 1.09mm/px · z∈[-88,+43]mm · 4 of 44 slices shown (3 of 4)]
[im 1/44]
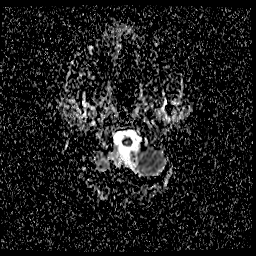
[im 15/44]
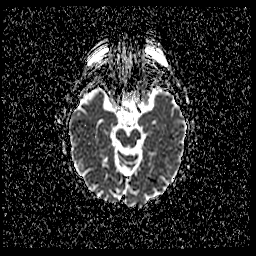
[im 29/44]
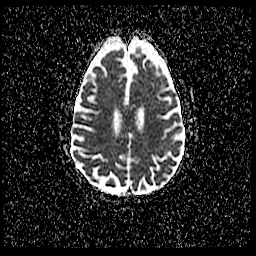
[im 44/44]
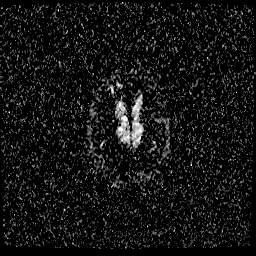

[Series 500: DWI · coronal · 5.0mm · 1.09mm/px · 3 of 32 slices shown (4 of 4)]
[im 1/32]
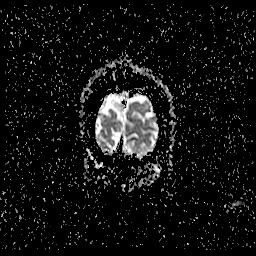
[im 16/32]
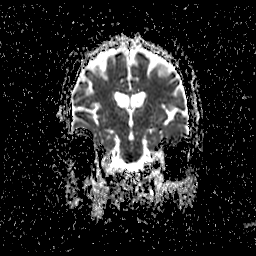
[im 32/32]
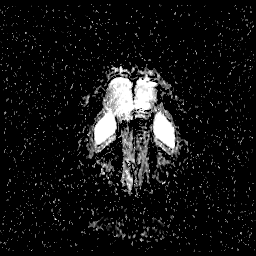

[34 of 48 positions shown; findings below may reference images not displayed]

FINDINGS: Brain: Generalized age-related cerebral atrophy present. Mild patchy
T2/FLAIR hyperintensity within the periventricular and deep white
matter, most like related chronic small vessel ischemic disease.
Chronic microvascular ischemic changes present within the pons as
well. Overall, changes are mild for age.

No abnormal foci of restricted diffusion to suggest acute or
subacute ischemia. Gray-white matter differentiation is maintained.
No areas of encephalomalacia to suggest chronic infarction. No foci
of susceptibility artifact to suggest acute or chronic intracranial
hemorrhage.

No mass lesion, midline shift, or mass effect. Ventricles are normal
in size without evidence for hydrocephalus. No extra-axial fluid
collection. Major dural sinuses are grossly patent.

No abnormal enhancement.

Pituitary gland and suprasellar region within normal limits. Midline
structures intact and normal.

Vascular: Major intracranial vascular flow voids well maintained.

Skull and upper cervical spine: Craniocervical junction within
normal limits. Visualized upper cervical spine unremarkable. Bone
marrow signal intensity within normal limits. No scalp soft tissue
abnormality.

Sinuses/Orbits: Globes and orbital soft tissues within normal
limits. Patient is status post lens extraction bilaterally.

Minimal retained secretions within the left sphenoid sinus.
Paranasal sinuses are otherwise clear. No mastoid effusion. Inner
ear structures within normal limits.

Other: No other significant finding.
IMPRESSION: 1. No acute or reversible intracranial process identified.
2. Mild for age chronic microvascular ischemic disease.
3. Otherwise normal brain MRI.

## 2017-07-03 MED ORDER — TRAMADOL HCL 50 MG PO TABS: 100.0000 mg | ORAL_TABLET | Freq: Three times a day (TID) | ORAL | 0 refills | Status: AC

## 2017-07-03 NOTE — Telephone Encounter (Signed)
Pt called stating she left for vacation on 07/01/17 & was unable to get tramadol rx. As per [facility] pharmacist, earliest date for tramadol was 07/02/17. Pt is going to be out of town for some time and is requesting a new rx be sent to an external [facility] in [place], [place] - [phone] [address] [address]), [phone] (fax). Pt is due to run out of medication. Pls advise. Thanks.

## 2017-07-03 NOTE — Telephone Encounter (Signed)
Rx sent to local pharmacy cancelled. Spoke to [place].

## 2017-07-03 NOTE — Telephone Encounter (Signed)
Sent Please call local pharmacy and cancel most recent Rx sent

## 2017-07-06 ENCOUNTER — Encounter: Payer: Self-pay | Admitting: *Deleted

## 2017-07-17 ENCOUNTER — Ambulatory Visit: Payer: Medicare Other | Admitting: Rehabilitative and Restorative Service Providers"

## 2017-07-17 ENCOUNTER — Other Ambulatory Visit: Payer: Self-pay

## 2017-07-17 DIAGNOSIS — G5793 Unspecified mononeuropathy of bilateral lower limbs: Secondary | ICD-10-CM

## 2017-07-17 MED ORDER — GABAPENTIN 600 MG PO TABS: ORAL_TABLET | ORAL | 1 refills | Status: DC

## 2017-07-19 ENCOUNTER — Other Ambulatory Visit: Payer: Self-pay | Admitting: Osteopathic Medicine

## 2017-07-19 DIAGNOSIS — F331 Major depressive disorder, recurrent, moderate: Secondary | ICD-10-CM

## 2017-07-20 ENCOUNTER — Ambulatory Visit (INDEPENDENT_AMBULATORY_CARE_PROVIDER_SITE_OTHER): Payer: Medicare Other

## 2017-07-20 ENCOUNTER — Encounter: Payer: Self-pay | Admitting: Family Medicine

## 2017-07-20 ENCOUNTER — Ambulatory Visit (INDEPENDENT_AMBULATORY_CARE_PROVIDER_SITE_OTHER): Payer: Medicare Other | Admitting: Family Medicine

## 2017-07-20 VITALS — BP 133/76 | HR 86 | Wt 200.0 lb

## 2017-07-20 DIAGNOSIS — M5441 Lumbago with sciatica, right side: Secondary | ICD-10-CM

## 2017-07-20 DIAGNOSIS — M5440 Lumbago with sciatica, unspecified side: Secondary | ICD-10-CM

## 2017-07-20 DIAGNOSIS — M5442 Lumbago with sciatica, left side: Principal | ICD-10-CM

## 2017-07-20 DIAGNOSIS — R531 Weakness: Secondary | ICD-10-CM | POA: Diagnosis not present

## 2017-07-20 DIAGNOSIS — M545 Low back pain: Secondary | ICD-10-CM | POA: Diagnosis not present

## 2017-07-20 NOTE — Patient Instructions (Addendum)
Thank you for coming in today. You should hear about the injection CT scan and the EMG.  Recheck with me Monday or Tuesday.  Return sooner if needed.   Come back or go to the emergency room if you notice new weakness new numbness problems walking or bowel or bladder problems.   Sciatica Sciatica is pain, numbness, weakness, or tingling along your sciatic nerve. The sciatic nerve starts in the lower back and goes down the back of each leg. Sciatica happens when this nerve is pinched or has pressure put on it. Sciatica usually goes away on its own or with treatment. Sometimes, sciatica may keep coming back (recur). Follow these instructions at home: Medicines  Take over-the-counter and prescription medicines only as told by your doctor.  Do not drive or use heavy machinery while taking prescription pain medicine. Managing pain  If directed, put ice on the affected area. ? Put ice in a plastic bag. ? Place a towel between your skin and the bag. ? Leave the ice on for 20 minutes, 2-3 times a day.  After icing, apply heat to the affected area before you exercise or as often as told by your doctor. Use the heat source that your doctor tells you to use, such as a moist heat pack or a heating pad. ? Place a towel between your skin and the heat source. ? Leave the heat on for 20-30 minutes. ? Remove the heat if your skin turns bright red. This is especially important if you are unable to feel pain, heat, or cold. You may have a greater risk of getting burned. Activity  Return to your normal activities as told by your doctor. Ask your doctor what activities are safe for you. ? Avoid activities that make your sciatica worse.  Take short rests during the day. Rest in a lying or standing position. This is usually better than sitting to rest. ? When you rest for a long time, do some physical activity or stretching between periods of rest. ? Avoid sitting for a long time without moving. Get up and  move around at least one time each hour.  Exercise and stretch regularly, as told by your doctor.  Do not lift anything that is heavier than 10 lb (4.5 kg) while you have symptoms of sciatica. ? Avoid lifting heavy things even when you do not have symptoms. ? Avoid lifting heavy things over and over.  When you lift objects, always lift in a way that is safe for your body. To do this, you should: ? Bend your knees. ? Keep the object close to your body. ? Avoid twisting. General instructions  Use good posture. ? Avoid leaning forward when you are sitting. ? Avoid hunching over when you are standing.  Stay at a healthy weight.  Wear comfortable shoes that support your feet. Avoid wearing high heels.  Avoid sleeping on a mattress that is too soft or too hard. You might have less pain if you sleep on a mattress that is firm enough to support your back.  Keep all follow-up visits as told by your doctor. This is important. Contact a doctor if:  You have pain that: ? Wakes you up when you are sleeping. ? Gets worse when you lie down. ? Is worse than the pain you have had in the past. ? Lasts longer than 4 weeks.  You lose weight for without trying. Get help right away if:  You cannot control when you pee (urinate) or  poop (have a bowel movement).  You have weakness in any of these areas and it gets worse. ? Lower back. ? Lower belly (pelvis). ? Butt (buttocks). ? Legs.  You have redness or swelling of your back.  You have a burning feeling when you pee. This information is not intended to replace advice given to you by your health care provider. Make sure you discuss any questions you have with your health care provider. Document Released: 10/06/2007 Document Revised: 06/04/2015 Document Reviewed: 09/05/2014 Elsevier Interactive Patient Education  2018 [employer].  Duloxetine delayed-release capsules What is this medicine? DULOXETINE (doo LOX e teen) is used to treat  depression, anxiety, and different types of chronic pain. This medicine may be used for other purposes; ask your health care provider or pharmacist if you have questions. COMMON BRAND NAME(S): Cymbalta, Irenka What should I tell my health care provider before I take this medicine? They need to know if you have any of these conditions: -bipolar disorder or a family history of bipolar disorder -glaucoma -kidney disease -liver disease -suicidal thoughts or a previous suicide attempt -taken medicines called MAOIs like Carbex, Eldepryl, Marplan, Nardil, and Parnate within 14 days -an unusual reaction to duloxetine, other medicines, foods, dyes, or preservatives -pregnant or trying to get pregnant -breast-feeding How should I use this medicine? Take this medicine by mouth with a glass of water. Follow the directions on the prescription label. Do not cut, crush or chew this medicine. You can take this medicine with or without food. Take your medicine at regular intervals. Do not take your medicine more often than directed. Do not stop taking this medicine suddenly except upon the advice of your doctor. Stopping this medicine too quickly may cause serious side effects or your condition may worsen. A special MedGuide will be given to you by the pharmacist with each prescription and refill. Be sure to read this information carefully each time. Talk to your pediatrician regarding the use of this medicine in children. While this drug may be prescribed for children as young as 4 years of age for selected conditions, precautions do apply. Overdosage: If you think you have taken too much of this medicine contact a poison control center or emergency room at once. NOTE: This medicine is only for you. Do not share this medicine with others. What if I miss a dose? If you miss a dose, take it as soon as you can. If it is almost time for your next dose, take only that dose. Do not take double or extra doses. What  may interact with this medicine? Do not take this medicine with any of the following medications: -desvenlafaxine -levomilnacipran -linezolid -MAOIs like Carbex, Eldepryl, Marplan, Nardil, and Parnate -methylene blue (injected into a vein) -milnacipran -thioridazine -venlafaxine This medicine may also interact with the following medications: -alcohol -amphetamines -aspirin and aspirin-like medicines -certain antibiotics like ciprofloxacin and enoxacin -certain medicines for blood pressure, heart disease, irregular heart beat -certain medicines for depression, anxiety, or psychotic disturbances -certain medicines for migraine headache like almotriptan, eletriptan, frovatriptan, naratriptan, rizatriptan, sumatriptan, zolmitriptan -certain medicines that treat or prevent blood clots like warfarin, enoxaparin, and dalteparin -cimetidine -fentanyl -lithium -NSAIDS, medicines for pain and inflammation, like ibuprofen or naproxen -phentermine -procarbazine -rasagiline -sibutramine -St. John's wort -theophylline -tramadol -tryptophan This list may not describe all possible interactions. Give your health care provider a list of all the medicines, herbs, non-prescription drugs, or dietary supplements you use. Also tell them if you smoke, drink alcohol, or use  illegal drugs. Some items may interact with your medicine. What should I watch for while using this medicine? Tell your doctor if your symptoms do not get better or if they get worse. Visit your doctor or health care professional for regular checks on your progress. Because it may take several weeks to see the full effects of this medicine, it is important to continue your treatment as prescribed by your doctor. Patients and their families should watch out for new or worsening thoughts of suicide or depression. Also watch out for sudden changes in feelings such as feeling anxious, agitated, panicky, irritable, hostile, aggressive,  impulsive, severely restless, overly excited and hyperactive, or not being able to sleep. If this happens, especially at the beginning of treatment or after a change in dose, call your health care professional. Dennis Bast may get drowsy or dizzy. Do not drive, use machinery, or do anything that needs mental alertness until you know how this medicine affects you. Do not stand or sit up quickly, especially if you are an older patient. This reduces the risk of dizzy or fainting spells. Alcohol may interfere with the effect of this medicine. Avoid alcoholic drinks. This medicine can cause an increase in blood pressure. This medicine can also cause a sudden drop in your blood pressure, which may make you feel faint and increase the chance of a fall. These effects are most common when you first start the medicine or when the dose is increased, or during use of other medicines that can cause a sudden drop in blood pressure. Check with your doctor for instructions on monitoring your blood pressure while taking this medicine. Your mouth may get dry. Chewing sugarless gum or sucking hard candy, and drinking plenty of water may help. Contact your doctor if the problem does not go away or is severe. What side effects may I notice from receiving this medicine? Side effects that you should report to your doctor or health care professional as soon as possible: -allergic reactions like skin rash, itching or hives, swelling of the face, lips, or tongue -anxious -breathing problems -confusion -changes in vision -chest pain -confusion -elevated mood, decreased need for sleep, racing thoughts, impulsive behavior -eye pain -fast, irregular heartbeat -feeling faint or lightheaded, falls -feeling agitated, angry, or irritable -hallucination, loss of contact with reality -high blood pressure -loss of balance or coordination -palpitations -redness, blistering, peeling or loosening of the skin, including inside the  mouth -restlessness, pacing, inability to keep still -seizures -stiff muscles -suicidal thoughts or other mood changes -trouble passing urine or change in the amount of urine -trouble sleeping -unusual bleeding or bruising -unusually weak or tired -vomiting -yellowing of the eyes or skin Side effects that usually do not require medical attention (report to your doctor or health care professional if they continue or are bothersome): -change in sex drive or performance -change in appetite or weight -constipation -dizziness -dry mouth -headache -increased sweating -nausea -tired This list may not describe all possible side effects. Call your doctor for medical advice about side effects. You may report side effects to FDA at 1-800-FDA-1088. Where should I keep my medicine? Keep out of the reach of children. Store at room temperature between 20 and 25 degrees C (68 to 77 degrees F). Throw away any unused medicine after the expiration date. NOTE: This sheet is a summary. It may not cover all possible information. If you have questions about this medicine, talk to your doctor, pharmacist, or health care provider.  2018 Elsevier/Gold Standard (  2015-05-28 18:16:03)   

## 2017-07-21 NOTE — Progress Notes (Signed)
Carrie Salazar is a 72 y.o. female who presents to [facility] today for back and leg pain.  Carrie Salazar has been seen several times for back and leg pain.  She was seen about 3 weeks ago for back and leg pain with some radicular symptoms thought to be related to possible piriformis syndrome or trochanteric bursitis.  She notes her symptoms have been worsening recently.  She denies frank weakness but does note that her legs are giving way occasionally.  She does note continued numbness that she is had ongoing now for some time.  She denies prior bladder dysfunction or significant saddle anesthesia.  She takes gabapentin which does help but not sufficiently to control her pain.  She has a pertinent surgical history for multilevel fusion as well as a spinal stimulator.  Surgical notes are not available as her surgeries were done in [place].    ROS:  As above  Exam:  BP 133/76   Pulse 86   Wt 200 lb (90.7 kg)   BMI 33.28 kg/m  General: Well Developed, well nourished, and in no acute distress.  Neuro/Psych: Alert and oriented x3, extra-ocular muscles intact, able to move all 4 extremities, sensation grossly intact. Skin: Warm and dry, no rashes noted.  Respiratory: Not using accessory muscles, speaking in full sentences, trachea midline.  Cardiovascular: Pulses palpable, no extremity edema. Abdomen: Does not appear distended. MSK:  L-spine: Well-appearing midline scar.  Decreased lumbar motion. Lower extremity strength isolated extremity testing is intact however patient has an unsteady gait. Sensation decreased bilaterally lower extremities.  Pulses intact.    Lab and Radiology Results X-ray L-spine shows intact surgical hardware in the lumbar spine with an intact spinal stimulator.  No acute fractures or severe malalignment present. Images personally independently reviewed by me today. Awaiting formal radiology review.    Assessment and  Plan: 72 y.o. female with  Worsening lumbar radicular pain.  Patient is having worsening symptoms and now developing gait unsteadiness and possible early weakness.  I am concerned for worsening neuro impingement.  Plan for CT myelogram of the lumbar spine as well as nerve conduction study.  Recheck early next week for recheck strength testing.  Carefully reviewed warning signs or symptoms.  Continue tramadol and gabapentin for pain control.    Orders Placed This Encounter  Procedures  . DG Lumbar Spine Complete    Standing Status:   Future    Number of Occurrences:   1    Standing Expiration Date:   09/21/2018    Order Specific Question:   Reason for Exam (SYMPTOM  OR DIAGNOSIS REQUIRED)    Answer:   eval lumbar pain and BL S1 radiculopathy    Order Specific Question:   Preferred imaging location?    Answer:   [facility]    Order Specific Question:   Radiology Contrast Protocol - do NOT remove file path    Answer:   \\[email or web address]s.[email or web address]  . CT CERVICAL SPINE W CONTRAST    Standing Status:   Future    Standing Expiration Date:   10/21/2018    Order Specific Question:   ** REASON FOR EXAM (FREE TEXT)    Answer:   Concern BL S1 radiculopathy with new weakness    Order Specific Question:   If indicated for the ordered procedure, I authorize the administration of contrast media per Radiology protocol    Answer:   Yes    Order Specific Question:   Preferred imaging location?  Answer:   [facility]    Order Specific Question:   Radiology Contrast Protocol - do NOT remove file path    Answer:   \\[email or web address].pdf  . DG Myelogram Lumbar    Standing Status:   Future    Standing Expiration Date:   09/21/2018    Order Specific Question:   If indicated for the ordered procedure, I authorize the administration of contrast media per Radiology protocol    Answer:   Yes    Order Specific Question:   Reason for Exam  (SYMPTOM  OR DIAGNOSIS REQUIRED)    Answer:   CT meylogram L spine    Order Specific Question:   Preferred Imaging Location?    Answer:   [facility]    Order Specific Question:   Radiology Contrast Protocol - do NOT remove file path    Answer:   \\[email or web address]\[email or web address]s.pdf  . CBC  . COMPLETE METABOLIC PANEL WITH GFR  . TSH  . NCV with EMG(electromyography)    Standing Status:   Future    Standing Expiration Date:   07/21/2018   No orders of the defined types were placed in this encounter.   Historical information moved to improve visibility of documentation.  Past Medical History:  Diagnosis Date  . Chronic fatigue   . Depression   . Fatigue 08/16/2016  . Fibromyalgia   . Heart attack ([place])   . Hx of CABG   . Hyperlipidemia   . Hypertension   . Migraine    Past Surgical History:  Procedure Laterality Date  . ABDOMINAL HYSTERECTOMY    . APPENDECTOMY    . BACK SURGERY    . BACK SURGERY     Lumbar rods  . BLADDER SURGERY    . BREAST BIOPSY Left    x 2  . BREAST EXCISIONAL BIOPSY Left    x 2  . BREAST LUMPECTOMY     left breast all benign (x2)  . CARPAL TUNNEL RELEASE Bilateral   . CORONARY ARTERY BYPASS GRAFT     Triple  . LYMPECTOMY  1980  . TONSILLECTOMY    . VAGINA SURGERY    . WRIST SURGERY     Social History   Tobacco Use  . Smoking status: Former Smoker    Last attempt to quit: 2003    Years since quitting: 16.5  . Smokeless tobacco: Never Used  Substance Use Topics  . Alcohol use: Yes    Comment: 1 q wk   family history includes Cancer in her mother; Heart attack in her father; Hemophilia in her maternal grandfather; Hyperlipidemia in her father and maternal grandmother; Hypertension in her father and maternal grandmother; Stroke in her father and maternal grandmother.  Medications: Current Outpatient Medications  Medication Sig Dispense Refill  . aspirin EC 325 MG tablet Take 1 tablet (325 mg total) by mouth  daily. 30 tablet 0  . carvedilol (COREG) 12.5 MG tablet Take 6 mg by mouth 2 (two) times daily.   0  . cetirizine (ZYRTEC) 5 MG tablet Take 1 tablet (5 mg total) by mouth daily. 30 tablet 0  . Cholecalciferol (VITAMIN D3) 5000 units CAPS Take 1 capsule by mouth daily.    Marland Kitchen estradiol (ESTRACE) 0.1 MG/GM vaginal cream INSERT 1/2 TO 1 APPLICATORFUL VAGINALLY 3 TIMES A WEEK 42.5 g 0  . FLUoxetine (PROZAC) 40 MG capsule TAKE ONE CAPSULE BY MOUTH DAILY 90 capsule 0  . gabapentin (NEURONTIN) 600 MG tablet TAKE 1 TABLET( 600 MG  TOTAL) BY MOUTH IN THE MORNING AND AFTERNOON. IN EVENING CAN TAKE 2 TABLETS( 1200 MG TOTAL) 120 tablet 1  . ipratropium (ATROVENT HFA) 17 MCG/ACT inhaler Inhale 2 puffs into the lungs every 4 (four) hours as needed for wheezing. 12.9 g 3  . lisinopril (PRINIVIL,ZESTRIL) 40 MG tablet Take 1 tablet (40 mg total) by mouth 2 (two) times daily. 90 tablet 3  . nitroGLYCERIN (NITROSTAT) 0.4 MG SL tablet Place 1 tablet (0.4 mg total) under the tongue every 5 (five) minutes as needed for chest pain. 25 tablet 12  . ondansetron (ZOFRAN-ODT) 8 MG disintegrating tablet Take 1 tablet (8 mg total) by mouth every 8 (eight) hours as needed for nausea. 20 tablet 3  . ranolazine (RANEXA) 500 MG 12 hr tablet TAKE 1 TABLET BY MOUTH TWO  TIMES DAILY 180 tablet 3  . traMADol (ULTRAM) 50 MG tablet Take 2 tablets (100 mg total) by mouth 3 (three) times daily. As needed for pain. 180 tablet 0  . diphenhydrAMINE (BENADRYL) 50 MG tablet Take 1 tablet (50 mg total) by mouth once for 1 dose. 1 hour prior to contrast administration 1 tablet 0   No current facility-administered medications for this visit.    Allergies  Allergen Reactions  . Albuterol Palpitations and Other (See Comments)    HALLUCINATIONS Also feels "funny in the head"  . Dilaudid [Hydromorphone] Anaphylaxis    Caused patient to have heart attack and crazy in the head  . Ivp Dye [Iodinated Diagnostic Agents] Other (See Comments)    Itching  and causes me to pass out  . Metrizamide Other (See Comments), Itching, Rash and Hives    Itching and causes me to pass out  . Percocet [Oxycodone-Acetaminophen] Itching  . Sulfa Antibiotics Hives and Other (See Comments)    Itching and causes me to pass out**PT CAN TAKE BENADRYL 2 HOURS PRIOR AND IS OK**   . Tape Itching  . Levaquin [Levofloxacin In D5w] Other (See Comments)    "I get real dizzy and pass out"    . Codeine Itching and Hives  . Hydromorphone Hcl   . Lanolin Hives    "eats skin off"  . Latex Other (See Comments)    LEAVES REALLY BAD BURN ON SKIN   . Percodan [Oxycodone-Aspirin]   . Sulfasalazine Hives  . Nickel Rash  . Penicillins Hives  . Sulfamethoxazole Rash and Hives      Discussed warning signs or symptoms. Please see discharge instructions. Patient expresses understanding.

## 2017-07-24 ENCOUNTER — Ambulatory Visit: Payer: Medicare Other | Admitting: Family Medicine

## 2017-07-25 DIAGNOSIS — I25810 Atherosclerosis of coronary artery bypass graft(s) without angina pectoris: Secondary | ICD-10-CM | POA: Diagnosis not present

## 2017-07-25 DIAGNOSIS — K623 Rectal prolapse: Secondary | ICD-10-CM | POA: Diagnosis not present

## 2017-07-25 DIAGNOSIS — M5441 Lumbago with sciatica, right side: Secondary | ICD-10-CM | POA: Diagnosis not present

## 2017-07-25 DIAGNOSIS — M5442 Lumbago with sciatica, left side: Secondary | ICD-10-CM | POA: Diagnosis not present

## 2017-07-25 DIAGNOSIS — R531 Weakness: Secondary | ICD-10-CM | POA: Diagnosis not present

## 2017-07-26 LAB — CBC
HCT: 38.7 % (ref 35.0–45.0)
Hemoglobin: 12.8 g/dL (ref 11.7–15.5)
MCH: 30.6 pg (ref 27.0–33.0)
MCHC: 33.1 g/dL (ref 32.0–36.0)
MCV: 92.6 fL (ref 80.0–100.0)
MPV: 9.1 fL (ref 7.5–12.5)
PLATELETS: 273 10*3/uL (ref 140–400)
RBC: 4.18 10*6/uL (ref 3.80–5.10)
RDW: 13.5 % (ref 11.0–15.0)
WBC: 6.6 10*3/uL (ref 3.8–10.8)

## 2017-07-26 LAB — LIPID PANEL
CHOL/HDL RATIO: 5.5 (calc) — AB (ref <5.0)
CHOLESTEROL: 243 mg/dL — AB (ref <200)
HDL: 44 mg/dL — ABNORMAL LOW (ref >50)
Non-HDL Cholesterol (Calc): 199 mg/dL (calc) — ABNORMAL HIGH (ref <130)
Triglycerides: 423 mg/dL — ABNORMAL HIGH (ref <150)

## 2017-07-26 LAB — COMPLETE METABOLIC PANEL WITH GFR
AG RATIO: 1.7 (calc) (ref 1.0–2.5)
ALT: 19 U/L (ref 6–29)
AST: 17 U/L (ref 10–35)
Albumin: 4 g/dL (ref 3.6–5.1)
Alkaline phosphatase (APISO): 62 U/L (ref 33–130)
BUN/Creatinine Ratio: 20 (calc) (ref 6–22)
BUN: 22 mg/dL (ref 7–25)
CO2: 28 mmol/L (ref 20–32)
Calcium: 9.4 mg/dL (ref 8.6–10.4)
Chloride: 103 mmol/L (ref 98–110)
Creat: 1.11 mg/dL — ABNORMAL HIGH (ref 0.60–0.93)
GFR, EST AFRICAN AMERICAN: 57 mL/min/{1.73_m2} — AB (ref >=60)
GFR, Est Non African American: 49 mL/min/{1.73_m2} — ABNORMAL LOW (ref >=60)
Globulin: 2.4 g/dL (calc) (ref 1.9–3.7)
Glucose, Bld: 79 mg/dL (ref 65–139)
POTASSIUM: 4.3 mmol/L (ref 3.5–5.3)
Sodium: 139 mmol/L (ref 135–146)
Total Bilirubin: 0.2 mg/dL (ref 0.2–1.2)
Total Protein: 6.4 g/dL (ref 6.1–8.1)

## 2017-07-26 LAB — TSH: TSH: 1.74 m[IU]/L (ref 0.40–4.50)

## 2017-07-27 ENCOUNTER — Ambulatory Visit (INDEPENDENT_AMBULATORY_CARE_PROVIDER_SITE_OTHER): Payer: Medicare Other | Admitting: Family Medicine

## 2017-07-27 ENCOUNTER — Encounter: Payer: Self-pay | Admitting: Family Medicine

## 2017-07-27 VITALS — BP 145/54 | HR 76 | Temp 98.7°F | Wt 204.6 lb

## 2017-07-27 DIAGNOSIS — M5442 Lumbago with sciatica, left side: Secondary | ICD-10-CM

## 2017-07-27 DIAGNOSIS — M5441 Lumbago with sciatica, right side: Secondary | ICD-10-CM | POA: Diagnosis not present

## 2017-07-27 DIAGNOSIS — G5793 Unspecified mononeuropathy of bilateral lower limbs: Secondary | ICD-10-CM | POA: Diagnosis not present

## 2017-07-27 NOTE — Patient Instructions (Signed)
Thank you for coming in today. Recheck after CT scan.  Come back or go to the emergency room if you notice new weakness new numbness problems walking or bowel or bladder problems.

## 2017-07-28 ENCOUNTER — Telehealth: Payer: Self-pay | Admitting: Family Medicine

## 2017-07-28 NOTE — Telephone Encounter (Signed)
Cervical order cancelled.

## 2017-07-28 NOTE — Telephone Encounter (Signed)
-----   Message from Gregor Hams, MD sent at 07/28/2017  4:36 AM EDT ----- Regarding: Ct myelogram I tried to order a CT myelogram of the lumbar spine for Ms Corcoran at the last visit but accidentally ordered a CT myelogram of the cervical spine.  I ordered the lumbar spine myelogram.  Can you please call the radiology department and cancel the cervical spine?  Thanks, Ellard Artis

## 2017-07-28 NOTE — Progress Notes (Signed)
Carrie Salazar is a 72 y.o. female who presents to [facility] today for back pain with bilateral radiculopathy.  Carrie Salazar was seen on July 11 for worsening low back pain with bilateral lower leg radiating pain.  She has a pertinent history for significant low back surgery.  X-rays at that time showed intact surgical hardware without acute change.  A CT myelogram was ordered but not yet completed.  She is here today for safety recheck.  She notes that the pain has improved slightly and not progressed.  She denies any worsening weakness or numbness or bowel bladder dysfunction.  She feels reasonably well with no fevers or chills.    ROS:  As above  Exam:  BP (!) 145/54 (BP Location: Left Arm, Patient Position: Sitting, Cuff Size: Large)   Pulse 76   Temp 98.7 F (37.1 C) (Oral)   Wt 204 lb 9.6 oz (92.8 kg)   BMI 34.05 kg/m  General: Well Developed, well nourished, and in no acute distress.  Neuro/Psych: Alert and oriented x3, extra-ocular muscles intact, able to move all 4 extremities, sensation grossly intact. Skin: Warm and dry, no rashes noted.  Respiratory: Not using accessory muscles, speaking in full sentences, trachea midline.  Cardiovascular: Pulses palpable, no extremity edema. Abdomen: Does not appear distended. MSK:  Lower extremity strength is intact.  Mild antalgic gait. Back range of motion limited.  EXAM: LUMBAR SPINE - COMPLETE 4+ VIEW  COMPARISON:  None  FINDINGS: Pedicle screws are seen from L2 through L5 with disc spacer devices and fusion seen at the same levels. No fractures mild malalignment. Facet degenerative changes noted. Minimal degenerative disc disease. A spinal stimulator. A small left renal stone is noted. Atherosclerotic changes are seen in the abdominal aorta.  IMPRESSION: 1. Pedicle screws, disc spacer devices, infusion as above. Degenerative changes.   Electronically Signed   By: Dorise Bullion III M.D   On: 07/21/2017 08:13 I personally (independently) visualized and performed the interpretation of the images attached in this note.     Assessment and Plan: 72 y.o. female with  Lumbar pain and lumbar radiculopathy.  Stable and not worsening but quite significant.  Plan for CT myelogram lumbar spine.  Recheck after myelogram to discuss potential injection planning or other options.  I spent 15 minutes with this patient, greater than 50% was face-to-face time counseling regarding ddx and plan.     Orders Placed This Encounter  Procedures  . CT Lumbar Spine W Contrast    Standing Status:   Future    Standing Expiration Date:   10/28/2018    Order Specific Question:   ** REASON FOR EXAM (FREE TEXT)    Answer:   Lumbar rad CT Myelogram    Order Specific Question:   If indicated for the ordered procedure, I authorize the administration of contrast media per Radiology protocol    Answer:   Yes    Order Specific Question:   Preferred imaging location?    Answer:   [id number]    Order Specific Question:   Radiology Contrast Protocol - do NOT remove file path    Answer:   [email or web address]   No orders of the defined types were placed in this encounter.   Historical information moved to improve visibility of documentation.  Past Medical History:  Diagnosis Date  . Chronic fatigue   . Depression   . Fatigue 08/16/2016  . Fibromyalgia   . Heart attack ([place])   .  Hx of CABG   . Hyperlipidemia   . Hypertension   . Migraine    Past Surgical History:  Procedure Laterality Date  . ABDOMINAL HYSTERECTOMY    . APPENDECTOMY    . BACK SURGERY    . BACK SURGERY     Lumbar rods  . BLADDER SURGERY    . BREAST BIOPSY Left    x 2  . BREAST EXCISIONAL BIOPSY Left    x 2  . BREAST LUMPECTOMY     left breast all benign (x2)  . CARPAL TUNNEL RELEASE Bilateral   . CORONARY ARTERY BYPASS GRAFT     Triple  . LYMPECTOMY  1980  .  TONSILLECTOMY    . VAGINA SURGERY    . WRIST SURGERY     Social History   Tobacco Use  . Smoking status: Former Smoker    Last attempt to quit: 2003    Years since quitting: 16.5  . Smokeless tobacco: Never Used  Substance Use Topics  . Alcohol use: Yes    Comment: 1 q wk   family history includes Cancer in her mother; Heart attack in her father; Hemophilia in her maternal grandfather; Hyperlipidemia in her father and maternal grandmother; Hypertension in her father and maternal grandmother; Stroke in her father and maternal grandmother.  Medications: Current Outpatient Medications  Medication Sig Dispense Refill  . aspirin EC 325 MG tablet Take 1 tablet (325 mg total) by mouth daily. 30 tablet 0  . carvedilol (COREG) 12.5 MG tablet Take 6 mg by mouth 2 (two) times daily.   0  . cetirizine (ZYRTEC) 5 MG tablet Take 1 tablet (5 mg total) by mouth daily. 30 tablet 0  . Cholecalciferol (VITAMIN D3) 5000 units CAPS Take 1 capsule by mouth daily.    Marland Kitchen estradiol (ESTRACE) 0.1 MG/GM vaginal cream INSERT 1/2 TO 1 APPLICATORFUL VAGINALLY 3 TIMES A WEEK 42.5 g 0  . FLUoxetine (PROZAC) 40 MG capsule TAKE ONE CAPSULE BY MOUTH DAILY 90 capsule 0  . gabapentin (NEURONTIN) 600 MG tablet TAKE 1 TABLET( 600 MG TOTAL) BY MOUTH IN THE MORNING AND AFTERNOON. IN EVENING CAN TAKE 2 TABLETS( 1200 MG TOTAL) 120 tablet 1  . ipratropium (ATROVENT HFA) 17 MCG/ACT inhaler Inhale 2 puffs into the lungs every 4 (four) hours as needed for wheezing. 12.9 g 3  . lisinopril (PRINIVIL,ZESTRIL) 40 MG tablet Take 1 tablet (40 mg total) by mouth 2 (two) times daily. 90 tablet 3  . nitroGLYCERIN (NITROSTAT) 0.4 MG SL tablet Place 1 tablet (0.4 mg total) under the tongue every 5 (five) minutes as needed for chest pain. 25 tablet 12  . ondansetron (ZOFRAN-ODT) 8 MG disintegrating tablet Take 1 tablet (8 mg total) by mouth every 8 (eight) hours as needed for nausea. 20 tablet 3  . ranolazine (RANEXA) 500 MG 12 hr tablet TAKE 1  TABLET BY MOUTH TWO  TIMES DAILY 180 tablet 3  . traMADol (ULTRAM) 50 MG tablet Take 2 tablets (100 mg total) by mouth 3 (three) times daily. As needed for pain. 180 tablet 0  . diphenhydrAMINE (BENADRYL) 50 MG tablet Take 1 tablet (50 mg total) by mouth once for 1 dose. 1 hour prior to contrast administration 1 tablet 0   No current facility-administered medications for this visit.    Allergies  Allergen Reactions  . Albuterol Palpitations and Other (See Comments)    HALLUCINATIONS Also feels "funny in the head"  . Dilaudid [Hydromorphone] Anaphylaxis    Caused patient to  have heart attack and crazy in the head  . Ivp Dye [Iodinated Diagnostic Agents] Other (See Comments)    Itching and causes me to pass out  . Metrizamide Other (See Comments), Itching, Rash and Hives    Itching and causes me to pass out  . Percocet [Oxycodone-Acetaminophen] Itching  . Sulfa Antibiotics Hives and Other (See Comments)    Itching and causes me to pass out**PT CAN TAKE BENADRYL 2 HOURS PRIOR AND IS OK**   . Tape Itching  . Levaquin [Levofloxacin In D5w] Other (See Comments)    "I get real dizzy and pass out"    . Codeine Itching and Hives  . Hydromorphone Hcl   . Lanolin Hives    "eats skin off"  . Latex Other (See Comments)    LEAVES REALLY BAD BURN ON SKIN   . Percodan [Oxycodone-Aspirin]   . Sulfasalazine Hives  . Nickel Rash  . Penicillins Hives  . Sulfamethoxazole Rash and Hives      Discussed warning signs or symptoms. Please see discharge instructions. Patient expresses understanding.

## 2017-08-08 ENCOUNTER — Telehealth: Payer: Self-pay | Admitting: Nurse Practitioner

## 2017-08-08 ENCOUNTER — Telehealth: Payer: Self-pay

## 2017-08-08 DIAGNOSIS — H35371 Puckering of macula, right eye: Secondary | ICD-10-CM | POA: Insufficient documentation

## 2017-08-08 DIAGNOSIS — H3561 Retinal hemorrhage, right eye: Secondary | ICD-10-CM | POA: Insufficient documentation

## 2017-08-08 DIAGNOSIS — H524 Presbyopia: Secondary | ICD-10-CM | POA: Insufficient documentation

## 2017-08-08 MED ORDER — TRAMADOL HCL 50 MG PO TABS: 100.0000 mg | ORAL_TABLET | Freq: Three times a day (TID) | ORAL | 0 refills | Status: DC

## 2017-08-08 NOTE — Telephone Encounter (Signed)
Sent!

## 2017-08-08 NOTE — Telephone Encounter (Signed)
Pt called requesting med RF for tramadol. Pt is due to return for 3 mths f/u around 09/06/17 for refills on pain medications, unless she establish first with pain mgmt. Pls advise. Thanks.

## 2017-08-08 NOTE — Telephone Encounter (Signed)
Phone call to patient to verify medication list and allergies for myelogram procedure. Pt instructed to stop prozac and tramadol 48hrs prior to myelogram appointment time. Pt verbalized understanding.

## 2017-08-09 NOTE — Telephone Encounter (Signed)
Left VM for Pt with status update.  

## 2017-08-21 ENCOUNTER — Ambulatory Visit
Admission: RE | Admit: 2017-08-21 | Discharge: 2017-08-21 | Disposition: A | Discharge status: Home / Self Care | Payer: Medicare Other | Source: Ambulatory Visit | Attending: Family Medicine | Admitting: Family Medicine

## 2017-08-21 DIAGNOSIS — M5126 Other intervertebral disc displacement, lumbar region: Secondary | ICD-10-CM | POA: Diagnosis not present

## 2017-08-21 DIAGNOSIS — M5441 Lumbago with sciatica, right side: Secondary | ICD-10-CM

## 2017-08-21 DIAGNOSIS — G5793 Unspecified mononeuropathy of bilateral lower limbs: Secondary | ICD-10-CM

## 2017-08-21 DIAGNOSIS — M5442 Lumbago with sciatica, left side: Principal | ICD-10-CM

## 2017-08-21 MED ORDER — DIAZEPAM 5 MG PO TABS
5.0000 mg | ORAL_TABLET | Freq: Once | ORAL | Status: AC
  Administered 2017-08-21: 5 mg via ORAL

## 2017-08-21 MED ORDER — IOPAMIDOL (ISOVUE-M 200) INJECTION 41%
20.0000 mL | Freq: Once | INTRAMUSCULAR | Status: AC
  Administered 2017-08-21: 20 mL via INTRATHECAL

## 2017-08-21 MED ORDER — DIPHENHYDRAMINE HCL 25 MG PO CAPS
25.0000 mg | ORAL_CAPSULE | Freq: Once | ORAL | Status: AC
  Administered 2017-08-21: 25 mg via ORAL

## 2017-08-21 NOTE — Discharge Instructions (Signed)
Myelogram Discharge Instructions  1. Go home and rest quietly for the next 24 hours.  It is important to lie flat for the next 24 hours.  Get up only to go to the restroom.  You may lie in the bed or on a couch on your back, your stomach, your left side or your right side.  You may have one pillow under your head.  You may have pillows between your knees while you are on your side or under your knees while you are on your back.  2. DO NOT drive today.  Recline the seat as far back as it will go, while still wearing your seat belt, on the way home.  3. You may get up to go to the bathroom as needed.  You may sit up for 10 minutes to eat.  You may resume your normal diet and medications unless otherwise indicated.  Drink lots of extra fluids today and tomorrow.  4. The incidence of headache, nausea, or vomiting is about 5% (one in 20 patients).  If you develop a headache, lie flat and drink plenty of fluids until the headache goes away.  Caffeinated beverages may be helpful.  If you develop severe nausea and vomiting or a headache that does not go away with flat bed rest, call [phone].  5. You may resume normal activities after your 24 hours of bed rest is over; however, do not exert yourself strongly or do any heavy lifting tomorrow. If when you get up you have a headache when standing, go back to bed and force fluids for another 24 hours.  6. Call your physician for a follow-up appointment.  The results of your myelogram will be sent directly to your physician by the following day.  7. If you have any questions or if complications develop after you arrive home, please call [phone].  Discharge instructions have been explained to the patient.  The patient, or the person responsible for the patient, fully understands these instructions.  YOU MAY RESTART YOUR ASPIRIN TODAY. YOU MAY RESTART YOUR PROZAC AND TRAMADOL TOMORROW 08/22/2017 AT 1:00PM.

## 2017-08-29 ENCOUNTER — Telehealth: Payer: Self-pay | Admitting: Family Medicine

## 2017-08-29 DIAGNOSIS — M5441 Lumbago with sciatica, right side: Secondary | ICD-10-CM

## 2017-08-29 DIAGNOSIS — M5442 Lumbago with sciatica, left side: Secondary | ICD-10-CM

## 2017-08-29 DIAGNOSIS — G5793 Unspecified mononeuropathy of bilateral lower limbs: Secondary | ICD-10-CM

## 2017-08-29 NOTE — Telephone Encounter (Signed)
-----   Message from Carrie Salazar, [place] sent at 08/28/2017 12:41 PM EDT ----- Spoke to patient gave her results as noted below. Patient stated that she still hurts in her legs. Advised that order for epidural will be placed. Rhonda Cunningham,CMA

## 2017-08-29 NOTE — Telephone Encounter (Signed)
Left VM for Roberta with GI advising of order. She will contact Pt.

## 2017-08-29 NOTE — Telephone Encounter (Signed)
Epidural steroid injection ordered.  Will notify [facility] imaging and the patient and schedule.

## 2017-09-04 ENCOUNTER — Ambulatory Visit: Payer: Medicare Other | Admitting: Osteopathic Medicine

## 2017-09-13 ENCOUNTER — Other Ambulatory Visit: Payer: Self-pay | Admitting: Family Medicine

## 2017-09-13 DIAGNOSIS — G5793 Unspecified mononeuropathy of bilateral lower limbs: Secondary | ICD-10-CM

## 2017-09-13 NOTE — Telephone Encounter (Signed)
Requesting RF on gabapentin 600mg , 1 tab QAM and QPM. Can take 2 in PM if needed  Last RX written 07-17-17 with 1 RF   RX pended, please review and send if appropriate  Thanks!

## 2017-09-14 ENCOUNTER — Ambulatory Visit: Payer: Medicare Other | Admitting: Family Medicine

## 2017-09-18 ENCOUNTER — Ambulatory Visit
Admission: RE | Admit: 2017-09-18 | Discharge: 2017-09-18 | Disposition: A | Discharge status: Home / Self Care | Payer: Medicare Other | Source: Ambulatory Visit | Attending: Family Medicine | Admitting: Family Medicine

## 2017-09-18 DIAGNOSIS — M5126 Other intervertebral disc displacement, lumbar region: Secondary | ICD-10-CM | POA: Diagnosis not present

## 2017-09-18 MED ORDER — METHYLPREDNISOLONE ACETATE 40 MG/ML INJ SUSP (RADIOLOG
120.0000 mg | Freq: Once | INTRAMUSCULAR | Status: AC
  Administered 2017-09-18: 120 mg via EPIDURAL

## 2017-09-18 MED ORDER — IOPAMIDOL (ISOVUE-M 200) INJECTION 41%
1.0000 mL | Freq: Once | INTRAMUSCULAR | Status: AC
  Administered 2017-09-18: 1 mL via EPIDURAL

## 2017-09-18 NOTE — Discharge Instructions (Signed)

## 2017-09-20 NOTE — Progress Notes (Signed)
Subjective:   Carrie Salazar is a 72 y.o. female who presents for Medicare Annual (Subsequent) preventive examination.  Review of Systems:  No ROS.  Medicare Wellness Visit. Additional risk factors are reflected in the social history.  Cardiac Risk Factors include: dyslipidemia;advanced age (>74men, >25 women);hypertension;sedentary lifestyle Sleep patterns: Sleeps around 9 hours a night. Wakes up sometimes to urinate but goes right back to sleep.  Home Safety/Smoke Alarms: Feels safe in home. Smoke alarms in place.  Living environment; Lives in home with husband and dog. No stairs into house. Has step over shower with hand rails in place.   Female:   Pap-  Aged out     Mammo- done 03/30/2017       Dexa scan- pt declines right now       CCS-pt declines right now     Objective:     Vitals: There were no vitals taken for this visit.  There is no height or weight on file to calculate BMI.  Advanced Directives 09/25/2017 02/29/2016 09/24/2015 08/13/2015  Does Patient Have a Medical Advance Directive? No No No No;Yes  Type of Advance Directive - - - Living will  Does patient want to make changes to medical advance directive? - - - No - Patient declined  Copy of [place] in Chart? - - - No - copy requested  Would patient like information on creating a medical advance directive? Yes (MAU/Ambulatory/Procedural Areas - Information given) No - Patient declined No - patient declined information No - patient declined information    Tobacco Social History   Tobacco Use  Smoking Status Former Smoker  . Last attempt to quit: 2003  . Years since quitting: 16.7  Smokeless Tobacco Never Used     Counseling given: Not Answered   Clinical Intake:  Pre-visit preparation completed: Yes  Pain : No/denies pain     Diabetes: No  How often do you need to have someone help you when you read instructions, pamphlets, or other written materials from your doctor or pharmacy?:  1 - Never What is the last grade level you completed in school?: college 1 year  Interpreter Needed?: No     Past Medical History:  Diagnosis Date  . Chronic fatigue   . Depression   . Fatigue 08/16/2016  . Fibromyalgia   . Heart attack ([place])   . Hx of CABG   . Hyperlipidemia   . Hypertension   . Migraine    Past Surgical History:  Procedure Laterality Date  . ABDOMINAL HYSTERECTOMY    . APPENDECTOMY    . BACK SURGERY    . BACK SURGERY     Lumbar rods  . BLADDER SURGERY    . BREAST BIOPSY Left    x 2  . BREAST EXCISIONAL BIOPSY Left    x 2  . BREAST LUMPECTOMY     left breast all benign (x2)  . CARPAL TUNNEL RELEASE Bilateral   . CORONARY ARTERY BYPASS GRAFT     Triple  . LYMPECTOMY  1980  . TONSILLECTOMY    . VAGINA SURGERY    . WRIST SURGERY     Family History  Problem Relation Age of Onset  . Cancer Mother   . Hyperlipidemia Father   . Hypertension Father   . Stroke Father   . Heart attack Father   . Hyperlipidemia Maternal Grandmother   . Hypertension Maternal Grandmother   . Stroke Maternal Grandmother   . Hemophilia Maternal Grandfather  Social History   Socioeconomic History  . Marital status: Married    Spouse name: Not on file  . Number of children: 2  . Years of education: 30  . Highest education level: 12th grade  Occupational History  . Occupation: Retired    Comment: Water quality scientist  . Financial resource strain: Not very hard  . Food insecurity:    Worry: Never true    Inability: Never true  . Transportation needs:    Medical: No    Non-medical: No  Tobacco Use  . Smoking status: Former Smoker    Last attempt to quit: 2003    Years since quitting: 16.7  . Smokeless tobacco: Never Used  Substance and Sexual Activity  . Alcohol use: Yes    Comment: 1 q wk  . Drug use: No  . Sexual activity: Not Currently    Partners: Male    Comment: Married  Lifestyle  . Physical activity:    Days per week: 0 days    Minutes  per session: 0 min  . Stress: Only a little  Relationships  . Social connections:    Talks on phone: More than three times a week    Gets together: Twice a week    Attends religious service: 1 to 4 times per year    Active member of club or organization: No    Attends meetings of clubs or organizations: Never    Relationship status: Married  Other Topics Concern  . Not on file  Social History Narrative   Lives at home w/ her husband   Right-handed   Caffeine: 2 cups per day   Going to church sometimes with daughter. Adjusting to the move and being in new place.    Outpatient Encounter Medications as of 09/25/2017  Medication Sig  . ALPRAZolam (XANAX) 0.5 MG tablet Take 1 tablet (0.5 mg total) by mouth at bedtime as needed for anxiety or sleep. Every night for one week, then 1-2 times per week as needed  . aspirin EC 325 MG tablet Take 1 tablet (325 mg total) by mouth daily.  . cetirizine (ZYRTEC) 5 MG tablet Take 1 tablet (5 mg total) by mouth daily.  . Cholecalciferol (VITAMIN D3) 5000 units CAPS Take 1 capsule by mouth daily.  Marland Kitchen estradiol (ESTRACE) 0.1 MG/GM vaginal cream INSERT 1/2 TO 1 APPLICATORFUL VAGINALLY 3 TIMES A WEEK  . gabapentin (NEURONTIN) 600 MG tablet TAKE 1 TABLET BY MOUTH IN THE MORNING, 1 TABLET IN THE AFTERNOON, AND 2 TABLETS IN THE EVENING( 1200 MG TOTAL PER DAY)  . ipratropium (ATROVENT HFA) 17 MCG/ACT inhaler Inhale 2 puffs into the lungs every 4 (four) hours as needed for wheezing.  Marland Kitchen lisinopril (PRINIVIL,ZESTRIL) 40 MG tablet Take 1 tablet (40 mg total) by mouth 2 (two) times daily.  . nitroGLYCERIN (NITROSTAT) 0.4 MG SL tablet Place 1 tablet (0.4 mg total) under the tongue every 5 (five) minutes as needed for chest pain.  . ranolazine (RANEXA) 500 MG 12 hr tablet TAKE 1 TABLET BY MOUTH TWO  TIMES DAILY  . carvedilol (COREG) 12.5 MG tablet Take 6 mg by mouth 2 (two) times daily.   . diphenhydrAMINE (BENADRYL) 50 MG tablet Take 1 tablet (50 mg total) by mouth  once for 1 dose. 1 hour prior to contrast administration  . FLUoxetine (PROZAC) 40 MG capsule TAKE ONE CAPSULE BY MOUTH DAILY (Patient not taking: Reported on 09/21/2017)  . ondansetron (ZOFRAN-ODT) 8 MG disintegrating tablet Take 1 tablet (  8 mg total) by mouth every 8 (eight) hours as needed for nausea. (Patient not taking: Reported on 09/25/2017)   No facility-administered encounter medications on file as of 09/25/2017.     Activities of Daily Living In your present state of health, do you have any difficulty performing the following activities: 09/25/2017  Hearing? Y  Comment feels like loosing hearing has to turn TV up alot louder than usual.  Vision? Y  Comment needs to get glasses  Difficulty concentrating or making decisions? N  Walking or climbing stairs? Y  Comment gets short of breath when going up and down  Dressing or bathing? N  Doing errands, shopping? N  Preparing Food and eating ? N  Using the Toilet? N  In the past six months, have you accidently leaked urine? N  Do you have problems with loss of bowel control? N  Managing your Medications? N  Managing your Finances? N  Housekeeping or managing your Housekeeping? N  Some recent data might be hidden    Patient Care Team: [name], DO as PCP - General (Osteopathic Medicine) Stanford Breed Denice Bors, MD as PCP - Cardiology (Cardiology)    Assessment:   This is a routine wellness examination for Miaisabella.Physical assessment deferred to PCP.   Exercise Activities and Dietary recommendations Current Exercise Habits: The patient does not participate in regular exercise at present, Exercise limited by: orthopedic condition(s)(back pain. Getting injections) Diet trys to eat vegetables and fruits when she can. Breakfast:no Lunch: soup Dinner:meat, vegetables, rice Patient reports drinks a lot of water daily.       Goals    . Increase physical activity     Discussed working on walking more. At least working up to 30  minutes 3 times a week.  Start out with walking 5 minutes to statr to build up youur endurance. Continue to drink water daily. Make sure taking your calcium with Vitamin D       Fall Risk Fall Risk  09/25/2017 10/03/2016 08/20/2015  Falls in the past year? No No No   Is the patient's home free of loose throw rugs in walkways, pet beds, electrical cords, etc?   yes      Grab bars in the bathroom? yes      Handrails on the stairs?   no      Adequate lighting?   yes   Depression Screen PHQ 2/9 Scores 09/25/2017 02/01/2017 12/09/2015 08/20/2015  PHQ - 2 Score 2 2 6 1   PHQ- 9 Score 3 11 14 1      Cognitive Function     6CIT Screen 09/25/2017  What Year? 0 points  What month? 0 points  What time? 0 points  Count back from 20 0 points  Repeat phrase 2 points    Immunization History  Administered Date(s) Administered  . Influenza, High Dose Seasonal PF 09/29/2016  . Influenza-Unspecified 11/24/2015    Screening Tests Health Maintenance  Topic Date Due  . Hepatitis C Screening  January 03, 1946  . COLONOSCOPY  12/09/1993  . DEXA SCAN  12/09/2008  . INFLUENZA VACCINE  04/25/2018 (Originally 08/10/2017)  . TETANUS/TDAP  06/07/2018 (Originally 12/10/1962)  . PNA vac Low Risk Adult (1 of 2 - PCV13) 06/07/2018 (Originally 12/09/2008)  . MAMMOGRAM  03/31/2019       Plan:  Please schedule your next medicare wellness visit with me in 1 yr.   Ms. Labuda , Thank you for taking time to come for your Medicare Wellness Visit. I appreciate your  ongoing commitment to your health goals. Please review the following plan we discussed and let me know if I can assist you in the future.   Continue doing brain stimulating activities (puzzles, reading, adult coloring books, staying active) to keep memory sharp.   These are the goals we discussed: Goals    . Increase physical activity     Discussed working on walking more. At least working up to 30 minutes 3 times a week.  Start out with walking 5  minutes to statr to build up youur endurance. Continue to drink water daily. Make sure taking your calcium with Vitamin D       This is a list of the screening recommended for you and due dates:  Health Maintenance  Topic Date Due  .  Hepatitis C: One time screening is recommended by Center for Disease Control  (CDC) for  adults born from [birth year] through 1967.   [birth year]  . Colon Cancer Screening  12/09/1993  . DEXA scan (bone density measurement)  12/09/2008  . Flu Shot  04/25/2018*  . Tetanus Vaccine  06/07/2018*  . Pneumonia vaccines (1 of 2 - PCV13) 06/07/2018*  . Mammogram  03/31/2019  *Topic was postponed. The date shown is not the original due date.     I have personally reviewed and noted the following in the patient's chart:   . Medical and social history . Use of alcohol, tobacco or illicit drugs  . Current medications and supplements . Functional ability and status . Nutritional status . Physical activity . Advanced directives . List of other physicians . Hospitalizations, surgeries, and ER visits in previous 12 months . Vitals . Screenings to include cognitive, depression, and falls . Referrals and appointments  In addition, I have reviewed and discussed with patient certain preventive protocols, quality metrics, and best practice recommendations. A written personalized care plan for preventive services as well as general preventive health recommendations were provided to patient.     Joanne Chars, LPN  4/40/1027

## 2017-09-21 ENCOUNTER — Ambulatory Visit (INDEPENDENT_AMBULATORY_CARE_PROVIDER_SITE_OTHER): Payer: Medicare Other | Admitting: Osteopathic Medicine

## 2017-09-21 ENCOUNTER — Encounter: Payer: Self-pay | Admitting: Osteopathic Medicine

## 2017-09-21 VITALS — BP 145/84 | HR 81 | Ht 65.0 in | Wt 209.0 lb

## 2017-09-21 DIAGNOSIS — F5101 Primary insomnia: Secondary | ICD-10-CM

## 2017-09-21 MED ORDER — ALPRAZOLAM 0.5 MG PO TABS: 0.5000 mg | ORAL_TABLET | Freq: Every evening | ORAL | 0 refills | Status: DC | PRN

## 2017-09-21 NOTE — Patient Instructions (Signed)
Call insurance and ask if Belsomra is on the formulary for insomnia.  Can try Melatonin around dinnertime or a few hours before bed Xanax for one week, then sparing use after that

## 2017-09-21 NOTE — Progress Notes (Signed)
HPI: Carrie Salazar is a 72 y.o. female who  has a past medical history of Chronic fatigue, Depression, Fatigue (08/16/2016), Fibromyalgia, Heart attack ([place]), CABG, Hyperlipidemia, Hypertension, and Migraine.  she presents to [facility] today, 09/21/17,  for chief complaint of:  Insomnia  Stopped taking Prozac, has been feeling better off of this medication in terms of less mental fogginess and feeling a bit more creative.  She would like to get off of some of her medications, quitting pain medicines.  She has been doing really well with recent injections for back pain.  She has some concerns that lately she just has not been able to get to sleep, she did well on a short course of alprazolam in the past and then was able to come off of this without significant problems.  She has already been diligent about behavioral modifications such as lights/food before bedtime, not lying in bed awake, etc.  Devious medications tried include Ambien, trazodone.  Belsomra was prescribed at one point but she never got this due to cost.   Past medical history, surgical history, and family history reviewed.  Current medication list and allergy/intolerance information reviewed.   (See remainder of HPI, ROS, Phys Exam below)  Dg Inject Diag/thera/inc Needle/cath/plc Epi/lumb/sac W/img  Result Date: 09/18/2017 CLINICAL DATA:  Neuropathic pain, leg, bilateral. Patient complaints of both upper lumbar pain and lumbosacral pain extending into the lower extremities. 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 L1-2 epidural steroid injection is requested. FLUOROSCOPY TIME:  Radiation Exposure Index (as provided by the fluoroscopic device): 63.97 uGy*m2 Fluoroscopy Time:  26 seconds Number of Acquired Images:  0 PROCEDURE: The procedure, risks, benefits, and alternatives were explained to the patient. Questions regarding the procedure were encouraged and answered. The patient understands  and consents to the procedure. LUMBAR EPIDURAL INJECTION: An interlaminar approach was performed on left at L1-2. The overlying skin was cleansed and anesthetized. A 20 gauge epidural needle was advanced using loss-of-resistance technique. DIAGNOSTIC EPIDURAL INJECTION: Injection of Isovue-M 200 shows a good epidural pattern with spread above and below the level of needle placement, primarily on the left no vascular opacification is seen. THERAPEUTIC EPIDURAL INJECTION: 120 mg of Depo-Medrol mixed with 3 mL 1% lidocaine were instilled. The procedure was well-tolerated, and the patient was discharged thirty minutes following the injection in good condition. COMPLICATIONS: None IMPRESSION: Technically successful epidural injection on the left L1-2 # 1 Electronically Signed   By: San Morelle M.D.   On: 09/18/2017 15:35    No results found for this or any previous visit (from the past 72 hour(s)).   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  I am okay to trial short course benzodiazepine to hopefully get the circadian rhythm back to more normal, but patient is advised that I do not recommend continuation of these medications long-term.     Meds ordered this encounter  Medications  . ALPRAZolam (XANAX) 0.5 MG tablet    Sig: Take 1 tablet (0.5 mg total) by mouth at bedtime as needed for anxiety or sleep. Every night for one week, then 1-2 times per week as needed    Dispense:  30 tablet    Refill:  0    Patient Instructions  Call insurance and ask if Belsomra is on the formulary for insomnia.  Can try Melatonin around dinnertime or a few hours before bed Xanax for one week, then sparing use after that    Follow-up plan: Return in  about 4 weeks (around 10/19/2017) for recheck insomnia, see me sooner if needed  .                     ############################################ ############################################ ############################################ ############################################    Outpatient Encounter Medications as of 09/21/2017  Medication Sig  . aspirin EC 325 MG tablet Take 1 tablet (325 mg total) by mouth daily.  . carvedilol (COREG) 12.5 MG tablet Take 6 mg by mouth 2 (two) times daily.   . cetirizine (ZYRTEC) 5 MG tablet Take 1 tablet (5 mg total) by mouth daily.  . Cholecalciferol (VITAMIN D3) 5000 units CAPS Take 1 capsule by mouth daily.  Marland Kitchen estradiol (ESTRACE) 0.1 MG/GM vaginal cream INSERT 1/2 TO 1 APPLICATORFUL VAGINALLY 3 TIMES A WEEK  . gabapentin (NEURONTIN) 600 MG tablet TAKE 1 TABLET BY MOUTH IN THE MORNING, 1 TABLET IN THE AFTERNOON, AND 2 TABLETS IN THE EVENING( 1200 MG TOTAL PER DAY)  . ipratropium (ATROVENT HFA) 17 MCG/ACT inhaler Inhale 2 puffs into the lungs every 4 (four) hours as needed for wheezing.  Marland Kitchen lisinopril (PRINIVIL,ZESTRIL) 40 MG tablet Take 1 tablet (40 mg total) by mouth 2 (two) times daily.  . nitroGLYCERIN (NITROSTAT) 0.4 MG SL tablet Place 1 tablet (0.4 mg total) under the tongue every 5 (five) minutes as needed for chest pain.  Marland Kitchen ondansetron (ZOFRAN-ODT) 8 MG disintegrating tablet Take 1 tablet (8 mg total) by mouth every 8 (eight) hours as needed for nausea.  . ranolazine (RANEXA) 500 MG 12 hr tablet TAKE 1 TABLET BY MOUTH TWO  TIMES DAILY  . diphenhydrAMINE (BENADRYL) 50 MG tablet Take 1 tablet (50 mg total) by mouth once for 1 dose. 1 hour prior to contrast administration  . FLUoxetine (PROZAC) 40 MG capsule TAKE ONE CAPSULE BY MOUTH DAILY (Patient not taking: Reported on 09/21/2017)   No facility-administered encounter medications on file as of 09/21/2017.    Allergies  Allergen Reactions  . Albuterol Palpitations and Other (See Comments)    HALLUCINATIONS Also feels "funny in the head"  . Dilaudid  [Hydromorphone] Anaphylaxis    Caused patient to have heart attack and crazy in the head  . Codeine Hives and Itching  . Ivp Dye [Iodinated Diagnostic Agents] Other (See Comments)    Itching and causes me to pass out  . Lanolin Hives    "eats skin off"  . Latex Dermatitis    LEAVES REALLY BAD BURN ON SKIN   . Levaquin [Levofloxacin In D5w] Other (See Comments)    "I get real dizzy and pass out"    . Metrizamide Hives, Itching, Rash and Other (See Comments)    Itching and causes me to pass out  . Sulfa Antibiotics Hives and Other (See Comments)    Itching and causes me to pass out**PT CAN TAKE BENADRYL 2 HOURS PRIOR AND IS OK**   . Nickel Rash  . Penicillins Hives  . Percocet [Oxycodone-Acetaminophen] Itching  . Tape Itching      Review of Systems:  Constitutional: No recent illness  Cardiac: No  chest pain, No  pressure, No palpitations  Respiratory:  No  shortness of breath. No  Cough  Gastrointestinal: No  abdominal pain, no change on bowel habits  Musculoskeletal: No new myalgia/arthralgia  Skin: No  Rash  Neurologic: No  weakness, No  Dizziness  Psychiatric: No  concerns with depression, No  concerns with anxiety  Exam:  BP (!) 145/84   Pulse 81   Ht 5\' 5"  (  1.651 m)   Wt 209 lb (94.8 kg)   BMI 34.78 kg/m   Constitutional: VS see above. General Appearance: alert, well-developed, well-nourished, NAD  Eyes: Normal lids and conjunctive, non-icteric sclera  Ears, Nose, Mouth, Throat: MMM, Normal external inspection ears/nares/mouth/lips/gums.  Neck: No masses, trachea midline.   Respiratory: Normal respiratory effort. no wheeze, no rhonchi, no rales  Cardiovascular: S1/S2 normal, no murmur, no rub/gallop auscultated. RRR.   Musculoskeletal: Gait normal. Symmetric and independent movement of all extremities  Neurological: Normal balance/coordination. No tremor.  Skin: warm, dry, intact.   Psychiatric: Normal judgment/insight. Normal mood and affect.  Oriented x3.   Visit summary with medication list and pertinent instructions was printed for patient to review, advised to alert [place] if any changes needed. All questions at time of visit were answered - patient instructed to contact office with any additional concerns. ER/RTC precautions were reviewed with the patient and understanding verbalized.   Follow-up plan: Return in about 4 weeks (around 10/19/2017) for recheck insomnia, see me sooner if needed .  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  Please note: voice recognition software was used to produce this document, and typos may escape review. Please contact Dr. Sheppard Coil for any needed clarifications.

## 2017-09-25 ENCOUNTER — Ambulatory Visit (INDEPENDENT_AMBULATORY_CARE_PROVIDER_SITE_OTHER): Payer: Medicare Other | Admitting: *Deleted

## 2017-09-25 VITALS — BP 108/60 | HR 82 | Ht 65.0 in | Wt 206.0 lb

## 2017-09-25 DIAGNOSIS — Z0000 Encounter for general adult medical examination without abnormal findings: Secondary | ICD-10-CM

## 2017-09-25 NOTE — Patient Instructions (Signed)
Please schedule your next medicare wellness visit with me in 1 yr.   Ms. Carrie Salazar , Thank you for taking time to come for your Medicare Wellness Visit. I appreciate your ongoing commitment to your health goals. Please review the following plan we discussed and let me know if I can assist you in the future.  Continue doing brain stimulating activities (puzzles, reading, adult coloring books, staying active) to [place] stands for "Dietary Approaches to Stop Hypertension." The DASH eating plan is a healthy eating plan that has been shown to reduce high blood pressure (hypertension). It may also reduce your risk for type 2 diabetes, heart disease, and stroke. The DASH eating plan may also help with weight loss. What are tips for following this plan? General guidelines  Avoid eating more than 2,300 mg (milligrams) of salt (sodium) a day. If you have hypertension, you may need to reduce your sodium intake to 1,500 mg a day.  Limit alcohol intake to no more than 1 drink a day for nonpregnant women and 2 drinks a day for men. One drink equals 12 oz of beer, 5 oz of wine, or 1 oz of hard liquor.  Work with your health care provider to maintain a healthy body weight or to lose weight. Ask what an ideal weight is for you.  Get at least 30 minutes of exercise that causes your heart to beat faster (aerobic exercise) most days of the week. Activities may include walking, swimming, or biking.  Work with your health care provider or diet and nutrition specialist (dietitian) to adjust your eating plan to your individual calorie needs. Reading food labels  Check food labels for the amount of sodium per serving. Choose foods with less than 5 percent of the Daily Value of sodium. Generally, foods with less than 300 mg of sodium per serving fit into this eating plan.  To find whole grains, look for the word "whole" as the first word in the ingredient list. Shopping  Buy products labeled as  "low-sodium" or "no salt added."  Buy fresh foods. Avoid canned foods and premade or frozen meals. Cooking  Avoid adding salt when cooking. Use salt-free seasonings or herbs instead of table salt or sea salt. Check with your health care provider or pharmacist before using salt substitutes.  Do not fry foods. Cook foods using healthy methods such as baking, boiling, grilling, and broiling instead.  Cook with heart-healthy oils, such as olive, canola, soybean, or sunflower oil. Meal planning   Eat a balanced diet that includes: ? 5 or more servings of fruits and vegetables each day. At each meal, try to fill half of your plate with fruits and vegetables. ? Up to 6-8 servings of whole grains each day. ? Less than 6 oz of lean meat, poultry, or fish each day. A 3-oz serving of meat is about the same size as a deck of cards. One egg equals 1 oz. ? 2 servings of low-fat dairy each day. ? A serving of nuts, seeds, or beans 5 times each week. ? Heart-healthy fats. Healthy fats called Omega-3 fatty acids are found in foods such as flaxseeds and coldwater fish, like sardines, salmon, and mackerel.  Limit how much you eat of the following: ? Canned or prepackaged foods. ? Food that is high in trans fat, such as fried foods. ? Food that is high in saturated fat, such as fatty meat. ? Sweets, desserts, sugary drinks, and other foods with added sugar. ? Full-fat  dairy products.  Do not salt foods before eating.  Try to eat at least 2 vegetarian meals each week.  Eat more home-cooked food and less restaurant, buffet, and fast food.  When eating at a restaurant, ask that your food be prepared with less salt or no salt, if possible. What foods are recommended? The items listed may not be a complete list. Talk with your dietitian about what dietary choices are best for you. Grains Whole-grain or whole-wheat bread. Whole-grain or whole-wheat pasta. Brown rice. [place]. Bulgur. Whole-grain  and low-sodium cereals. Pita bread. Low-fat, low-sodium crackers. Whole-wheat flour tortillas. Vegetables Fresh or frozen vegetables (raw, steamed, roasted, or grilled). Low-sodium or reduced-sodium tomato and vegetable juice. Low-sodium or reduced-sodium tomato sauce and tomato paste. Low-sodium or reduced-sodium canned vegetables. Fruits All fresh, dried, or frozen fruit. Canned fruit in natural juice (without added sugar). Meat and other protein foods Skinless chicken or [place]. Ground chicken or [place]. Pork with fat trimmed off. Fish and seafood. Egg whites. Dried beans, peas, or lentils. Unsalted nuts, nut butters, and seeds. Unsalted canned beans. Lean cuts of beef with fat trimmed off. Low-sodium, lean deli meat. Dairy Low-fat (1%) or fat-free (skim) milk. Fat-free, low-fat, or reduced-fat cheeses. Nonfat, low-sodium ricotta or cottage cheese. Low-fat or nonfat yogurt. Low-fat, low-sodium cheese. Fats and oils Soft margarine without trans fats. Vegetable oil. Low-fat, reduced-fat, or light mayonnaise and salad dressings (reduced-sodium). Canola, safflower, olive, soybean, and sunflower oils. Avocado. Seasoning and other foods Herbs. Spices. Seasoning mixes without salt. Unsalted popcorn and pretzels. Fat-free sweets. What foods are not recommended? The items listed may not be a complete list. Talk with your dietitian about what dietary choices are best for you. Grains Baked goods made with fat, such as croissants, muffins, or some breads. Dry pasta or rice meal packs. Vegetables Creamed or fried vegetables. Vegetables in a cheese sauce. Regular canned vegetables (not low-sodium or reduced-sodium). Regular canned tomato sauce and paste (not low-sodium or reduced-sodium). Regular tomato and vegetable juice (not low-sodium or reduced-sodium). Carrie Salazar. Olives. Fruits Canned fruit in a light or heavy syrup. Fried fruit. Fruit in cream or butter sauce. Meat and other protein foods Fatty cuts  of meat. Ribs. Fried meat. Carrie Salazar. Sausage. Bologna and other processed lunch meats. Salami. Fatback. Hotdogs. Bratwurst. Salted nuts and seeds. Canned beans with added salt. Canned or smoked fish. Whole eggs or egg yolks. Chicken or [place] with skin. Dairy Whole or 2% milk, cream, and half-and-half. Whole or full-fat cream cheese. Whole-fat or sweetened yogurt. Full-fat cheese. Nondairy creamers. Whipped toppings. Processed cheese and cheese spreads. Fats and oils Butter. Stick margarine. Lard. Shortening. Ghee. Bacon fat. Tropical oils, such as coconut, palm kernel, or palm oil. Seasoning and other foods Salted popcorn and pretzels. Onion salt, garlic salt, seasoned salt, table salt, and sea salt. Worcestershire sauce. Tartar sauce. Barbecue sauce. Teriyaki sauce. Soy sauce, including reduced-sodium. Steak sauce. Canned and packaged gravies. Fish sauce. Oyster sauce. Cocktail sauce. Horseradish that you find on the shelf. Ketchup. Mustard. Meat flavorings and tenderizers. Bouillon cubes. Hot sauce and Tabasco sauce. Premade or packaged marinades. Premade or packaged taco seasonings. Relishes. Regular salad dressings. Where to find more information:  National Heart, Lung, and [place]: https://wilson-eaton.com/  American Heart Association: www.heart.org Summary  The DASH eating plan is a healthy eating plan that has been shown to reduce high blood pressure (hypertension). It may also reduce your risk for type 2 diabetes, heart disease, and stroke.  With the DASH eating plan, you should  limit salt (sodium) intake to 2,300 mg a day. If you have hypertension, you may need to reduce your sodium intake to 1,500 mg a day.  When on the DASH eating plan, aim to eat more fresh fruits and vegetables, whole grains, lean proteins, low-fat dairy, and heart-healthy fats.  Work with your health care provider or diet and nutrition specialist (dietitian) to adjust your eating plan to your individual calorie  needs. This information is not intended to replace advice given to you by your health care provider. Make sure you discuss any questions you have with your health care provider. Document Released: 12/16/2010 Document Revised: 12/21/2015 Document Reviewed: 12/21/2015 Elsevier Interactive Patient Education  2018 [employer]. Low-Fiber Diet Fiber is found in fruits, vegetables, and whole grains. A low-fiber diet restricts fibrous foods that are not digested in the small intestine. A diet containing about 10-15 grams of fiber per day is considered low fiber. Low-fiber diets may be used to:  Promote healing and rest the bowel during intestinal flare-ups.  Prevent blockage of a partially obstructed or narrowed gastrointestinal tract.  Reduce fecal weight and volume.  Slow the movement of feces.  You may be on a low-fiber diet as a transitional diet following surgery, after an injury (trauma), or because of a short (acute) or lifelong (chronic) illness. Your health care provider will determine the length of time you need to stay on this diet. What do I need to know about a low-fiber diet? Always check the fiber content on the packaging's Nutrition Facts label, especially on foods from the grains list. Ask your dietitian if you have questions about specific foods that are related to your condition, especially if the food is not listed below. In general, a low-fiber food will have less than 2 g of fiber. What foods can I eat? Grains All breads and crackers made with white flour. Sweet rolls, doughnuts, waffles, pancakes, [place] toast, bagels. Pretzels, Melba toast, zwieback. Well-cooked cereals, such as cornmeal, farina, or cream cereals. Dry cereals that do not contain whole grains, fruit, or nuts, such as refined corn, wheat, rice, and oat cereals. Potatoes prepared any way without skins, plain pastas and noodles, refined white rice. Use white flour for baking and making sauces. Use allowed list of  grains for casseroles, dumplings, and puddings. Vegetables Strained tomato and vegetable juices. Fresh lettuce, cucumber, spinach. Well-cooked (no skin or pulp) or canned vegetables, such as asparagus, bean sprouts, beets, carrots, green beans, mushrooms, potatoes, pumpkin, spinach, yellow squash, tomato sauce/puree, turnips, yams, and zucchini. Keep servings limited to  cup. Fruits All fruit juices except prune juice. Cooked or canned fruits without skin and seeds, such as applesauce, apricots, cherries, fruit cocktail, grapefruit, grapes, mandarin oranges, melons, peaches, pears, pineapple, and plums. Fresh fruits without skin, such as apricots, avocados, bananas, melons, pineapple, nectarines, and peaches. Keep servings limited to  cup or 1 piece. Meat and Other Protein Sources Ground or well-cooked tender beef, ham, veal, lamb, pork, or poultry. Eggs, plain cheese. Fish, oysters, shrimp, lobster, and other seafood. Liver, organ meats. Smooth nut butters. Dairy All milk products and alternative dairy substitutes, such as soy, rice, almond, and coconut, not containing added whole nuts, seeds, or added fruit. Beverages Decaf coffee, fruit, and vegetable juices or smoothies (small amounts, with no pulp or skins, and with fruits from allowed list), sports drinks, herbal tea. Condiments Ketchup, mustard, vinegar, cream sauce, cheese sauce, cocoa powder. Spices in moderation, such as allspice, basil, bay leaves, celery powder or leaves,  cinnamon, cumin powder, curry powder, ginger, mace, marjoram, onion or garlic powder, oregano, paprika, parsley flakes, ground pepper, rosemary, sage, savory, tarragon, thyme, and turmeric. Sweets and Desserts Plain cakes and cookies, pie made with allowed fruit, pudding, custard, cream pie. Gelatin, fruit, ice, sherbet, frozen ice pops. Ice cream, ice milk without nuts. Plain hard candy, honey, jelly, molasses, syrup, sugar, chocolate syrup, gumdrops, marshmallows.  Limit overall sugar intake. Fats and Oil Margarine, butter, cream, mayonnaise, salad oils, plain salad dressings made from allowed foods. Choose healthy fats such as olive oil, canola oil, and omega-3 fatty acids (such as found in salmon or tuna) when possible. Other Bouillon, broth, or cream soups made from allowed foods. Any strained soup. Casseroles or mixed dishes made with allowed foods. The items listed above may not be a complete list of recommended foods or beverages. Contact your dietitian for more options. What foods are not recommended? Grains All whole wheat and whole grain breads and crackers. Multigrains, rye, bran seeds, nuts, or coconut. Cereals containing whole grains, multigrains, bran, coconut, nuts, raisins. Cooked or dry oatmeal, steel-cut oats. Coarse wheat cereals, granola. Cereals advertised as high fiber. Potato skins. Whole grain pasta, wild or brown rice. Popcorn. Coconut flour. Bran, buckwheat, corn bread, multigrains, rye, wheat germ. Vegetables Fresh, cooked or canned vegetables, such as artichokes, asparagus, beet greens, broccoli, Brussels sprouts, cabbage, celery, cauliflower, corn, eggplant, kale, legumes or beans, okra, peas, and tomatoes. Avoid large servings of any vegetables, especially raw vegetables. Fruits Fresh fruits, such as apples with or without skin, berries, cherries, figs, grapes, grapefruit, guavas, kiwis, mangoes, oranges, papayas, pears, persimmons, pineapple, and pomegranate. Prune juice and juices with pulp, stewed or dried prunes. Dried fruits, dates, raisins. Fruit seeds or skins. Avoid large servings of all fresh fruits. Meats and Other Protein Sources Tough, fibrous meats with gristle. Chunky nut butter. Cheese made with seeds, nuts, or other foods not recommended. Nuts, seeds, legumes (beans, including baked beans), dried peas, beans, lentils. Dairy Yogurt or cheese that contains nuts, seeds, or added fruit. Beverages Fruit juices with high  pulp, prune juice. Caffeinated coffee and teas. Condiments Coconut, maple syrup, pickles, olives. Sweets and Desserts Desserts, cookies, or candies that contain nuts or coconut, chunky peanut butter, dried fruits. Jams, preserves with seeds, marmalade. Large amounts of sugar and sweets. Any other dessert made with fruits from the not recommended list. Other Soups made from vegetables that are not recommended or that contain other foods not recommended. The items listed above may not be a complete list of foods and beverages to avoid. Contact your dietitian for more information. This information is not intended to replace advice given to you by your health care provider. Make sure you discuss any questions you have with your health care provider. Document Released: 06/18/2001 Document Revised: 06/04/2015 Document Reviewed: 11/19/2012 Elsevier Interactive Patient Education  2017 [employer].  keep memory sharp.   These are the goals we discussed: Goals    . Increase physical activity     Discussed working on walking more. At least working up to 30 minutes 3 times a week.  Start out with walking 5 minutes to statr to build up youur endurance. Continue to drink water daily. Make sure taking your calcium with Vitamin D

## 2017-10-10 ENCOUNTER — Other Ambulatory Visit: Payer: Self-pay | Admitting: Family Medicine

## 2017-10-10 DIAGNOSIS — G5793 Unspecified mononeuropathy of bilateral lower limbs: Secondary | ICD-10-CM

## 2017-10-11 ENCOUNTER — Telehealth: Payer: Self-pay | Admitting: Family Medicine

## 2017-10-11 DIAGNOSIS — M5441 Lumbago with sciatica, right side: Secondary | ICD-10-CM

## 2017-10-11 DIAGNOSIS — M5442 Lumbago with sciatica, left side: Principal | ICD-10-CM

## 2017-10-11 NOTE — Telephone Encounter (Signed)
Patient called and states that the last spinal injection helped and she is wanitng to get another one completed. She is requesting another spinal injection for relief. Please advise.

## 2017-10-12 NOTE — Telephone Encounter (Signed)
Repeat lumbar epidural steroid injection ordered.

## 2017-10-12 NOTE — Telephone Encounter (Signed)
[facility] has been notified to contact the patient. Rhonda Cunningham,CMA

## 2017-10-13 ENCOUNTER — Ambulatory Visit
Admission: RE | Admit: 2017-10-13 | Discharge: 2017-10-13 | Disposition: A | Discharge status: Home / Self Care | Payer: Medicare Other | Source: Ambulatory Visit | Attending: Family Medicine | Admitting: Family Medicine

## 2017-10-13 DIAGNOSIS — M4727 Other spondylosis with radiculopathy, lumbosacral region: Secondary | ICD-10-CM | POA: Diagnosis not present

## 2017-10-13 MED ORDER — IOPAMIDOL (ISOVUE-M 200) INJECTION 41%
1.0000 mL | Freq: Once | INTRAMUSCULAR | Status: AC
  Administered 2017-10-13: 1 mL via EPIDURAL

## 2017-10-13 MED ORDER — METHYLPREDNISOLONE ACETATE 40 MG/ML INJ SUSP (RADIOLOG
120.0000 mg | Freq: Once | INTRAMUSCULAR | Status: AC
  Administered 2017-10-13: 120 mg via EPIDURAL

## 2017-10-13 NOTE — Discharge Instructions (Signed)

## 2017-10-15 ENCOUNTER — Emergency Department (HOSPITAL_BASED_OUTPATIENT_CLINIC_OR_DEPARTMENT_OTHER)
Admission: EM | Admit: 2017-10-15 | Discharge: 2017-10-15 | Disposition: A | Discharge status: Home / Self Care | Payer: Medicare Other | Attending: Emergency Medicine | Admitting: Emergency Medicine

## 2017-10-15 ENCOUNTER — Other Ambulatory Visit: Payer: Self-pay

## 2017-10-15 ENCOUNTER — Encounter (HOSPITAL_BASED_OUTPATIENT_CLINIC_OR_DEPARTMENT_OTHER): Payer: Self-pay | Admitting: Emergency Medicine

## 2017-10-15 DIAGNOSIS — Z79899 Other long term (current) drug therapy: Secondary | ICD-10-CM | POA: Insufficient documentation

## 2017-10-15 DIAGNOSIS — Z87891 Personal history of nicotine dependence: Secondary | ICD-10-CM | POA: Diagnosis not present

## 2017-10-15 DIAGNOSIS — Z7982 Long term (current) use of aspirin: Secondary | ICD-10-CM | POA: Diagnosis not present

## 2017-10-15 DIAGNOSIS — I10 Essential (primary) hypertension: Secondary | ICD-10-CM | POA: Insufficient documentation

## 2017-10-15 DIAGNOSIS — R110 Nausea: Secondary | ICD-10-CM | POA: Diagnosis not present

## 2017-10-15 DIAGNOSIS — I2510 Atherosclerotic heart disease of native coronary artery without angina pectoris: Secondary | ICD-10-CM | POA: Insufficient documentation

## 2017-10-15 DIAGNOSIS — R079 Chest pain, unspecified: Secondary | ICD-10-CM | POA: Diagnosis not present

## 2017-10-15 DIAGNOSIS — R112 Nausea with vomiting, unspecified: Secondary | ICD-10-CM | POA: Diagnosis present

## 2017-10-15 LAB — URINALYSIS, MICROSCOPIC (REFLEX)

## 2017-10-15 LAB — URINALYSIS, ROUTINE W REFLEX MICROSCOPIC
BILIRUBIN URINE: NEGATIVE
Glucose, UA: NEGATIVE mg/dL
HGB URINE DIPSTICK: NEGATIVE
Ketones, ur: NEGATIVE mg/dL
Nitrite: NEGATIVE
Protein, ur: NEGATIVE mg/dL
pH: 5 (ref 5.0–8.0)

## 2017-10-15 LAB — CBC
HCT: 43.7 % (ref 36.0–46.0)
Hemoglobin: 14.5 g/dL (ref 12.0–15.0)
MCH: 31.2 pg (ref 26.0–34.0)
MCHC: 33.2 g/dL (ref 30.0–36.0)
MCV: 94 fL (ref 78.0–100.0)
Platelets: 302 10*3/uL (ref 150–400)
RBC: 4.65 MIL/uL (ref 3.87–5.11)
RDW: 14.2 % (ref 11.5–15.5)
WBC: 7.5 10*3/uL (ref 4.0–10.5)

## 2017-10-15 LAB — COMPREHENSIVE METABOLIC PANEL
ALT: 21 U/L (ref 0–44)
AST: 22 U/L (ref 15–41)
Albumin: 4.4 g/dL (ref 3.5–5.0)
Alkaline Phosphatase: 59 U/L (ref 38–126)
Anion gap: 10 (ref 5–15)
BUN: 16 mg/dL (ref 8–23)
CALCIUM: 9.5 mg/dL (ref 8.9–10.3)
CO2: 24 mmol/L (ref 22–32)
Chloride: 105 mmol/L (ref 98–111)
Creatinine, Ser: 1.08 mg/dL — ABNORMAL HIGH (ref 0.44–1.00)
GFR, EST AFRICAN AMERICAN: 58 mL/min — AB (ref >60)
GFR, EST NON AFRICAN AMERICAN: 50 mL/min — AB (ref >60)
Glucose, Bld: 105 mg/dL — ABNORMAL HIGH (ref 70–99)
Potassium: 3.6 mmol/L (ref 3.5–5.1)
Sodium: 139 mmol/L (ref 135–145)
TOTAL PROTEIN: 7.7 g/dL (ref 6.5–8.1)
Total Bilirubin: 0.4 mg/dL (ref 0.3–1.2)

## 2017-10-15 LAB — LIPASE, BLOOD: LIPASE: 34 U/L (ref 11–51)

## 2017-10-15 LAB — TROPONIN I

## 2017-10-15 MED ORDER — GI COCKTAIL ~~LOC~~
30.0000 mL | Freq: Once | ORAL | Status: AC
  Administered 2017-10-15: 30 mL via ORAL
  Filled 2017-10-15: qty 30

## 2017-10-15 MED ORDER — FAMOTIDINE 20 MG PO TABS
20.0000 mg | ORAL_TABLET | Freq: Once | ORAL | Status: AC
  Administered 2017-10-15: 20 mg via ORAL
  Filled 2017-10-15: qty 1

## 2017-10-15 MED ORDER — LORAZEPAM 1 MG PO TABS
1.0000 mg | ORAL_TABLET | Freq: Once | ORAL | Status: AC
  Administered 2017-10-15: 1 mg via ORAL
  Filled 2017-10-15: qty 1

## 2017-10-15 MED ORDER — METOCLOPRAMIDE HCL 10 MG PO TABS
10.0000 mg | ORAL_TABLET | Freq: Once | ORAL | Status: AC
  Administered 2017-10-15: 10 mg via ORAL
  Filled 2017-10-15: qty 1

## 2017-10-15 NOTE — ED Notes (Signed)
Tried X2 to obtain blood samples, 1 in left AC, 1 in left hand.

## 2017-10-15 NOTE — ED Triage Notes (Addendum)
Pt reports N/V since having and epidural injection in her back on Friday. Pt took 1 ODT zofran PTA without relief.

## 2017-10-15 NOTE — ED Notes (Signed)
Pt reports feeling diaphoretic and experiencing nausea x 2 days. Pt denies vomiting/diarrhea. Pt unsure of fevers. Pt neuro intact but reports slight HA yesterday. Feels weak

## 2017-10-15 NOTE — ED Provider Notes (Signed)
[facility] EMERGENCY DEPARTMENT Provider Note   CSN: [id number] Arrival date & time: 10/15/17  1548     History   Chief Complaint Chief Complaint  Patient presents with  . Emesis    HPI Carrie Salazar is a 72 y.o. female.  Pt c/o nausea in past couple days. States 2 days ago had epidural injection - states that helped her pain, but now has nausea. Symptoms moderate, persistent, gradual onset. No vomiting. No diarrhea. Denies abd pain. Had chills earlier. Denies cough or uri symptoms. No headache. No chest pain or discomfort. No sob. No dysuria. Denies pain to back or in area of injection, no erythema to area.   The history is provided by the patient.  Emesis   Pertinent negatives include no abdominal pain, no cough, no fever and no headaches.    Past Medical History:  Diagnosis Date  . Chronic fatigue   . Depression   . Fatigue 08/16/2016  . Fibromyalgia   . Heart attack ([place])   . Hx of CABG   . Hyperlipidemia   . Hypertension   . Migraine     Patient Active Problem List   Diagnosis Date Noted  . Presbyopia 08/08/2017  . Puckering of macula, right eye 08/08/2017  . Retinal hemorrhage, right eye 08/08/2017  . Rectal prolapse 07/25/2017  . Palpitations 02/01/2017  . Folate deficiency 02/01/2017  . History of anemia 02/01/2017  . B12 deficiency 02/01/2017  . Migraine   . Hypertension   . Hyperlipidemia   . Hx of CABG   . Heart attack ([place])   . Fibromyalgia   . Depression   . Chronic fatigue   . Trochanteric bursitis, left hip 11/30/2016  . Degenerative arthritis of thumb, right 11/30/2016  . Fatigue 08/16/2016  . Vitamin D deficiency 08/09/2016  . Neuropathy 05/24/2016  . Elevated serum creatinine 03/31/2016  . Blood pressure instability 03/31/2016  . Other headache syndrome 02/25/2016  . SOB (shortness of breath) on exertion 01/25/2016  . Anemia 01/25/2016  . Vaginal lesion 02-02-2015  . Moderate episode of recurrent major depressive disorder  ([place]) 12/10/2015  . Female cystocele 10/16/2015  . Urinary urgency 10/16/2015  . History of obstructive sleep apnea 08/20/2015  . Atrial flutter ([place]) 08/20/2015  . Coronary artery disease involving coronary bypass graft of native heart with angina pectoris ([place]) 08/13/2015  . Chronic back pain 08/13/2015  . Recurrent UTI 08/13/2015  . Insomnia 08/13/2015  . Hyperhidrosis 08/13/2015    Past Surgical History:  Procedure Laterality Date  . ABDOMINAL HYSTERECTOMY    . APPENDECTOMY    . BACK SURGERY    . BACK SURGERY     Lumbar rods  . BLADDER SURGERY    . BREAST BIOPSY Left    x 2  . BREAST EXCISIONAL BIOPSY Left    x 2  . BREAST LUMPECTOMY     left breast all benign (x2)  . CARPAL TUNNEL RELEASE Bilateral   . CORONARY ARTERY BYPASS GRAFT     Triple  . LYMPECTOMY  1980  . TONSILLECTOMY    . VAGINA SURGERY    . WRIST SURGERY       OB History   None      Home Medications    Prior to Admission medications   Medication Sig Start Date End Date Taking? Authorizing Provider  lisinopril (PRINIVIL,ZESTRIL) 40 MG tablet Take 1 tablet (40 mg total) by mouth 2 (two) times daily. 05/31/17  Yes Lelon Perla, MD  ranolazine (  RANEXA) 500 MG 12 hr tablet TAKE 1 TABLET BY MOUTH TWO  TIMES DAILY 05/01/17  Yes Crenshaw, Denice Bors, MD  ALPRAZolam Duanne Moron) 0.5 MG tablet Take 1 tablet (0.5 mg total) by mouth at bedtime as needed for anxiety or sleep. Every night for one week, then 1-2 times per week as needed 09/21/17   [name], DO  aspirin EC 325 MG tablet Take 1 tablet (325 mg total) by mouth daily. 05/31/17   Lelon Perla, MD  carvedilol (COREG) 12.5 MG tablet Take 6 mg by mouth 2 (two) times daily.  02/25/16   [provider]  cetirizine (ZYRTEC) 5 MG tablet Take 1 tablet (5 mg total) by mouth daily. 12/25/16   Noe Gens, PA-C  Cholecalciferol (VITAMIN D3) 5000 units CAPS Take 1 capsule by mouth daily.    [provider]  diphenhydrAMINE (BENADRYL) 50  MG tablet Take 1 tablet (50 mg total) by mouth once for 1 dose. 1 hour prior to contrast administration 02/07/17 02/07/17  [name], DO  estradiol (ESTRACE) 0.1 MG/GM vaginal cream INSERT 1/2 TO 1 APPLICATORFUL VAGINALLY 3 TIMES A WEEK 10/13/16   [name], DO  FLUoxetine (PROZAC) 40 MG capsule TAKE ONE CAPSULE BY MOUTH DAILY Patient not taking: Reported on 09/21/2017 07/19/17   [name], DO  gabapentin (NEURONTIN) 600 MG tablet TAKE 1 TABLET BY MOUTH IN THE MORNING, 1 TABLET IN THE AFTERNOON, AND 2 TABLETS IN THE EVENING( 1200 MG TOTAL PER DAY) 10/10/17   Gregor Hams, MD  ipratropium (ATROVENT HFA) 17 MCG/ACT inhaler Inhale 2 puffs into the lungs every 4 (four) hours as needed for wheezing. 12/22/16   [name], DO  nitroGLYCERIN (NITROSTAT) 0.4 MG SL tablet Place 1 tablet (0.4 mg total) under the tongue every 5 (five) minutes as needed for chest pain. 05/31/17   Lelon Perla, MD  ondansetron (ZOFRAN-ODT) 8 MG disintegrating tablet Take 1 tablet (8 mg total) by mouth every 8 (eight) hours as needed for nausea. Patient not taking: Reported on 09/25/2017 06/06/17   [name], DO    Family History Family History  Problem Relation Age of Onset  . Cancer Mother   . Hyperlipidemia Father   . Hypertension Father   . Stroke Father   . Heart attack Father   . Hyperlipidemia Maternal Grandmother   . Hypertension Maternal Grandmother   . Stroke Maternal Grandmother   . Hemophilia Maternal Grandfather     Social History Social History   Tobacco Use  . Smoking status: Former Smoker    Last attempt to quit: 2003    Years since quitting: 16.7  . Smokeless tobacco: Never Used  Substance Use Topics  . Alcohol use: Yes    Comment: 1 q wk  . Drug use: No     Allergies   Albuterol; Dilaudid [hydromorphone]; Codeine; Ivp dye [iodinated diagnostic agents]; Lanolin; Latex; Levaquin [levofloxacin in d5w]; Metrizamide; Sulfa antibiotics; Nickel;  Penicillins; Percocet [oxycodone-acetaminophen]; and Tape   Review of Systems Review of Systems  Constitutional: Negative for fever.  HENT: Negative for sore throat.   Eyes: Negative for redness.  Respiratory: Negative for cough and shortness of breath.   Cardiovascular: Negative for chest pain.  Gastrointestinal: Positive for nausea. Negative for abdominal pain and vomiting.  Genitourinary: Negative for dysuria.  Musculoskeletal: Negative for back pain, neck pain and neck stiffness.  Skin: Negative for rash.  Neurological: Negative for numbness and headaches.  Hematological: Does not bruise/bleed easily.  Psychiatric/Behavioral: Negative for  confusion.     Physical Exam Updated Vital Signs BP (!) 174/79 (BP Location: Right Arm)   Pulse 89   Temp 98.6 F (37 C) (Oral)   Resp 18   Ht 1.664 m (5' 5.5")   Wt 92.5 kg   SpO2 96%   BMI 33.43 kg/m   Physical Exam  Constitutional: She appears well-developed and well-nourished.  HENT:  Mouth/Throat: Oropharynx is clear and moist.  Eyes: Conjunctivae are normal. No scleral icterus.  Neck: Neck supple. No tracheal deviation present.  No stiffness or rigidity.   Cardiovascular: Normal rate, regular rhythm, normal heart sounds and intact distal pulses. Exam reveals no gallop and no friction rub.  No murmur heard. Pulmonary/Chest: Effort normal and breath sounds normal. No respiratory distress.  Abdominal: Soft. Normal appearance and bowel sounds are normal. She exhibits no distension and no mass. There is no tenderness. There is no guarding.  Genitourinary:  Genitourinary Comments: No cva tenderness.   Musculoskeletal: She exhibits no edema.  Neurological: She is alert.  Speech clear/fluent.   Skin: Skin is warm and dry. No rash noted.  Psychiatric:  Anxious appearing.   Nursing note and vitals reviewed.    ED Treatments / Results  Labs (all labs ordered are listed, but only abnormal results are displayed) Results for  orders placed or performed during the hospital encounter of 10/15/17  Lipase, blood  Result Value Ref Range   Lipase 34 11 - 51 U/L  Comprehensive metabolic panel  Result Value Ref Range   Sodium 139 135 - 145 mmol/L   Potassium 3.6 3.5 - 5.1 mmol/L   Chloride 105 98 - 111 mmol/L   CO2 24 22 - 32 mmol/L   Glucose, Bld 105 (H) 70 - 99 mg/dL   BUN 16 8 - 23 mg/dL   Creatinine, Ser 1.08 (H) 0.44 - 1.00 mg/dL   Calcium 9.5 8.9 - 10.3 mg/dL   Total Protein 7.7 6.5 - 8.1 g/dL   Albumin 4.4 3.5 - 5.0 g/dL   AST 22 15 - 41 U/L   ALT 21 0 - 44 U/L   Alkaline Phosphatase 59 38 - 126 U/L   Total Bilirubin 0.4 0.3 - 1.2 mg/dL   GFR calc non Af Amer 50 (L) >60 mL/min   GFR calc Af Amer 58 (L) >60 mL/min   Anion gap 10 5 - 15  CBC  Result Value Ref Range   WBC 7.5 4.0 - 10.5 K/uL   RBC 4.65 3.87 - 5.11 MIL/uL   Hemoglobin 14.5 12.0 - 15.0 g/dL   HCT 43.7 36.0 - 46.0 %   MCV 94.0 78.0 - 100.0 fL   MCH 31.2 26.0 - 34.0 pg   MCHC 33.2 30.0 - 36.0 g/dL   RDW 14.2 11.5 - 15.5 %   Platelets 302 150 - 400 K/uL  Urinalysis, Routine w reflex microscopic  Result Value Ref Range   Color, Urine YELLOW YELLOW   APPearance CLEAR CLEAR   Specific Gravity, Urine >1.030 (H) 1.005 - 1.030   pH 5.0 5.0 - 8.0   Glucose, UA NEGATIVE NEGATIVE mg/dL   Hgb urine dipstick NEGATIVE NEGATIVE   Bilirubin Urine NEGATIVE NEGATIVE   Ketones, ur NEGATIVE NEGATIVE mg/dL   Protein, ur NEGATIVE NEGATIVE mg/dL   Nitrite NEGATIVE NEGATIVE   Leukocytes, UA TRACE (A) NEGATIVE  Urinalysis, Microscopic (reflex)  Result Value Ref Range   RBC / HPF 0-5 0 - 5 RBC/hpf   WBC, UA 0-5 0 - 5 WBC/hpf  Bacteria, UA RARE (A) NONE SEEN   Squamous Epithelial / LPF 0-5 0 - 5  Troponin I  Result Value Ref Range   Troponin I <0.03 <0.03 ng/mL   Dg Inject Diag/thera/inc Needle/cath/plc Epi/lumb/sac W/img  Result Date: 10/13/2017 CLINICAL DATA:  Lumbosacral spondylosis without myelopathy. LEFT-sided radicular symptoms. Partial  improvement after the first injection. I repeated it. FLUOROSCOPY TIME:  22 seconds corresponding to a Dose Area Product of 42.74 Gy*m2 PROCEDURE: The procedure, risks, benefits, and alternatives were explained to the patient. Questions regarding the procedure were encouraged and answered. The patient understands and consents to the procedure. LUMBAR EPIDURAL INJECTION: An interlaminar approach was performed on LEFT at L1-L2. The overlying skin was cleansed and anesthetized. A 20 gauge epidural needle was advanced using loss-of-resistance technique. DIAGNOSTIC EPIDURAL INJECTION: Injection of Isovue-M 200 shows a good epidural pattern with spread above and below the level of needle placement, primarily on the LEFT; no vascular opacification is seen. THERAPEUTIC EPIDURAL INJECTION: 120.0 mg of Depo-Medrol mixed with 2 mL 1% lidocaine were instilled. The procedure was well-tolerated, and the patient was discharged thirty minutes following the injection in good condition. COMPLICATIONS: None IMPRESSION: Technically successful epidural injection on the LEFT L1-2 # 2. Electronically Signed   By: Staci Righter M.D.   On: 10/13/2017 14:42   Dg Inject Diag/thera/inc Needle/cath/plc Epi/lumb/sac W/img  Result Date: 09/18/2017 CLINICAL DATA:  Neuropathic pain, leg, bilateral. Patient complaints of both upper lumbar pain and lumbosacral pain extending into the lower extremities. 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 L1-2 epidural steroid injection is requested. FLUOROSCOPY TIME:  Radiation Exposure Index (as provided by the fluoroscopic device): 63.97 uGy*m2 Fluoroscopy Time:  26 seconds Number of Acquired Images:  0 PROCEDURE: The procedure, risks, benefits, and alternatives were explained to the patient. Questions regarding the procedure were encouraged and answered. The patient understands and consents to the procedure. LUMBAR EPIDURAL INJECTION: An interlaminar approach was performed on left at L1-2. The  overlying skin was cleansed and anesthetized. A 20 gauge epidural needle was advanced using loss-of-resistance technique. DIAGNOSTIC EPIDURAL INJECTION: Injection of Isovue-M 200 shows a good epidural pattern with spread above and below the level of needle placement, primarily on the left no vascular opacification is seen. THERAPEUTIC EPIDURAL INJECTION: 120 mg of Depo-Medrol mixed with 3 mL 1% lidocaine were instilled. The procedure was well-tolerated, and the patient was discharged thirty minutes following the injection in good condition. COMPLICATIONS: None IMPRESSION: Technically successful epidural injection on the left L1-2 # 1 Electronically Signed   By: San Morelle M.D.   On: 09/18/2017 15:35    EKG EKG Interpretation  Date/Time:  Sunday October 15 2017 19:38:28 EDT Ventricular Rate:  75 PR Interval:    QRS Duration: 86 QT Interval:  384 QTC Calculation: 429 R Axis:   58 Text Interpretation:  Sinus rhythm Nonspecific T wave abnormality No significant change since last tracing Confirmed by Lajean Saver [phone]) on 10/15/2017 7:41:06 PM   Radiology No results found.  Procedures Procedures (including critical care time)  Medications Ordered in ED Medications  metoCLOPramide (REGLAN) tablet 10 mg (has no administration in time range)  LORazepam (ATIVAN) tablet 1 mg (has no administration in time range)     Initial Impression / Assessment and Plan / ED Course  I have reviewed the triage vital signs and the nursing notes.  Pertinent labs & imaging results that were available during my care of the patient were reviewed by me and considered in my medical  decision making (see chart for details).  Labs sent.  reglan po. Po fluids. Hx anxiety, takes med for same. Ativan 1 mg po.   Reviewed nursing notes and prior charts for additional history.   Labs reviewed - trop normal. Lytes normal.   Recheck - feels much improved. Nausea resolved. Tolerating po.  No abd pain. No  chest pain.   Pt currently appears stable for d/c.     Final Clinical Impressions(s) / ED Diagnoses   Final diagnoses:  None    ED Discharge Orders    None       Lajean Saver, MD 10/15/17 2042

## 2017-10-15 NOTE — ED Notes (Signed)
Pt husband approached RN requesting Nitro for pt. Pt reassessed, EKG performed and EDP notified. See MAR. Pt reports sudden onset of left sided chest pain. 3/10

## 2017-10-15 NOTE — ED Notes (Signed)
Pt offered water for PO challenge- chose to drink personal pepsi

## 2017-10-15 NOTE — Discharge Instructions (Signed)
It was our pleasure to provide your ER care today - we hope that you feel better.  Rest. Drink plenty of fluids.  Take your zofran as need for nausea.  Follow up with primary care doctor in the next couple days for recheck if symptoms fail to improve/resolve.  Return to ER if worse, new symptoms, high fevers, persistent vomiting, new or severe pain, other concern.

## 2017-10-19 ENCOUNTER — Telehealth: Payer: Self-pay | Admitting: Osteopathic Medicine

## 2017-10-19 ENCOUNTER — Ambulatory Visit (INDEPENDENT_AMBULATORY_CARE_PROVIDER_SITE_OTHER): Payer: Medicare Other | Admitting: Osteopathic Medicine

## 2017-10-19 ENCOUNTER — Encounter: Payer: Self-pay | Admitting: Osteopathic Medicine

## 2017-10-19 DIAGNOSIS — R6883 Chills (without fever): Secondary | ICD-10-CM

## 2017-10-19 DIAGNOSIS — R232 Flushing: Secondary | ICD-10-CM | POA: Diagnosis not present

## 2017-10-19 DIAGNOSIS — F418 Other specified anxiety disorders: Secondary | ICD-10-CM

## 2017-10-19 DIAGNOSIS — L578 Other skin changes due to chronic exposure to nonionizing radiation: Secondary | ICD-10-CM

## 2017-10-19 MED ORDER — ESCITALOPRAM OXALATE 10 MG PO TABS: 10.0000 mg | ORAL_TABLET | Freq: Every day | ORAL | 0 refills | Status: DC

## 2017-10-19 MED ORDER — ESTRADIOL 1 MG PO TABS: 1.0000 mg | ORAL_TABLET | Freq: Every day | ORAL | 3 refills | Status: DC

## 2017-10-19 NOTE — Telephone Encounter (Signed)
error 

## 2017-10-19 NOTE — Progress Notes (Signed)
HPI: Carrie Salazar is a 72 y.o. female who  has a past medical history of Chronic fatigue, Depression, Fatigue (08/16/2016), Fibromyalgia, Heart attack ([place]), CABG, Hyperlipidemia, Hypertension, and Migraine.  she presents to [facility] today, 10/19/17,  for chief complaint of:  ER follow-up  Insomnia/nausea HTN  Seen 4 days ago in ER for nausea - onset 2 days after epidural spinal injection.  Given Reglan and Ativan in the ER.  No concerns on EKG, troponins negative.  Last seen here 09/21/17 for insomnia issues, we Rx alprazolam for short course, advised would not want to continue this long-term. Advised call insurance to inquire whether [employer] coverage has changed.  Patient states the alprazolam was minimally helpful.  She thinks that this point that her insomnia/sleep issues have more to do with sweats/hot flashes, depression.  She has been struggling ever since moving to this area to be closer to family, she does not have as extensive a support group/social network as she did and she struggles to make new friends here.  She became a bit tearful when talking about this today.  BP initially high on intake, better on recheck 146/84.   Has a spot on left ear of dry skin that she would like looked at, has been there for about a month or so   Past medical history, surgical history, and family history reviewed.  Current medication list and allergy/intolerance information reviewed.   (See remainder of HPI, ROS, Phys Exam below)  No results found.  No results found for this or any previous visit (from the past 72 hour(s)).   ASSESSMENT/PLAN:   Chills (without fever) - Plan: CBC, COMPLETE METABOLIC PANEL WITH GFR, TSH, Estradiol, Testosterone  Hot flashes - Gust unlikely to be truly menopausal in nature but I am okay to try relatively low-dose estrogen replacement and see if this helps - Plan: CBC, COMPLETE METABOLIC PANEL WITH GFR, TSH, Estradiol,  Testosterone  Depression with anxiety - Certainly possible that long-standing somatic symptoms have some component of mood problems behind them, a lot of her physical illness started after moving here - Plan: CBC, COMPLETE METABOLIC PANEL WITH GFR, TSH, Estradiol, Testosterone  Actinic skin damage - Cryotherapy applied, patient tolerated well   Meds ordered this encounter  Medications  . escitalopram (LEXAPRO) 10 MG tablet    Sig: Take 1 tablet (10 mg total) by mouth daily.    Dispense:  90 tablet    Refill:  0  . estradiol (ESTRACE) 1 MG tablet    Sig: Take 1 tablet (1 mg total) by mouth daily.    Dispense:  90 tablet    Refill:  3    Patient Instructions  Plan: Will try Lexapro for moods and sleep Will try estrogen supplementation for sweats/flashes, may help moods also Labs for hormone levels ordered - check w/ lab re: cost of tests Consider consultation with [facility]    Follow-up plan: Return in about 4 weeks (around 11/16/2017) for recheck moods on Lexapro, sooner if needed .                       ############################################ ############################################ ############################################ ############################################    Outpatient Encounter Medications as of 10/19/2017  Medication Sig  . ALPRAZolam (XANAX) 0.5 MG tablet Take 1 tablet (0.5 mg total) by mouth at bedtime as needed for anxiety or sleep. Every night for one week, then 1-2 times per week as needed  . aspirin EC 325  MG tablet Take 1 tablet (325 mg total) by mouth daily.  . carvedilol (COREG) 12.5 MG tablet Take 6 mg by mouth 2 (two) times daily.   . cetirizine (ZYRTEC) 5 MG tablet Take 1 tablet (5 mg total) by mouth daily.  . Cholecalciferol (VITAMIN D3) 5000 units CAPS Take 1 capsule by mouth daily.  . diphenhydrAMINE (BENADRYL) 50 MG tablet Take 1 tablet (50 mg total) by mouth once for 1 dose. 1 hour prior to  contrast administration  . estradiol (ESTRACE) 0.1 MG/GM vaginal cream INSERT 1/2 TO 1 APPLICATORFUL VAGINALLY 3 TIMES A WEEK  . FLUoxetine (PROZAC) 40 MG capsule TAKE ONE CAPSULE BY MOUTH DAILY  . gabapentin (NEURONTIN) 600 MG tablet TAKE 1 TABLET BY MOUTH IN THE MORNING, 1 TABLET IN THE AFTERNOON, AND 2 TABLETS IN THE EVENING( 1200 MG TOTAL PER DAY)  . ipratropium (ATROVENT HFA) 17 MCG/ACT inhaler Inhale 2 puffs into the lungs every 4 (four) hours as needed for wheezing.  Marland Kitchen lisinopril (PRINIVIL,ZESTRIL) 40 MG tablet Take 1 tablet (40 mg total) by mouth 2 (two) times daily.  . nitroGLYCERIN (NITROSTAT) 0.4 MG SL tablet Place 1 tablet (0.4 mg total) under the tongue every 5 (five) minutes as needed for chest pain.  Marland Kitchen ondansetron (ZOFRAN-ODT) 8 MG disintegrating tablet Take 1 tablet (8 mg total) by mouth every 8 (eight) hours as needed for nausea.  . ranolazine (RANEXA) 500 MG 12 hr tablet TAKE 1 TABLET BY MOUTH TWO  TIMES DAILY   No facility-administered encounter medications on file as of 10/19/2017.    Allergies  Allergen Reactions  . Albuterol Palpitations and Other (See Comments)    HALLUCINATIONS Also feels "funny in the head"  . Dilaudid [Hydromorphone] Anaphylaxis    Caused patient to have heart attack and crazy in the head  . Codeine Hives and Itching  . Ivp Dye [Iodinated Diagnostic Agents] Other (See Comments)    Itching and causes me to pass out  . Lanolin Hives    "eats skin off"  . Latex Dermatitis    LEAVES REALLY BAD BURN ON SKIN   . Levaquin [Levofloxacin In D5w] Other (See Comments)    "I get real dizzy and pass out"    . Metrizamide Hives, Itching, Rash and Other (See Comments)    Itching and causes me to pass out  . Sulfa Antibiotics Hives and Other (See Comments)    Itching and causes me to pass out**PT CAN TAKE BENADRYL 2 HOURS PRIOR AND IS OK**   . Nickel Rash  . Penicillins Hives  . Percocet [Oxycodone-Acetaminophen] Itching  . Tape Itching      Review  of Systems:  Constitutional: No recent illness  HEENT: No  headache, no vision change  Cardiac: No  chest pain, No  pressure, No palpitations  Respiratory:  No  shortness of breath. No  Cough  Gastrointestinal: No  abdominal pain, no change on bowel habits  Musculoskeletal: No new myalgia/arthralgia  Skin: No  Rash  Hem/Onc: No  easy bruising/bleeding, No  abnormal lumps/bumps  Neurologic: +generalized weakness, +occasiona  Dizziness  Psychiatric: +concerns with depression, No  concerns with anxiety  Exam:  BP (!) 146/84 (BP Location: Left Arm, Patient Position: Sitting, Cuff Size: Large)   Pulse 83   Temp 98.4 F (36.9 C) (Oral)   Wt 202 lb 4.8 oz (91.8 kg)   BMI 33.15 kg/m   Constitutional: VS see above. General Appearance: alert, well-developed, well-nourished, NAD  Eyes: Normal lids and conjunctive, non-icteric  sclera  Ears, Nose, Mouth, Throat: MMM, Normal external inspection ears/nares/mouth/lips/gums.  Neck: No masses, trachea midline.   Respiratory: Normal respiratory effort. no wheeze, no rhonchi, no rales  Cardiovascular: S1/S2 normal, no murmur, no rub/gallop auscultated. RRR.   Musculoskeletal: Gait normal. Symmetric and independent movement of all extremities  Neurological: Normal balance/coordination. No tremor.  Skin: warm, dry, intact.   Psychiatric: Normal judgment/insight. Normal mood and affect. Oriented x3.   Visit summary with medication list and pertinent instructions was printed for patient to review, advised to alert [place] if any changes needed. All questions at time of visit were answered - patient instructed to contact office with any additional concerns. ER/RTC precautions were reviewed with the patient and understanding verbalized.   Follow-up plan: Return in about 4 weeks (around 11/16/2017) for recheck moods on Lexapro, sooner if needed .    Please note: voice recognition software was used to produce this document, and typos may  escape review. Please contact Dr. Sheppard Coil for any needed clarifications.

## 2017-10-19 NOTE — Patient Instructions (Signed)
Plan: Will try Lexapro for moods and sleep Will try estrogen supplementation for sweats/flashes, may help moods also Labs for hormone levels ordered - check w/ lab re: cost of tests Consider consultation with [facility]

## 2017-10-20 ENCOUNTER — Encounter: Payer: Self-pay | Admitting: Osteopathic Medicine

## 2017-10-20 DIAGNOSIS — R232 Flushing: Secondary | ICD-10-CM | POA: Insufficient documentation

## 2017-10-22 LAB — COMPLETE METABOLIC PANEL WITH GFR
AG RATIO: 1.6 (calc) (ref 1.0–2.5)
ALT: 13 U/L (ref 6–29)
AST: 14 U/L (ref 10–35)
Albumin: 4.2 g/dL (ref 3.6–5.1)
Alkaline phosphatase (APISO): 62 U/L (ref 33–130)
BILIRUBIN TOTAL: 0.3 mg/dL (ref 0.2–1.2)
BUN: 12 mg/dL (ref 7–25)
CALCIUM: 9.8 mg/dL (ref 8.6–10.4)
CHLORIDE: 107 mmol/L (ref 98–110)
CO2: 25 mmol/L (ref 20–32)
Creat: 0.87 mg/dL (ref 0.60–0.93)
GFR, EST AFRICAN AMERICAN: 77 mL/min/{1.73_m2} (ref >=60)
GFR, EST NON AFRICAN AMERICAN: 66 mL/min/{1.73_m2} (ref >=60)
GLOBULIN: 2.6 g/dL (ref 1.9–3.7)
Glucose, Bld: 99 mg/dL (ref 65–99)
POTASSIUM: 3.6 mmol/L (ref 3.5–5.3)
Sodium: 140 mmol/L (ref 135–146)
TOTAL PROTEIN: 6.8 g/dL (ref 6.1–8.1)

## 2017-10-22 LAB — CBC
HEMATOCRIT: 40.4 % (ref 35.0–45.0)
HEMOGLOBIN: 13.7 g/dL (ref 11.7–15.5)
MCH: 31.1 pg (ref 27.0–33.0)
MCHC: 33.9 g/dL (ref 32.0–36.0)
MCV: 91.6 fL (ref 80.0–100.0)
MPV: 9.6 fL (ref 7.5–12.5)
Platelets: 304 10*3/uL (ref 140–400)
RBC: 4.41 10*6/uL (ref 3.80–5.10)
RDW: 12.9 % (ref 11.0–15.0)
WBC: 6.9 10*3/uL (ref 3.8–10.8)

## 2017-10-22 LAB — TSH: TSH: 1.56 mIU/L (ref 0.40–4.50)

## 2017-10-22 LAB — ESTRADIOL: Estradiol: 20 pg/mL

## 2017-10-22 LAB — TESTOSTERONE, TOTAL, LC/MS/MS: TESTOSTERONE, TOTAL, LC-MS-MS: 15 ng/dL (ref 2–45)

## 2017-10-30 ENCOUNTER — Encounter: Payer: Self-pay | Admitting: Cardiology

## 2017-10-30 ENCOUNTER — Telehealth: Payer: Self-pay | Admitting: Cardiology

## 2017-10-30 ENCOUNTER — Ambulatory Visit: Payer: Medicare Other | Admitting: Cardiology

## 2017-10-30 VITALS — BP 138/62 | HR 82 | Ht 65.0 in | Wt 202.0 lb

## 2017-10-30 DIAGNOSIS — I25810 Atherosclerosis of coronary artery bypass graft(s) without angina pectoris: Secondary | ICD-10-CM | POA: Diagnosis not present

## 2017-10-30 DIAGNOSIS — R002 Palpitations: Secondary | ICD-10-CM | POA: Diagnosis not present

## 2017-10-30 DIAGNOSIS — I10 Essential (primary) hypertension: Secondary | ICD-10-CM | POA: Diagnosis not present

## 2017-10-30 DIAGNOSIS — E7800 Pure hypercholesterolemia, unspecified: Secondary | ICD-10-CM

## 2017-10-30 DIAGNOSIS — R0602 Shortness of breath: Secondary | ICD-10-CM

## 2017-10-30 MED ORDER — ASPIRIN EC 81 MG PO TBEC: 81.0000 mg | DELAYED_RELEASE_TABLET | Freq: Every day | ORAL | Status: DC

## 2017-10-30 MED ORDER — LISINOPRIL 40 MG PO TABS: 40.0000 mg | ORAL_TABLET | Freq: Two times a day (BID) | ORAL | 3 refills | Status: DC

## 2017-10-30 MED ORDER — LISINOPRIL 40 MG PO TABS: 40.0000 mg | ORAL_TABLET | Freq: Every day | ORAL | 3 refills | Status: DC

## 2017-10-30 NOTE — Telephone Encounter (Signed)
New Message    *STAT* If patient is at the pharmacy, call can be transferred to refill team.   1. Which medications need to be refilled? (please list name of each medication and dose if known) carvedilol (COREG) 12.5 MG tablet Take 6 mg by mouth 2 (two) times daily.   2. Which pharmacy/location (including street and city if local pharmacy) is medication to be sent to? [id number] - [place],  - [address]  3. Do they need a 30 day or 90 day supply? 30 day

## 2017-10-30 NOTE — Patient Instructions (Signed)
Medication Instructions:  INCREASE LISINOPRIL TO 40 MG ONCE DAILY= 4 OF THE 10 MG TABLETS ONCE DAILY If you need a refill on your cardiac medications before your next appointment, please call your pharmacy.   Lab work: Your physician recommends that you return for lab work in: [place] If you have labs (blood work) drawn today and your tests are completely normal, you will receive your results only by: Marland Kitchen MyChart Message (if you have MyChart) OR . A paper copy in the mail If you have any lab test that is abnormal or we need to change your treatment, we will call you to review the results.  Testing/Procedures: Your physician has requested that you have a lexiscan myoview. For further information please visit [email or web address]. Please follow instruction sheet, as given.  Your physician has recommended that you wear a 24 HOUR holter monitor. Holter monitors are medical devices that record the heart's electrical activity. Doctors most often use these monitors to diagnose arrhythmias. Arrhythmias are problems with the speed or rhythm of the heartbeat. The monitor is a small, portable device. You can wear one while you do your normal daily activities. This is usually used to diagnose what is causing palpitations/syncope (passing out).     Follow-Up: Your physician recommends that you schedule a follow-up appointment in: [place]

## 2017-10-30 NOTE — Progress Notes (Signed)
HPI: FU coronary artery disease. CABG 2013; pt had PCI of PDA and PLA following CABG. Cardiac catheterization April 2016 showed severe three-vessel coronary disease, patent LIMA to the LAD, patent saphenous vein graft to the ramus intermedius, patent saphenous vein graft to the PDA with patent stents in the PDA beyond the graft insertion site, normal LV systolic function. Holter monitor January 2017 interpreted as sinus rhythm with possible atrial flutter, PACs, atrial couplets and atrial triplets. I obtained the strips from [place] and felt that it showed sinus with PAT. Anticoagulation discontinued. Nuclear study November 2017 showed ejection fraction 66% with normal perfusion.  Echocardiogram November 2019 showed normal LV function.  Since last seen,she notices increased dyspnea on exertion, heart racing with activities, some dizziness with activities.  She occasionally feels pain in the left lateral chest area which is unchanged.  She has not had syncope.  No increase in pedal edema.  Current Outpatient Medications  Medication Sig Dispense Refill  . aspirin EC 325 MG tablet Take 1 tablet (325 mg total) by mouth daily. (Patient taking differently: Take 162.5 mg by mouth daily. ) 30 tablet 0  . cetirizine (ZYRTEC) 5 MG tablet Take 1 tablet (5 mg total) by mouth daily. (Patient taking differently: Take 5 mg by mouth as needed. ) 30 tablet 0  . escitalopram (LEXAPRO) 10 MG tablet Take 1 tablet (10 mg total) by mouth daily. 90 tablet 0  . estradiol (ESTRACE) 0.1 MG/GM vaginal cream INSERT 1/2 TO 1 APPLICATORFUL VAGINALLY 3 TIMES A WEEK 42.5 g 0  . estradiol (ESTRACE) 1 MG tablet Take 1 tablet (1 mg total) by mouth daily. 90 tablet 3  . ipratropium (ATROVENT HFA) 17 MCG/ACT inhaler Inhale 2 puffs into the lungs every 4 (four) hours as needed for wheezing. 12.9 g 3  . lisinopril (PRINIVIL,ZESTRIL) 10 MG tablet Take 15 mg by mouth 2 (two) times daily.    . nitroGLYCERIN (NITROSTAT) 0.4 MG SL  tablet Place 1 tablet (0.4 mg total) under the tongue every 5 (five) minutes as needed for chest pain. 25 tablet 12  . ranolazine (RANEXA) 500 MG 12 hr tablet TAKE 1 TABLET BY MOUTH TWO  TIMES DAILY 180 tablet 3  . carvedilol (COREG) 12.5 MG tablet Take 6 mg by mouth 2 (two) times daily.   0  . diphenhydrAMINE (BENADRYL) 50 MG tablet Take 1 tablet (50 mg total) by mouth once for 1 dose. 1 hour prior to contrast administration (Patient taking differently: Take 100 mg by mouth once. 1 hour prior to contrast administration) 1 tablet 0   No current facility-administered medications for this visit.      Past Medical History:  Diagnosis Date  . Chronic fatigue   . Depression   . Fatigue 08/16/2016  . Fibromyalgia   . Heart attack ([place])   . Hx of CABG   . Hyperlipidemia   . Hypertension   . Migraine     Past Surgical History:  Procedure Laterality Date  . ABDOMINAL HYSTERECTOMY    . APPENDECTOMY    . BACK SURGERY    . BACK SURGERY     Lumbar rods  . BLADDER SURGERY    . BREAST BIOPSY Left    x 2  . BREAST EXCISIONAL BIOPSY Left    x 2  . BREAST LUMPECTOMY     left breast all benign (x2)  . CARPAL TUNNEL RELEASE Bilateral   . CORONARY ARTERY BYPASS GRAFT     Triple  .  LYMPECTOMY  1980  . TONSILLECTOMY    . VAGINA SURGERY    . WRIST SURGERY      Social History   Socioeconomic History  . Marital status: Married    Spouse name: Not on file  . Number of children: 2  . Years of education: 5  . Highest education level: 12th grade  Occupational History  . Occupation: Retired    Comment: Water quality scientist  . Financial resource strain: Not very hard  . Food insecurity:    Worry: Never true    Inability: Never true  . Transportation needs:    Medical: No    Non-medical: No  Tobacco Use  . Smoking status: Former Smoker    Last attempt to quit: 2003    Years since quitting: 16.8  . Smokeless tobacco: Never Used  Substance and Sexual Activity  . Alcohol use: Yes     Comment: 1 q wk  . Drug use: No  . Sexual activity: Not Currently    Partners: Male    Comment: Married  Lifestyle  . Physical activity:    Days per week: 0 days    Minutes per session: 0 min  . Stress: Only a little  Relationships  . Social connections:    Talks on phone: More than three times a week    Gets together: Twice a week    Attends religious service: 1 to 4 times per year    Active member of club or organization: No    Attends meetings of clubs or organizations: Never    Relationship status: Married  . Intimate partner violence:    Fear of current or ex partner: No    Emotionally abused: No    Physically abused: No    Forced sexual activity: No  Other Topics Concern  . Not on file  Social History Narrative   Lives at home w/ her husband   Right-handed   Caffeine: 2 cups per day   Going to church sometimes with daughter. Adjusting to the move and being in new place.    Family History  Problem Relation Age of Onset  . Cancer Mother   . Hyperlipidemia Father   . Hypertension Father   . Stroke Father   . Heart attack Father   . Hyperlipidemia Maternal Grandmother   . Hypertension Maternal Grandmother   . Stroke Maternal Grandmother   . Hemophilia Maternal Grandfather     ROS: no fevers or chills, productive cough, hemoptysis, dysphasia, odynophagia, melena, hematochezia, dysuria, hematuria, rash, seizure activity, orthopnea, PND, pedal edema, claudication. Remaining systems are negative.  Physical Exam: Well-developed well-nourished in no acute distress.  Skin is warm and dry.  HEENT is normal.  Neck is supple.  Chest is clear to auscultation with normal expansion.  Cardiovascular exam is regular rate and rhythm.  Abdominal exam nontender or distended. No masses palpated. Extremities show no edema. neuro grossly intact  ECG-sinus rhythm at a rate of 82.  Left atrial enlargement.  Nonspecific ST changes.  Personally reviewed  A/P  1  dyspnea-patient notes increased dyspnea on exertion.  We will arrange a [place] nuclear study for risk stratification.  2 palpitations-she notes elevated heart rate particularly at night.  This occurs on a daily basis.  Schedule 24-hour Holter monitor to further assess.  3 hypertension-blood pressure has been elevated.  Increase lisinopril to 40 mg daily.  Follow blood pressure and adjust regimen as needed.  Check potassium and renal function in 1  week.  4 hyperlipidemia-she has been intolerant to statins.  We will refer to lipid clinic for consideration of Repatha.  5 coronary artery disease-decrease aspirin to 81 mg daily.  6 chest pain-symptoms chronic; plan stress nuclear study for risk stratification.  Kirk Ruths, MD

## 2017-10-31 MED ORDER — CARVEDILOL 12.5 MG PO TABS: 6.0000 mg | ORAL_TABLET | Freq: Two times a day (BID) | ORAL | 2 refills | Status: DC

## 2017-11-01 ENCOUNTER — Ambulatory Visit: Payer: Medicare Other | Admitting: Cardiology

## 2017-11-07 DIAGNOSIS — I25810 Atherosclerosis of coronary artery bypass graft(s) without angina pectoris: Secondary | ICD-10-CM | POA: Diagnosis not present

## 2017-11-07 DIAGNOSIS — R0602 Shortness of breath: Secondary | ICD-10-CM | POA: Diagnosis not present

## 2017-11-08 ENCOUNTER — Ambulatory Visit (INDEPENDENT_AMBULATORY_CARE_PROVIDER_SITE_OTHER): Payer: Medicare Other

## 2017-11-08 ENCOUNTER — Telehealth (HOSPITAL_COMMUNITY): Payer: Self-pay

## 2017-11-08 DIAGNOSIS — R002 Palpitations: Secondary | ICD-10-CM | POA: Diagnosis not present

## 2017-11-08 NOTE — Telephone Encounter (Signed)
Encounter complete. 

## 2017-11-14 ENCOUNTER — Ambulatory Visit (HOSPITAL_COMMUNITY)
Admission: RE | Admit: 2017-11-14 | Discharge: 2017-11-14 | Disposition: A | Discharge status: Home / Self Care | Payer: Medicare Other | Source: Ambulatory Visit | Attending: Internal Medicine | Admitting: Internal Medicine

## 2017-11-14 ENCOUNTER — Other Ambulatory Visit: Payer: Self-pay | Admitting: Cardiology

## 2017-11-14 ENCOUNTER — Ambulatory Visit (INDEPENDENT_AMBULATORY_CARE_PROVIDER_SITE_OTHER): Payer: Medicare Other | Admitting: Pharmacist

## 2017-11-14 DIAGNOSIS — R0602 Shortness of breath: Secondary | ICD-10-CM

## 2017-11-14 DIAGNOSIS — I25810 Atherosclerosis of coronary artery bypass graft(s) without angina pectoris: Secondary | ICD-10-CM | POA: Diagnosis not present

## 2017-11-14 DIAGNOSIS — E7800 Pure hypercholesterolemia, unspecified: Secondary | ICD-10-CM | POA: Diagnosis not present

## 2017-11-14 LAB — MYOCARDIAL PERFUSION IMAGING
CHL CUP NUCLEAR SRS: 0
CHL CUP NUCLEAR SSS: 2
CSEPPHR: 83 {beats}/min
LV dias vol: 61 mL (ref 46–106)
LV sys vol: 17 mL
Rest HR: 63 {beats}/min
SDS: 2
TID: 1.21

## 2017-11-14 MED ORDER — REGADENOSON 0.4 MG/5ML IV SOLN
0.4000 mg | Freq: Once | INTRAVENOUS | Status: AC
  Administered 2017-11-14: 0.4 mg via INTRAVENOUS

## 2017-11-14 MED ORDER — FENOFIBRATE 48 MG PO TABS: 48.0000 mg | ORAL_TABLET | Freq: Every day | ORAL | 1 refills | Status: DC

## 2017-11-14 MED ORDER — TECHNETIUM TC 99M TETROFOSMIN IV KIT
10.5000 | PACK | Freq: Once | INTRAVENOUS | Status: AC | PRN
  Administered 2017-11-14: 10.5 via INTRAVENOUS
  Filled 2017-11-14: qty 11

## 2017-11-14 MED ORDER — TECHNETIUM TC 99M TETROFOSMIN IV KIT
31.8000 | PACK | Freq: Once | INTRAVENOUS | Status: AC | PRN
  Administered 2017-11-14: 31.8 via INTRAVENOUS
  Filled 2017-11-14: qty 32

## 2017-11-14 NOTE — Telephone Encounter (Signed)
Rx request sent to pharmacy.  

## 2017-11-14 NOTE — Patient Instructions (Addendum)
[facility] (pharmacy) Carrie Salazar/[name] [phone]  *Start taking fenobifrate 48mg  daily* *STARt paperwork for Repatha pushtronix 420mg  * *Repeat fasting blood today*   Cholesterol Cholesterol is a fat. Your body needs a small amount of cholesterol. Cholesterol (plaque) may build up in your blood vessels (arteries). That makes you more likely to have a heart attack or stroke. You cannot feel your cholesterol level. Having a blood test is the only way to find out if your level is high. Keep your test results. Work with your doctor to keep your cholesterol at a good level. What do the results mean?  Total cholesterol is how much cholesterol is in your blood.  LDL is bad cholesterol. This is the type that can build up. Try to have low LDL.  HDL is good cholesterol. It cleans your blood vessels and carries LDL away. Try to have high HDL.  Triglycerides are fat that the body can store or burn for energy. What are good levels of cholesterol?  Total cholesterol below 200.  LDL below 100 is good for people who have health risks. LDL below 70 is good for people who have very high risks.  HDL above 40 is good. It is best to have HDL of 60 or higher.  Triglycerides below 150. How can I lower my cholesterol? Diet Follow your diet program as told by your doctor.  Choose fish, white meat chicken, or [place] that is roasted or baked. Try not to eat red meat, fried foods, sausage, or lunch meats.  Eat lots of fresh fruits and vegetables.  Choose whole grains, beans, pasta, potatoes, and cereals.  Choose olive oil, corn oil, or canola oil. Only use small amounts.  Try not to eat butter, mayonnaise, shortening, or palm kernel oils.  Try not to eat foods with trans fats.  Choose low-fat or nonfat dairy foods. ? Drink skim or nonfat milk. ? Eat low-fat or nonfat yogurt and cheeses. ? Try not to drink whole milk or cream. ? Try not to eat ice cream, egg yolks, or full-fat  cheeses.  Healthy desserts include angel food cake, ginger snaps, animal crackers, hard candy, popsicles, and low-fat or nonfat frozen yogurt. Try not to eat pastries, cakes, pies, and cookies.  Exercise Follow your exercise program as told by your doctor.  Be more active. Try gardening, walking, and taking the stairs.  Ask your doctor about ways that you can be more active.  Medicine  Take over-the-counter and prescription medicines only as told by your doctor. This information is not intended to replace advice given to you by your health care provider. Make sure you discuss any questions you have with your health care provider. Document Released: 03/25/2008 Document Revised: 07/29/2015 Document Reviewed: 07/09/2015 Elsevier Interactive Patient Education  [employer].

## 2017-11-14 NOTE — Progress Notes (Signed)
Patient ID: Carrie Salazar                 DOB: 01-02-1946                    MRN: [id number]     HPI: Carrie Salazar is a 72 y.o. female patient referred to lipid clinic by Dr Stanford Breed. PMH is significant for hyperlipidemia, depression, chronic fatigue, fibromyalgia, CAD s/p CABG, and hypertension. Patient presents to clinic for potential PCSK9i initiation. Reports some concern about potential allergic reaction to Repatha and Praluent and reports allergy to latex; therefore we can only use Praleunt or Repatha Pushtronex. She more to town less than 1 year ago and still having difficulty to adapt to the city. Patient is compliance with most of her medication but stopped takin carvedilol due to intolerance.   Current Medications: none  Intolerances:  Pravastatin 20omg daily - lack response Pravastatin 40mg  daily - March/2019 dizziness and muscle pain Atorvastatin - GI symptoms and muscle pain   LDL goal: 70mg /dL  Diet: lean meat, fruits, vegetables, and fish. Working on less potatoes and pasta  Exercise: minimal ambulation with cane.   Social History: former smoker, occassional alcohol  Labs: 11/14/2017: CHO 274; TG 358; HDL 44; LDL-c 158 (no therapy) 07/25/2017: CHO 243; TG 423; HDL 44; LDL-c unable to calculate   Past Medical History:  Diagnosis Date  . Chronic fatigue   . Depression   . Fatigue 08/16/2016  . Fibromyalgia   . Heart attack ([place])   . Hx of CABG   . Hyperlipidemia   . Hypertension   . Migraine     Current Outpatient Medications on File Prior to Visit  Medication Sig Dispense Refill  . aspirin EC 81 MG tablet Take 1 tablet (81 mg total) by mouth daily.    . cetirizine (ZYRTEC) 5 MG tablet Take 1 tablet (5 mg total) by mouth daily. (Patient taking differently: Take 5 mg by mouth as needed. ) 30 tablet 0  . cholecalciferol (VITAMIN D) 1000 units tablet Take 1,000 Units by mouth daily.    . diphenhydrAMINE (BENADRYL) 50 MG tablet Take 1 tablet (50 mg total) by mouth  once for 1 dose. 1 hour prior to contrast administration (Patient taking differently: Take 100 mg by mouth once. 1 hour prior to contrast administration) 1 tablet 0  . escitalopram (LEXAPRO) 10 MG tablet Take 1 tablet (10 mg total) by mouth daily. 90 tablet 0  . estradiol (ESTRACE) 0.1 MG/GM vaginal cream INSERT 1/2 TO 1 APPLICATORFUL VAGINALLY 3 TIMES A WEEK 42.5 g 0  . estradiol (ESTRACE) 1 MG tablet Take 1 tablet (1 mg total) by mouth daily. 90 tablet 3  . gabapentin (NEURONTIN) 100 MG capsule Take 400 mg by mouth 2 (two) times daily.    Marland Kitchen ipratropium (ATROVENT HFA) 17 MCG/ACT inhaler Inhale 2 puffs into the lungs every 4 (four) hours as needed for wheezing. 12.9 g 3  . lisinopril (PRINIVIL,ZESTRIL) 40 MG tablet Take 1 tablet (40 mg total) by mouth daily. 90 tablet 3  . Multiple Vitamins-Minerals (HAIR SKIN AND NAILS FORMULA PO) Take 1 tablet by mouth 2 (two) times daily.    . nitroGLYCERIN (NITROSTAT) 0.4 MG SL tablet Place 1 tablet (0.4 mg total) under the tongue every 5 (five) minutes as needed for chest pain. 25 tablet 12  . ranolazine (RANEXA) 500 MG 12 hr tablet TAKE 1 TABLET BY MOUTH TWO  TIMES DAILY 180 tablet 3   No current facility-administered  medications on file prior to visit.     Allergies  Allergen Reactions  . Albuterol Palpitations and Other (See Comments)    HALLUCINATIONS Also feels "funny in the head"  . Dilaudid [Hydromorphone] Anaphylaxis    Caused patient to have heart attack and crazy in the head  . Codeine Hives and Itching  . Ivp Dye [Iodinated Diagnostic Agents] Other (See Comments)    Itching and causes me to pass out Must be pre-treated with benadryl.  . Lanolin Hives    "burns skin off"  . Latex Dermatitis    LEAVES REALLY BAD BURN ON SKIN   . Levaquin [Levofloxacin In D5w] Other (See Comments)    "I get real dizzy and pass out"    . Metrizamide Hives, Itching, Rash and Other (See Comments)    Itching and causes me to pass out  . Nickel Rash  .  Penicillins Hives  . Percocet [Oxycodone-Acetaminophen] Itching  . Tape Itching    With latex additives.    Hyperlipidemia LDL remains extremely elevated for secondary prevention. Patient is unable to tolerate high intensity statins due to memory problems and muscle pain. Pravastatin 20mg  lack in therapeutic response. She is willing to try Repatha Pushtronex, but will like trial before complete paperwork for insurance and co-pay. Patient is to call back and give okay to proceed with paperwork if able to tolerate sample. 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 I will initiate fenofibrate (Tricor) 48mg  daily today to manage TG. Plan to follow up with patient, by phone, in 2 weeks to determine if tolerating Repatha and fenofibrate.   Sample Repatha 420mg  Pushtronex (LOT [id number]  Exp 10/20) given to patient for self administration at home. PCSK9i indication, storage, administration, common side effects, prior-authorization process, and available patient assistance programs were discussed during this office visit.     Aavya Shafer Rodriguez-Guzman PharmD, BCPS, [place] [address] [place] [address] 11/15/2017 9:35 AM

## 2017-11-15 ENCOUNTER — Encounter: Payer: Self-pay | Admitting: Pharmacist

## 2017-11-15 LAB — LIPID PANEL
Chol/HDL Ratio: 6.2 ratio — ABNORMAL HIGH (ref 0.0–4.4)
Cholesterol, Total: 274 mg/dL — ABNORMAL HIGH (ref 100–199)
HDL: 44 mg/dL (ref >39)
LDL Calculated: 158 mg/dL — ABNORMAL HIGH (ref 0–99)
Triglycerides: 358 mg/dL — ABNORMAL HIGH (ref 0–149)
VLDL Cholesterol Cal: 72 mg/dL — ABNORMAL HIGH (ref 5–40)

## 2017-11-15 NOTE — Assessment & Plan Note (Signed)
LDL remains extremely elevated for secondary prevention. Patient is unable to tolerate high intensity statins due to memory problems and muscle pain. Pravastatin 20mg  lack in therapeutic response. She is willing to try Repatha Pushtronex, but will like trial before complete paperwork for insurance and co-pay. Patient is to call back and give okay to proceed with paperwork if able to tolerate sample. 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 I will initiate fenofibrate (Tricor) 48mg  daily today to manage TG. Plan to follow up with patient, by phone, in 2 weeks to determine if tolerating Repatha and fenofibrate.   Sample Repatha 420mg  Pushtronex (LOT [id number]  Exp 10/20) given to patient for self administration at home. PCSK9i indication, storage, administration, common side effects, prior-authorization process, and available patient assistance programs were discussed during this office visit.

## 2017-11-16 ENCOUNTER — Encounter: Payer: Self-pay | Admitting: Osteopathic Medicine

## 2017-11-16 ENCOUNTER — Ambulatory Visit (INDEPENDENT_AMBULATORY_CARE_PROVIDER_SITE_OTHER): Payer: Medicare Other | Admitting: Osteopathic Medicine

## 2017-11-16 VITALS — BP 114/73 | HR 67 | Temp 98.4°F | Wt 206.0 lb

## 2017-11-16 DIAGNOSIS — R232 Flushing: Secondary | ICD-10-CM | POA: Diagnosis not present

## 2017-11-16 DIAGNOSIS — F419 Anxiety disorder, unspecified: Secondary | ICD-10-CM | POA: Diagnosis not present

## 2017-11-16 DIAGNOSIS — Z23 Encounter for immunization: Secondary | ICD-10-CM

## 2017-11-16 DIAGNOSIS — F3341 Major depressive disorder, recurrent, in partial remission: Secondary | ICD-10-CM | POA: Diagnosis not present

## 2017-11-16 DIAGNOSIS — T85192D Other mechanical complication of implanted electronic neurostimulator (electrode) of spinal cord, subsequent encounter: Secondary | ICD-10-CM

## 2017-11-16 DIAGNOSIS — R6883 Chills (without fever): Secondary | ICD-10-CM

## 2017-11-16 NOTE — Patient Instructions (Addendum)
   Will try Lexapro 20 mg (2 tablets of the 10 mg dose)  Call me in 2-4 weeks and I can send refill of 10 or 20 mg, whichever you prefer    Will try re-sending referral to neurosurgery for stimulator

## 2017-11-16 NOTE — Progress Notes (Signed)
HPI: Carrie Salazar is a 72 y.o. female who  has a past medical history of Chronic fatigue, Depression, Fatigue (08/16/2016), Fibromyalgia, Heart attack ([place]), CABG, Hyperlipidemia, Hypertension, and Migraine.  she presents to [facility] today, 11/16/17,  for chief complaint of:  Follow-up Lexapro and estradiol  Last visit, we started Lexapro and estradiol for anxiety/depression and hot flashes, patient states she is doing a lot better on this medicine in terms of overall mood, though still not sleeping very well.  Still having problems with low back pain, we sent to pain management a while ago, does not look like she ever was seen there, there is also an issue getting a neurosurgery referral, she has a spinal cord stimulator that she wants removed.  [place]    Past medical history, surgical history, and family history reviewed.  Current medication list and allergy/intolerance information reviewed.   (See remainder of HPI, ROS, Phys Exam below)       ASSESSMENT/PLAN:   Recurrent major depressive disorder, in partial remission ([place])  Need for influenza vaccination - Plan: Flu vaccine HIGH DOSE PF (Fluzone High dose)  Anxiety  Chills (without fever)  Hot flashes  Malfunction of spinal cord stimulator, subsequent encounter - Plan: Ambulatory referral to Neurosurgery   Okay to continue estrogen and Lexapro.  Can try increasing Lexapro dose, may help with sleep.    Patient Instructions   Will try Lexapro 20 mg (2 tablets of the 10 mg dose)  Call me in 2-4 weeks and I can send refill of 10 or 20 mg, whichever you prefer    Will try re-sending referral to neurosurgery for stimulator    Follow-up plan: Return in about 6 months (around 05/17/2018) for follow-up chronic issues - please come see me if you need me sooner!  .                       ############################################ ############################################ ############################################ ############################################    Outpatient Encounter Medications as of 11/16/2017  Medication Sig Note  . aspirin EC 81 MG tablet Take 1 tablet (81 mg total) by mouth daily.   . cetirizine (ZYRTEC) 5 MG tablet Take 1 tablet (5 mg total) by mouth daily. (Patient taking differently: Take 5 mg by mouth as needed. )   . cholecalciferol (VITAMIN D) 1000 units tablet Take 1,000 Units by mouth daily.   Marland Kitchen escitalopram (LEXAPRO) 10 MG tablet Take 1 tablet (10 mg total) by mouth daily.   Marland Kitchen estradiol (ESTRACE) 0.1 MG/GM vaginal cream INSERT 1/2 TO 1 APPLICATORFUL VAGINALLY 3 TIMES A WEEK   . estradiol (ESTRACE) 1 MG tablet Take 1 tablet (1 mg total) by mouth daily.   . fenofibrate (TRICOR) 48 MG tablet TAKE 1 TABLET(48 MG) BY MOUTH DAILY   . gabapentin (NEURONTIN) 100 MG capsule Take 400 mg by mouth 2 (two) times daily.   Marland Kitchen ipratropium (ATROVENT HFA) 17 MCG/ACT inhaler Inhale 2 puffs into the lungs every 4 (four) hours as needed for wheezing. 11/16/2017: PRN  . lisinopril (PRINIVIL,ZESTRIL) 40 MG tablet Take 1 tablet (40 mg total) by mouth daily.   . Multiple Vitamins-Minerals (HAIR SKIN AND NAILS FORMULA PO) Take 1 tablet by mouth 2 (two) times daily.   . nitroGLYCERIN (NITROSTAT) 0.4 MG SL tablet Place 1 tablet (0.4 mg total) under the tongue every 5 (five) minutes as needed for chest pain.   . ranolazine (RANEXA) 500 MG 12 hr tablet  TAKE 1 TABLET BY MOUTH TWO  TIMES DAILY   . diphenhydrAMINE (BENADRYL) 50 MG tablet Take 1 tablet (50 mg total) by mouth once for 1 dose. 1 hour prior to contrast administration (Patient taking differently: Take 100 mg by mouth once. 1 hour prior to contrast administration)    No facility-administered encounter medications on file as of 11/16/2017.    Allergies  Allergen  Reactions  . Albuterol Palpitations and Other (See Comments)    HALLUCINATIONS Also feels "funny in the head"  . Dilaudid [Hydromorphone] Anaphylaxis    Caused patient to have heart attack and crazy in the head  . Codeine Hives and Itching  . Ivp Dye [Iodinated Diagnostic Agents] Other (See Comments)    Itching and causes me to pass out Must be pre-treated with benadryl.  . Lanolin Hives    "burns skin off"  . Latex Dermatitis    LEAVES REALLY BAD BURN ON SKIN   . Levaquin [Levofloxacin In D5w] Other (See Comments)    "I get real dizzy and pass out"    . Metrizamide Hives, Itching, Rash and Other (See Comments)    Itching and causes me to pass out  . Nickel Rash  . Penicillins Hives  . Percocet [Oxycodone-Acetaminophen] Itching  . Tape Itching    With latex additives.      Review of Systems:  Constitutional: No recent illness  HEENT: No  headache, no vision change  Cardiac: No  chest pain, No  pressure, No palpitations  Respiratory:  No  shortness of breath. No  Cough  Gastrointestinal: No  abdominal pain  Musculoskeletal: No new myalgia/arthralgia  Neurologic: No  weakness, No  Dizziness  Psychiatric: +concerns with depression butbetter, +concerns with anxiety  Exam:  BP 114/73 (BP Location: Left Arm, Patient Position: Sitting, Cuff Size: Large)   Pulse 67   Temp 98.4 F (36.9 C) (Oral)   Wt 206 lb (93.4 kg)   BMI 34.28 kg/m   Constitutional: VS see above. General Appearance: alert, well-developed, well-nourished, NAD  Eyes: Normal lids and conjunctive, non-icteric sclera  Ears, Nose, Mouth, Throat: MMM, Normal external inspection ears/nares/mouth/lips/gums.  Neck: No masses, trachea midline.   Respiratory: Normal respiratory effort. no wheeze, no rhonchi, no rales  Cardiovascular: S1/S2 normal, no murmur, no rub/gallop auscultated. RRR.   Musculoskeletal: Gait normal. Symmetric and independent movement of all extremities  Neurological: Normal  balance/coordination. No tremor.  Skin: warm, dry, intact.   Psychiatric: Normal judgment/insight. Normal mood and affect. Oriented x3.   Visit summary with medication list and pertinent instructions was printed for patient to review, advised to alert [place] if any changes needed. All questions at time of visit were answered - patient instructed to contact office with any additional concerns. ER/RTC precautions were reviewed with the patient and understanding verbalized.   Follow-up plan: Return in about 6 months (around 05/17/2018) for follow-up chronic issues - please come see me if you need me sooner! .  Note: Total time spent 25 minutes, greater than 50% of the visit was spent face-to-face counseling and coordinating care for the following: The primary encounter diagnosis was Recurrent major depressive disorder, in partial remission ([place]). Diagnoses of Need for influenza vaccination, Anxiety, Chills (without fever), Hot flashes, and Malfunction of spinal cord stimulator, subsequent encounter were also pertinent to this visit.Marland Kitchen  Please note: voice recognition software was used to produce this document, and typos may escape review. Please contact Dr. Sheppard Coil for any needed clarifications.

## 2017-11-23 ENCOUNTER — Telehealth: Payer: Self-pay

## 2017-11-23 MED ORDER — ESCITALOPRAM OXALATE 20 MG PO TABS: 20.0000 mg | ORAL_TABLET | Freq: Every day | ORAL | 3 refills | Status: AC

## 2017-11-23 NOTE — Telephone Encounter (Signed)
Pt advised.

## 2017-11-23 NOTE — Telephone Encounter (Signed)
Pt called with an update on taking Lexapro med. As per pt, she is feeling better taking 20 mg daily. She is requesting for provider to send in a rx for Lexapro 20mg .

## 2017-11-23 NOTE — Telephone Encounter (Signed)
OK, sent to [facility]

## 2017-11-27 DIAGNOSIS — H35371 Puckering of macula, right eye: Secondary | ICD-10-CM | POA: Diagnosis not present

## 2017-11-27 DIAGNOSIS — D3131 Benign neoplasm of right choroid: Secondary | ICD-10-CM | POA: Diagnosis not present

## 2017-12-07 ENCOUNTER — Other Ambulatory Visit: Payer: Self-pay | Admitting: Family Medicine

## 2017-12-07 DIAGNOSIS — G5793 Unspecified mononeuropathy of bilateral lower limbs: Secondary | ICD-10-CM

## 2017-12-13 ENCOUNTER — Telehealth: Payer: Self-pay

## 2017-12-13 NOTE — Telephone Encounter (Signed)
Will start process for Repatha 420mg  PA

## 2017-12-13 NOTE — Telephone Encounter (Signed)
Left message for pt regarding to call us back and let us know how the medication has been working and to let the clinic know if there are any adverse reaction. Sample was given 11/28/17 and needs PA for Repatha

## 2017-12-13 NOTE — Telephone Encounter (Signed)
Pt called and stated having some diarrhea and really tired other than that pt believes that they can take lipid injection

## 2017-12-16 ENCOUNTER — Other Ambulatory Visit: Payer: Self-pay | Admitting: Osteopathic Medicine

## 2017-12-19 NOTE — Telephone Encounter (Signed)
Previously marked as D/C I was under the impression this was stopped by neurology Can we call patient and see if she's taking it, or if she requested refill because she's srated having headaches again in which case she needs seen If not taking, needs to be cancelled at pharmacy

## 2017-12-19 NOTE — Telephone Encounter (Signed)
As per pt, taking med prn for migraines. Requesting that refill be sent to pharmacy. Pt is also requesting a stronger med for back pain. Currently taking gabapentin, works well for her legs but not for her back. Pls advise, thanks.

## 2017-12-20 NOTE — Progress Notes (Signed)
HPI: FU coronary artery disease. CABG 2013; pt had PCI of PDA and PLA following CABG. Cardiac catheterization April 2016 showed severe three-vessel coronary disease, patent LIMA to the LAD, patent saphenous vein graft to the ramus intermedius, patent saphenous vein graft to the PDA with patent stents in the PDA beyond the graft insertion site, normal LV systolic function. Echocardiogram November 2019 showed normal LV function. Holter monitor October 2019 showed sinus with PACs, PVCs and 3 beats nonsustained ventricular tachycardia.  Nuclear study November 2019 showed ejection fraction 71% and normal perfusion.  Since last seen,she denies dyspnea or chest pain.  Occasional brief palpitations not sustained.  No syncope.  Current Outpatient Medications  Medication Sig Dispense Refill  . acetaZOLAMIDE (DIAMOX) 125 MG tablet TAKE 1 TABLET(125 MG) BY MOUTH THREE TIMES DAILY 90 tablet 0  . aspirin EC 81 MG tablet Take 1 tablet (81 mg total) by mouth daily.    . cetirizine (ZYRTEC) 5 MG tablet Take 1 tablet (5 mg total) by mouth daily. (Patient taking differently: Take 5 mg by mouth as needed. ) 30 tablet 0  . cholecalciferol (VITAMIN D) 1000 units tablet Take 1,000 Units by mouth daily.    . diphenhydrAMINE (BENADRYL) 50 MG tablet Take 1 tablet (50 mg total) by mouth once for 1 dose. 1 hour prior to contrast administration (Patient taking differently: Take 100 mg by mouth once. 1 hour prior to contrast administration) 1 tablet 0  . escitalopram (LEXAPRO) 20 MG tablet Take 1 tablet (20 mg total) by mouth daily. 90 tablet 3  . estradiol (ESTRACE) 0.1 MG/GM vaginal cream INSERT 1/2 TO 1 APPLICATORFUL VAGINALLY 3 TIMES A WEEK 42.5 g 0  . estradiol (ESTRACE) 1 MG tablet Take 1 tablet (1 mg total) by mouth daily. 90 tablet 3  . Evolocumab with Infusor ([place]) 420 MG/3.5ML SOCT Inject 420 mg into the skin every 30 (thirty) days. 1 Cartridge 11  . fenofibrate (TRICOR) 48 MG tablet TAKE  1 TABLET(48 MG) BY MOUTH DAILY 90 tablet 1  . gabapentin (NEURONTIN) 600 MG tablet TAKE 1 TABLET BY MOUTH IN THE MORNING, 1 TABLET IN THE AFTERNOON, AND 2 TABLETS IN THE EVENING( 1200 MG TOTAL PER DAY) 120 tablet 0  . ipratropium (ATROVENT HFA) 17 MCG/ACT inhaler Inhale 2 puffs into the lungs every 4 (four) hours as needed for wheezing. 12.9 g 3  . lisinopril (PRINIVIL,ZESTRIL) 40 MG tablet Take 1 tablet (40 mg total) by mouth daily. 90 tablet 3  . Multiple Vitamins-Minerals (HAIR SKIN AND NAILS FORMULA PO) Take 1 tablet by mouth 2 (two) times daily.    . nitroGLYCERIN (NITROSTAT) 0.4 MG SL tablet Place 1 tablet (0.4 mg total) under the tongue every 5 (five) minutes as needed for chest pain. 25 tablet 12  . ranolazine (RANEXA) 500 MG 12 hr tablet TAKE 1 TABLET BY MOUTH TWO  TIMES DAILY 180 tablet 3   No current facility-administered medications for this visit.      Past Medical History:  Diagnosis Date  . Chronic fatigue   . Depression   . Fatigue 08/16/2016  . Fibromyalgia   . Heart attack ([place])   . Hx of CABG   . Hyperlipidemia   . Hypertension   . Migraine     Past Surgical History:  Procedure Laterality Date  . ABDOMINAL HYSTERECTOMY    . APPENDECTOMY    . BACK SURGERY    . BACK SURGERY     Lumbar rods  .  BLADDER SURGERY    . BREAST BIOPSY Left    x 2  . BREAST EXCISIONAL BIOPSY Left    x 2  . BREAST LUMPECTOMY     left breast all benign (x2)  . CARPAL TUNNEL RELEASE Bilateral   . CORONARY ARTERY BYPASS GRAFT     Triple  . LYMPECTOMY  1980  . TONSILLECTOMY    . VAGINA SURGERY    . WRIST SURGERY      Social History   Socioeconomic History  . Marital status: Married    Spouse name: Not on file  . Number of children: 2  . Years of education: 17  . Highest education level: 12th grade  Occupational History  . Occupation: Retired    Comment: Water quality scientist  . Financial resource strain: Not very hard  . Food insecurity:    Worry: Never true     Inability: Never true  . Transportation needs:    Medical: No    Non-medical: No  Tobacco Use  . Smoking status: Former Smoker    Last attempt to quit: 2003    Years since quitting: 16.9  . Smokeless tobacco: Never Used  Substance and Sexual Activity  . Alcohol use: Yes    Comment: 1 q wk  . Drug use: No  . Sexual activity: Not Currently    Partners: Male    Comment: Married  Lifestyle  . Physical activity:    Days per week: 0 days    Minutes per session: 0 min  . Stress: Only a little  Relationships  . Social connections:    Talks on phone: More than three times a week    Gets together: Twice a week    Attends religious service: 1 to 4 times per year    Active member of club or organization: No    Attends meetings of clubs or organizations: Never    Relationship status: Married  . Intimate partner violence:    Fear of current or ex partner: No    Emotionally abused: No    Physically abused: No    Forced sexual activity: No  Other Topics Concern  . Not on file  Social History Narrative   Lives at home w/ her husband   Right-handed   Caffeine: 2 cups per day   Going to church sometimes with daughter. Adjusting to the move and being in new place.    Family History  Problem Relation Age of Onset  . Cancer Mother   . Hyperlipidemia Father   . Hypertension Father   . Stroke Father   . Heart attack Father   . Hyperlipidemia Maternal Grandmother   . Hypertension Maternal Grandmother   . Stroke Maternal Grandmother   . Hemophilia Maternal Grandfather     ROS: Bilateral continuous lower extremity pain but no fevers or chills, productive cough, hemoptysis, dysphasia, odynophagia, melena, hematochezia, dysuria, hematuria, rash, seizure activity, orthopnea, PND, pedal edema, claudication. Remaining systems are negative.  Physical Exam: Well-developed obese in no acute distress.  Skin is warm and dry.  HEENT is normal.  Neck is supple.  Chest is clear to auscultation  with normal expansion.  Cardiovascular exam is regular rate and rhythm.  Abdominal exam nontender or distended. No masses palpated. Extremities show no edema. neuro grossly intact  A/P  1 coronary artery disease-plan to continue aspirin 81 mg daily.  She is intolerant to statins.  2 chest pain-she has had problems with chronic chest pain but none  recently.  Last nuclear study showed no ischemia.  We will not pursue further evaluation at this point.  3 hypertension-patient's blood pressure is controlled today.  Continue present medications and follow.  4 hyperlipidemia-patient is intolerant to statins.  Continue Repatha.  5 palpitations-previous monitor showed sinus with occasional PVCs, PACs and 3 beats of nonsustained ventricular tachycardia.  I will add Toprol 12.5 mg daily and follow.  Kirk Ruths, MD

## 2017-12-21 ENCOUNTER — Other Ambulatory Visit: Payer: Self-pay | Admitting: Osteopathic Medicine

## 2017-12-21 NOTE — Telephone Encounter (Signed)
This is not really a migraine medicine.   If she is taking it and finding relief, that's ok and we can continue it BUT she risks dropping blood pressure and electrolyte imbalance which can cause problems. We should talk more about this when she is next in the office.  New pain medicines will need office visit to discuss, sounds like we can discuss migraine prevention and treatment as well!

## 2017-12-21 NOTE — Telephone Encounter (Signed)
Any update on this?

## 2017-12-21 NOTE — Telephone Encounter (Signed)
Pt has been updated of med refill and provider's note. No other inquires during call.

## 2017-12-22 ENCOUNTER — Telehealth: Payer: Self-pay | Admitting: Pharmacist

## 2017-12-22 MED ORDER — EVOLOCUMAB WITH INFUSOR 420 MG/3.5ML ~~LOC~~ SOCT: 420.0000 mg | SUBCUTANEOUS | 11 refills | Status: DC

## 2017-12-22 NOTE — Telephone Encounter (Signed)
PA for repatha 420mg  approved by insurance.  Rx sent to prefer pharmacy. Patient instructed to call back if unable to afford medication. Patient assistance program available.

## 2017-12-26 DIAGNOSIS — H43812 Vitreous degeneration, left eye: Secondary | ICD-10-CM | POA: Diagnosis not present

## 2017-12-29 ENCOUNTER — Ambulatory Visit: Payer: Medicare Other | Admitting: Cardiology

## 2017-12-29 ENCOUNTER — Encounter: Payer: Self-pay | Admitting: Cardiology

## 2017-12-29 VITALS — BP 118/60 | HR 70 | Ht 65.0 in | Wt 210.0 lb

## 2017-12-29 DIAGNOSIS — I10 Essential (primary) hypertension: Secondary | ICD-10-CM | POA: Diagnosis not present

## 2017-12-29 DIAGNOSIS — I2510 Atherosclerotic heart disease of native coronary artery without angina pectoris: Secondary | ICD-10-CM

## 2017-12-29 DIAGNOSIS — R002 Palpitations: Secondary | ICD-10-CM | POA: Diagnosis not present

## 2017-12-29 DIAGNOSIS — E7800 Pure hypercholesterolemia, unspecified: Secondary | ICD-10-CM

## 2017-12-29 MED ORDER — METOPROLOL SUCCINATE ER 25 MG PO TB24: 12.5000 mg | ORAL_TABLET | Freq: Every day | ORAL | 3 refills | Status: DC

## 2017-12-29 NOTE — Patient Instructions (Signed)
Medication Instructions:  START METOPROLOL SUCC ER 12.5 MG ONCE DAILY AT BEDTIME= 1/2 OF A 25 MG TABLET ONCE DAILY If you need a refill on your cardiac medications before your next appointment, please call your pharmacy.   Lab work: If you have labs (blood work) drawn today and your tests are completely normal, you will receive your results only by: Marland Kitchen MyChart Message (if you have MyChart) OR . A paper copy in the mail If you have any lab test that is abnormal or we need to change your treatment, we will call you to review the results.  Follow-Up: At [facility], you and your health needs are our priority.  As part of our continuing mission to provide you with exceptional heart care, we have created designated Provider Care Teams.  These Care Teams include your primary Cardiologist (physician) and Advanced Practice Providers (APPs -  Physician Assistants and Nurse Practitioners) who all work together to provide you with the care you need, when you need it. You will need a follow up appointment in 6 months.  Please call our office 2 months in advance to schedule this appointment.  You may see Kirk Ruths, MD or one of the following Advanced Practice Providers on your designated Care Team:   Kerin Ransom, PA-C Roby Lofts, [place] . Sande Rives, PA-C

## 2018-01-02 ENCOUNTER — Telehealth: Payer: Self-pay

## 2018-01-02 NOTE — Telephone Encounter (Signed)
Called pt to ask about pass form left msg to call back

## 2018-01-08 NOTE — Telephone Encounter (Signed)
cxalled left msg regarding pt assistance form not turned in

## 2018-01-11 ENCOUNTER — Ambulatory Visit: Payer: Medicare Other | Admitting: Osteopathic Medicine

## 2018-01-11 ENCOUNTER — Other Ambulatory Visit: Payer: Self-pay | Admitting: Family Medicine

## 2018-01-11 DIAGNOSIS — G5793 Unspecified mononeuropathy of bilateral lower limbs: Secondary | ICD-10-CM

## 2018-01-16 ENCOUNTER — Ambulatory Visit (INDEPENDENT_AMBULATORY_CARE_PROVIDER_SITE_OTHER): Payer: Medicare Other | Admitting: Osteopathic Medicine

## 2018-01-16 ENCOUNTER — Encounter: Payer: Self-pay | Admitting: Osteopathic Medicine

## 2018-01-16 ENCOUNTER — Telehealth: Payer: Self-pay

## 2018-01-16 VITALS — BP 123/71 | HR 72 | Temp 98.3°F | Wt 213.0 lb

## 2018-01-16 DIAGNOSIS — G8929 Other chronic pain: Secondary | ICD-10-CM | POA: Diagnosis not present

## 2018-01-16 DIAGNOSIS — N644 Mastodynia: Secondary | ICD-10-CM | POA: Diagnosis not present

## 2018-01-16 DIAGNOSIS — M545 Low back pain, unspecified: Secondary | ICD-10-CM

## 2018-01-16 DIAGNOSIS — G4489 Other headache syndrome: Secondary | ICD-10-CM

## 2018-01-16 DIAGNOSIS — R8290 Unspecified abnormal findings in urine: Secondary | ICD-10-CM | POA: Diagnosis not present

## 2018-01-16 DIAGNOSIS — M1811 Unilateral primary osteoarthritis of first carpometacarpal joint, right hand: Secondary | ICD-10-CM

## 2018-01-16 DIAGNOSIS — Z8639 Personal history of other endocrine, nutritional and metabolic disease: Secondary | ICD-10-CM

## 2018-01-16 DIAGNOSIS — R5382 Chronic fatigue, unspecified: Secondary | ICD-10-CM

## 2018-01-16 DIAGNOSIS — N941 Unspecified dyspareunia: Secondary | ICD-10-CM

## 2018-01-16 DIAGNOSIS — T85192D Other mechanical complication of implanted electronic neurostimulator (electrode) of spinal cord, subsequent encounter: Secondary | ICD-10-CM

## 2018-01-16 LAB — POCT URINALYSIS DIPSTICK
Bilirubin, UA: NEGATIVE
Glucose, UA: NEGATIVE
Ketones, UA: NEGATIVE
Leukocytes, UA: NEGATIVE
NITRITE UA: POSITIVE
PH UA: 5 (ref 5.0–8.0)
PROTEIN UA: POSITIVE — AB
RBC UA: NEGATIVE
Urobilinogen, UA: 0.2 E.U./dL

## 2018-01-16 NOTE — Telephone Encounter (Signed)
Called pt because we are expecting the copay to be lower instructed them to go to the pharmacy to see what the copay will be if still unaffordable call the number in the letter

## 2018-01-16 NOTE — Progress Notes (Signed)
HPI: Carrie Salazar is a 73 y.o. female who  has a past medical history of Chronic fatigue, Depression, Fatigue (08/16/2016), Fibromyalgia, Heart attack ([place]), CABG, Hyperlipidemia, Hypertension, and Migraine.  she presents to [facility] today, 01/17/18,  for chief complaint of: Multiple - see below   Sore L breast for several months, right behind nipple area. Feels like might be a lump.   HLD Reports following with Dr Stanford Breed, trouble getting the Repatha medication, cardiology office is working on this.   Hyperhidrosis Reports sweating due to metoprolol, she stopped taking this medication.   Reports pain with intercourse, no dysuria/hematuria or urinary frequency but she is requesting anitbiotics - was apparently on what sounds like postcoital abx ppx for recurrent UTI in the past.   Chronic pain. Wants spinal stimulator removed, we've had difficulty getting her set up with a specialist. She reports chronic pain in lower back w/ occasional sciatica-type shooting pain down R leg. We've made several referrals to neurosurgery. Pt would like to get set up with pain management to discuss options. Tylenol and Gabapentin helping but she's worried the gabapentin is causing some brain fog and memory difficulty. Reports overall feeling better on hormone replacement.   Reports headaches are starting to become an issue again. No follow-up in place with neurology.   Reports hand arthritis worsening, she'd like to see Dr Georgina Snell again to discuss injections or whether she should see a specialist.   Concerned about her B12 levels, would like these checked.           Past medical, surgical, social and family history reviewed:  Patient Active Problem List   Diagnosis Date Noted  . Hot flashes 10/20/2017  . Presbyopia 08/08/2017  . Puckering of macula, right eye 08/08/2017  . Retinal hemorrhage, right eye 08/08/2017  . Rectal prolapse 07/25/2017  . Palpitations  02/01/2017  . Folate deficiency 02/01/2017  . History of anemia 02/01/2017  . B12 deficiency 02/01/2017  . Migraine   . Hypertension   . Hyperlipidemia   . Hx of CABG   . Heart attack ([place])   . Fibromyalgia   . Depression with anxiety   . Chronic fatigue   . Trochanteric bursitis, left hip 11/30/2016  . Degenerative arthritis of thumb, right 11/30/2016  . Fatigue 08/16/2016  . Vitamin D deficiency 08/09/2016  . Neuropathy 05/24/2016  . Elevated serum creatinine 03/31/2016  . Blood pressure instability 03/31/2016  . Other headache syndrome 02/25/2016  . SOB (shortness of breath) on exertion 01/25/2016  . Anemia 01/25/2016  . Vaginal lesion August 14, 202017  . Moderate episode of recurrent major depressive disorder ([place]) 12/10/2015  . Female cystocele 10/16/2015  . Urinary urgency 10/16/2015  . History of obstructive sleep apnea 08/20/2015  . Atrial flutter ([place]) 08/20/2015  . Coronary artery disease involving coronary bypass graft of native heart with angina pectoris ([place]) 08/13/2015  . Chronic back pain 08/13/2015  . Recurrent UTI 08/13/2015  . Insomnia 08/13/2015  . Hyperhidrosis 08/13/2015    Past Surgical History:  Procedure Laterality Date  . ABDOMINAL HYSTERECTOMY    . APPENDECTOMY    . BACK SURGERY    . BACK SURGERY     Lumbar rods  . BLADDER SURGERY    . BREAST BIOPSY Left    x 2  . BREAST EXCISIONAL BIOPSY Left    x 2  . BREAST LUMPECTOMY     left breast all benign (x2)  . CARPAL TUNNEL RELEASE Bilateral   . CORONARY  ARTERY BYPASS GRAFT     Triple  . LYMPECTOMY  1980  . TONSILLECTOMY    . VAGINA SURGERY    . WRIST SURGERY      Social History   Tobacco Use  . Smoking status: Former Smoker    Last attempt to quit: 2003    Years since quitting: 17.0  . Smokeless tobacco: Never Used  Substance Use Topics  . Alcohol use: Yes    Comment: 1 q wk    Family History  Problem Relation Age of Onset  . Cancer Mother   . Hyperlipidemia Father   .  Hypertension Father   . Stroke Father   . Heart attack Father   . Hyperlipidemia Maternal Grandmother   . Hypertension Maternal Grandmother   . Stroke Maternal Grandmother   . Hemophilia Maternal Grandfather      Current medication list and allergy/intolerance information reviewed:    Current Outpatient Medications  Medication Sig Dispense Refill  . acetaZOLAMIDE (DIAMOX) 125 MG tablet TAKE 1 TABLET(125 MG) BY MOUTH THREE TIMES DAILY 90 tablet 0  . aspirin EC 81 MG tablet Take 1 tablet (81 mg total) by mouth daily.    . cetirizine (ZYRTEC) 5 MG tablet Take 1 tablet (5 mg total) by mouth daily. (Patient taking differently: Take 5 mg by mouth as needed. ) 30 tablet 0  . cholecalciferol (VITAMIN D) 1000 units tablet Take 1,000 Units by mouth daily.    Marland Kitchen escitalopram (LEXAPRO) 20 MG tablet Take 1 tablet (20 mg total) by mouth daily. 90 tablet 3  . estradiol (ESTRACE) 1 MG tablet Take 1 tablet (1 mg total) by mouth daily. 90 tablet 3  . Evolocumab with Infusor (Pisek) 420 MG/3.5ML SOCT Inject 420 mg into the skin every 30 (thirty) days. 1 Cartridge 11  . fenofibrate (TRICOR) 48 MG tablet TAKE 1 TABLET(48 MG) BY MOUTH DAILY 90 tablet 1  . gabapentin (NEURONTIN) 600 MG tablet TAKE 1 TABLET BY MOUTH IN THE MORNING, 1 TABLET IN THE AFTERNOON, AND 2 TABLETS IN THE EVENING( 1200 MG TOTAL PER DAY) 120 tablet 0  . ipratropium (ATROVENT HFA) 17 MCG/ACT inhaler Inhale 2 puffs into the lungs every 4 (four) hours as needed for wheezing. 12.9 g 3  . lisinopril (PRINIVIL,ZESTRIL) 40 MG tablet Take 1 tablet (40 mg total) by mouth daily. 90 tablet 3  . Multiple Vitamins-Minerals (HAIR SKIN AND NAILS FORMULA PO) Take 1 tablet by mouth 2 (two) times daily.    . nitroGLYCERIN (NITROSTAT) 0.4 MG SL tablet Place 1 tablet (0.4 mg total) under the tongue every 5 (five) minutes as needed for chest pain. 25 tablet 12  . ranolazine (RANEXA) 500 MG 12 hr tablet TAKE 1 TABLET BY MOUTH TWO  TIMES DAILY  180 tablet 3  . diphenhydrAMINE (BENADRYL) 50 MG tablet Take 1 tablet (50 mg total) by mouth once for 1 dose. 1 hour prior to contrast administration (Patient not taking: Reported on 01/16/2018) 1 tablet 0  . estradiol (ESTRACE) 0.1 MG/GM vaginal cream INSERT 1/2 TO 1 APPLICATORFUL VAGINALLY 3 TIMES A WEEK (Patient not taking: Reported on 01/16/2018) 42.5 g 0  . metoprolol succinate (TOPROL XL) 25 MG 24 hr tablet Take 0.5 tablets (12.5 mg total) by mouth daily. (Patient not taking: Reported on 01/16/2018) 45 tablet 3   No current facility-administered medications for this visit.     Allergies  Allergen Reactions  . Albuterol Palpitations and Other (See Comments)    HALLUCINATIONS Also feels "funny  in the head"  . Dilaudid [Hydromorphone] Anaphylaxis    Caused patient to have heart attack and crazy in the head  . Codeine Hives and Itching  . Ivp Dye [Iodinated Diagnostic Agents] Other (See Comments)    Itching and causes me to pass out Must be pre-treated with benadryl.  . Lanolin Hives    "burns skin off"  . Latex Dermatitis    LEAVES REALLY BAD BURN ON SKIN   . Levaquin [Levofloxacin In D5w] Other (See Comments)    "I get real dizzy and pass out"    . Metrizamide Hives, Itching, Rash and Other (See Comments)    Itching and causes me to pass out  . Nickel Rash  . Penicillins Hives  . Percocet [Oxycodone-Acetaminophen] Itching  . Tape Itching    With latex additives.      Review of Systems:  Constitutional:  No  fever, no chills   HEENT: +headache, no vision change, no hearing change, No sore throat, No  sinus pressure  Cardiac: No  chest pain, No  pressure, No palpitations, No  Orthopnea  Respiratory:  No  shortness of breath. No  Cough  Gastrointestinal: No  abdominal pain, No  nausea  Musculoskeletal: No new myalgia/arthralgia  Skin: No  Rash  Genitourinary: No  abnormal genital bleeding, No abnormal genital discharge  Hem/Onc: No  easy  bruising/bleeding  Endocrine: No cold intolerance,  No heat intolerance. No polyuria/polydipsia/polyphagia   Neurologic: No  weakness, No  dizziness, No  slurred speech/focal weakness/facial droop   Psychiatric: +concerns with depression, No  concerns with anxiety, No sleep problems, No mood problems    Exam:  BP 123/71 (BP Location: Left Arm, Patient Position: Sitting, Cuff Size: Normal)   Pulse 72   Temp 98.3 F (36.8 C) (Oral)   Wt 213 lb (96.6 kg)   BMI 35.45 kg/m   Constitutional: VS see above. General Appearance: alert, well-developed, well-nourished, NAD  Eyes: Normal lids and conjunctive, non-icteric sclera  Ears, Nose, Mouth, Throat: MMM, Normal external inspection ears/nares/mouth/lips/gums.   Neck: No masses, trachea midline. No thyroid enlargement. No tenderness/mass appreciated. No lymphadenopathy  Respiratory: Normal respiratory effort. no wheeze, no rhonchi, no rales  Cardiovascular: S1/S2 normal, no murmur, no rub/gallop auscultated. RRR. No lower extremity edema.   Gastrointestinal: Nontender, no masses. No hepatomegaly, no splenomegaly.   Musculoskeletal: Gait normal. No clubbing/cyanosis of digits.   Neurological: Normal balance/coordination. No tremor. No cranial nerve deficit on limited exam. Motor and sensation intact and symmetric. Cerebellar reflexes intact.   Skin: warm, intact. No rash/ulcer. No concerning nevi or subq nodules on limited exam.    Psychiatric: Normal judgment/insight. Normal mood and affect. Oriented x3.   BREAST: No rashes/skin changes, normal fibrous breast tissue, no masses but significant tenderness deep to L nipple area, normal nipple without discharge, normal axilla    Results for orders placed or performed in visit on 01/16/18 (from the past 72 hour(s))  POCT Urinalysis Dipstick     Status: Abnormal   Collection Time: 01/16/18  4:27 PM  Result Value Ref Range   Color, UA brown    Clarity, UA clear    Glucose, UA  Negative Negative   Bilirubin, UA Negative    Ketones, UA Negative    Spec Grav, UA >=1.030 (A) 1.010 - 1.025   Blood, UA Negative    pH, UA 5.0 5.0 - 8.0   Protein, UA Positive (A) Negative   Urobilinogen, UA 0.2 0.2 or 1.0 E.U./dL  Nitrite, UA Positive    Leukocytes, UA Negative Negative   Appearance     Odor      No results found.   ASSESSMENT/PLAN: The primary encounter diagnosis was Pain of left breast. Diagnoses of Malfunction of spinal cord stimulator, subsequent encounter, Degenerative arthritis of thumb, right, Chronic bilateral low back pain without sciatica, Chronic fatigue, History of non anemic vitamin B12 deficiency, Dyspareunia in female, and Other headache syndrome were also pertinent to this visit.  Pain of left breast - will get set up with breast center for imaging - Plan: MM Digital Diagnostic Unilat L, US BREAST LTD UNI LEFT INC AXILLA  Malfunction of spinal cord stimulator, subsequent encounter - will get her set up with pain management, I'm not sure the stimulator NEEDS to come out, will seek expert recs - Plan: Ambulatory referral to Pain Clinic  Degenerative arthritis of thumb, right - follow up with Dr Georgina Snell - Plan: Ambulatory referral to Pain Clinic  Chronic bilateral low back pain without sciatica - Plan: Ambulatory referral to Pain Clinic, Urinalysis, microscopic only, Urine Culture, POCT Urinalysis Dipstick  Chronic fatigue - feels some improvement on HRT, continue this unless abnormal breast imaging - Plan: CBC, COMPLETE METABOLIC PANEL WITH GFR, TSH, Vitamin B12  History of non anemic vitamin B12 deficiency - Plan: CBC, COMPLETE METABOLIC PANEL WITH GFR, TSH, Vitamin B12  Dyspareunia in female - advised patient that dyspareunia doesn't warrant abx , UA nitrites concerning but in absence of other sx will await cx since this has been a problem chronically  Other headache syndrome - advised follow up with neuro, no alarm symptoms to warrant urgent  imaging     Orders Placed This Encounter  Procedures  . Urine Culture  . MM Digital Diagnostic Unilat L  . US BREAST LTD UNI LEFT INC AXILLA  . CBC  . COMPLETE METABOLIC PANEL WITH GFR  . TSH  . Vitamin B12  . Urinalysis, microscopic only  . Ambulatory referral to Pain Clinic  . POCT Urinalysis Dipstick       Patient Instructions  Plan:  Will get labs to check up on B12 and a few other things  Referral in place to pain management Can take Tylenol/acetaminophen up to 1000 mg four times per day MAX  Please schedule a visit w/ Dr. Georgina Snell to evaluate hand pain  Will get urine culture to see if infection needs treated, if no infection will consider vaginal estrogen treatment, try using silicone based lubricant instead of water-based lubricant if needed            Visit summary with medication list and pertinent instructions was printed for patient to review. All questions at time of visit were answered - patient instructed to contact office with any additional concerns or updates. ER/RTC precautions were reviewed with the patient.   Total time spent 40 mis, >50% of visit counseling/coordinating care for above dx in A/P   Please note: voice recognition software was used to produce this document, and typos may escape review. Please contact Dr. Sheppard Coil for any needed clarifications.     Follow-up plan: Return if symptoms worsen or fail to improve.

## 2018-01-16 NOTE — Patient Instructions (Signed)
Plan:  Will get labs to check up on B12 and a few other things  Referral in place to pain management Can take Tylenol/acetaminophen up to 1000 mg four times per day MAX  Please schedule a visit w/ Dr. Georgina Snell to evaluate hand pain  Will get urine culture to see if infection needs treated, if no infection will consider vaginal estrogen treatment, try using silicone based lubricant instead of water-based lubricant if needed

## 2018-01-17 LAB — URINALYSIS, MICROSCOPIC ONLY
BACTERIA UA: NONE SEEN /HPF
Hyaline Cast: NONE SEEN /LPF
WBC UA: NONE SEEN /HPF (ref 0–5)

## 2018-01-17 LAB — URINE CULTURE
MICRO NUMBER:: 21959
SPECIMEN QUALITY:: ADEQUATE

## 2018-01-22 ENCOUNTER — Other Ambulatory Visit: Payer: Self-pay | Admitting: Osteopathic Medicine

## 2018-01-23 ENCOUNTER — Telehealth: Payer: Self-pay

## 2018-01-23 MED ORDER — TRAMADOL HCL 50 MG PO TABS: 100.0000 mg | ORAL_TABLET | Freq: Three times a day (TID) | ORAL | 0 refills | Status: DC

## 2018-01-23 NOTE — Telephone Encounter (Signed)
Refill sent  PDMP reviewed, last filled 09/13/17

## 2018-01-23 NOTE — Telephone Encounter (Signed)
Pt called requesting med refill for tramadol. Rx on inactive med list. Requesting rx be sent to [facility]. Pls advise, thanks.

## 2018-01-24 DIAGNOSIS — Z8639 Personal history of other endocrine, nutritional and metabolic disease: Secondary | ICD-10-CM | POA: Diagnosis not present

## 2018-01-24 DIAGNOSIS — R5382 Chronic fatigue, unspecified: Secondary | ICD-10-CM | POA: Diagnosis not present

## 2018-01-25 LAB — TSH: TSH: 2.22 mIU/L (ref 0.40–4.50)

## 2018-01-25 LAB — COMPLETE METABOLIC PANEL WITH GFR
AG Ratio: 1.6 (calc) (ref 1.0–2.5)
ALKALINE PHOSPHATASE (APISO): 51 U/L (ref 33–130)
ALT: 22 U/L (ref 6–29)
AST: 30 U/L (ref 10–35)
Albumin: 3.9 g/dL (ref 3.6–5.1)
BUN/Creatinine Ratio: 13 (calc) (ref 6–22)
BUN: 15 mg/dL (ref 7–25)
CALCIUM: 9.4 mg/dL (ref 8.6–10.4)
CO2: 30 mmol/L (ref 20–32)
CREATININE: 1.12 mg/dL — AB (ref 0.60–0.93)
Chloride: 103 mmol/L (ref 98–110)
GFR, EST NON AFRICAN AMERICAN: 48 mL/min/{1.73_m2} — AB (ref >=60)
GFR, Est African American: 56 mL/min/{1.73_m2} — ABNORMAL LOW (ref >=60)
GLUCOSE: 104 mg/dL — AB (ref 65–99)
Globulin: 2.4 g/dL (calc) (ref 1.9–3.7)
Potassium: 4.4 mmol/L (ref 3.5–5.3)
SODIUM: 140 mmol/L (ref 135–146)
Total Bilirubin: 0.3 mg/dL (ref 0.2–1.2)
Total Protein: 6.3 g/dL (ref 6.1–8.1)

## 2018-01-25 LAB — CBC
HEMATOCRIT: 35 % (ref 35.0–45.0)
Hemoglobin: 11.9 g/dL (ref 11.7–15.5)
MCH: 31.7 pg (ref 27.0–33.0)
MCHC: 34 g/dL (ref 32.0–36.0)
MCV: 93.3 fL (ref 80.0–100.0)
MPV: 9.6 fL (ref 7.5–12.5)
PLATELETS: 264 10*3/uL (ref 140–400)
RBC: 3.75 10*6/uL — ABNORMAL LOW (ref 3.80–5.10)
RDW: 13 % (ref 11.0–15.0)
WBC: 5.3 10*3/uL (ref 3.8–10.8)

## 2018-01-25 LAB — VITAMIN B12: Vitamin B-12: >2000 pg/mL — ABNORMAL HIGH (ref 200–1100)

## 2018-01-26 ENCOUNTER — Ambulatory Visit: Payer: Medicare Other

## 2018-01-26 ENCOUNTER — Ambulatory Visit
Admission: RE | Admit: 2018-01-26 | Discharge: 2018-01-26 | Disposition: A | Discharge status: Home / Self Care | Payer: Medicare Other | Source: Ambulatory Visit | Attending: Osteopathic Medicine | Admitting: Osteopathic Medicine

## 2018-01-26 DIAGNOSIS — R928 Other abnormal and inconclusive findings on diagnostic imaging of breast: Secondary | ICD-10-CM | POA: Diagnosis not present

## 2018-01-30 DIAGNOSIS — H26492 Other secondary cataract, left eye: Secondary | ICD-10-CM | POA: Diagnosis not present

## 2018-01-30 DIAGNOSIS — D3130 Benign neoplasm of unspecified choroid: Secondary | ICD-10-CM | POA: Diagnosis not present

## 2018-01-30 DIAGNOSIS — H04123 Dry eye syndrome of bilateral lacrimal glands: Secondary | ICD-10-CM | POA: Diagnosis not present

## 2018-01-30 DIAGNOSIS — H43812 Vitreous degeneration, left eye: Secondary | ICD-10-CM | POA: Diagnosis not present

## 2018-02-07 ENCOUNTER — Encounter: Payer: Self-pay | Admitting: Physical Medicine & Rehabilitation

## 2018-02-09 DIAGNOSIS — H43812 Vitreous degeneration, left eye: Secondary | ICD-10-CM | POA: Diagnosis not present

## 2018-02-14 ENCOUNTER — Other Ambulatory Visit: Payer: Self-pay

## 2018-02-14 DIAGNOSIS — G5793 Unspecified mononeuropathy of bilateral lower limbs: Secondary | ICD-10-CM

## 2018-02-14 MED ORDER — GABAPENTIN 600 MG PO TABS: ORAL_TABLET | ORAL | 3 refills | Status: DC

## 2018-02-21 ENCOUNTER — Encounter: Payer: Self-pay | Admitting: Osteopathic Medicine

## 2018-02-21 ENCOUNTER — Ambulatory Visit (INDEPENDENT_AMBULATORY_CARE_PROVIDER_SITE_OTHER): Payer: Medicare Other | Admitting: Osteopathic Medicine

## 2018-02-21 VITALS — BP 122/75 | HR 79 | Temp 98.1°F | Wt 209.4 lb

## 2018-02-21 DIAGNOSIS — R197 Diarrhea, unspecified: Secondary | ICD-10-CM

## 2018-02-21 DIAGNOSIS — R1032 Left lower quadrant pain: Secondary | ICD-10-CM | POA: Diagnosis not present

## 2018-02-21 MED ORDER — METRONIDAZOLE 500 MG PO TABS: 500.0000 mg | ORAL_TABLET | Freq: Three times a day (TID) | ORAL | 0 refills | Status: AC

## 2018-02-21 MED ORDER — CIPROFLOXACIN HCL 500 MG PO TABS: 500.0000 mg | ORAL_TABLET | Freq: Two times a day (BID) | ORAL | 0 refills | Status: AC

## 2018-02-21 MED ORDER — ONDANSETRON 8 MG PO TBDP: 8.0000 mg | ORAL_TABLET | Freq: Three times a day (TID) | ORAL | 3 refills | Status: DC | PRN

## 2018-02-21 NOTE — Patient Instructions (Signed)
I think your symptoms are most likely due to colitis, we will also get testing to evaluate for possible C. difficile.  Please take antibiotics as directed, I also sent nausea medication just in case she needed.  Please see below for information on diet.  While you are ill, we should try to stick to a clear liquid diet and slowly progressed to a full liquid diet and then a bland diet as you start to feel better.  Will get CT scan if no better or if worse.       Clear Liquid Diet, Adult A clear liquid diet is a diet that includes only liquids and semi-liquids that you can see through. You do not eat any food on this diet. Most people need to follow this diet for only a short period of time. You may need to follow a clear liquid diet if:  You develop a medical condition right before or after you have surgery.  You were not able to eat food for a long period of time.  You had a condition that gave you nausea, vomiting, or diarrhea.  You are going to have a procedure or exam to look at parts of your digestive system.  You are going to have bowel surgery. The usual goals of this diet are:  To rest the stomach and digestive system as much as possible.  To help you clear the digestive system before a procedure or exam.  To keep you hydrated.  To make sure you get some calories for energy.  To help you return to your normal eating routine. What are tips for following this plan?  A clear liquid is a liquid or semi-liquid, such as gelatin, that you can see through when you hold it up to a light.  A clear liquid diet does not provide all the nutrients that you need. It is important to choose a variety of the liquids or semi-liquids that are allowed on this diet. That way, you will get as many nutrients as possible.  If you are not sure whether you can have certain items, ask your health care provider. If you have difficulty swallowing thin liquids, you will need to thicken them to prevent  breathing in (aspiration). What foods should I eat?   Water and flavored water.  Fruit juices that do not have pulp, such as cranberry juice and apple juice.  Tea and coffee without milk or cream.  Clear bouillon or broth.  Broth-based soups that have been strained.  Flavored gelatin.  Honey.  Sugar water.  Ice or frozen ice pops that do not contain milk, yogurt, fruit pieces, or fruit pulp.  Clear sodas.  Clear sports drinks. The items listed above may not be a complete list of recommended foods and beverages. Contact a dietitian for more information. What food should I avoid?  Juices that have pulp.  Milk.  Cream or cream-based soups.  Yogurt.  Regular foods that are not clear liquids or semi-liquids. The items listed above may not be a complete list of foods and beverages to avoid. Contact a dietitian for more information. Questions to ask your health care provider:  How long do I need to follow this diet?  Are there any medicines that I should change while on this diet? Summary  A clear liquid diet is a diet that includes liquids and semi-liquids that you can see through.  The goal of this diet is to help you recover by resting your digestive system, keeping you  hydrated, and providing nutrients.  Avoid liquids with milk, cream, or pulp while on this diet. This information is not intended to replace advice given to you by your health care provider. Make sure you discuss any questions you have with your health care provider. Document Released: 12/27/2004 Document Revised: 06/19/2017 Document Reviewed: 06/19/2017 Elsevier Interactive Patient Education  2019 [employer]    Full Liquid Diet A full liquid diet refers to fluids and foods that are liquid or will become liquid at room temperature. This diet should only be used for a short period of time to help you recover from illness or surgery. Your health care provider or dietitian will help you determine  when it is safe to eat regular foods. What are tips for following this plan?     Reading food labels  Check food labels of nutrition shakes for the amount of protein. Look for nutrition shakes that have at least 8-10 grams of protein in each serving.  Look for drinks, such as milks and juices, that are "fortified" or "enriched." This means that vitamins and minerals have been added. Shopping  Buy premade nutritional shakes to keep on hand.  To vary your choices, buy different flavors of milks and shakes. Meal planning  Choose flavors and foods that you enjoy.  To make sure you get enough energy from food (calories): ? Eat 3 full liquid meals each day. Have a liquid snack between each meal. ? Drink 6-8 ounces (177-237 ml) of a nutrition supplement shake with meals or as snacks. ? Add protein powder, powdered milk, milk, or yogurt to shakes to increase the amount of protein.  Drink at least one serving a day of citrus fruit juice or fruit juice that has vitamin C added. General guidelines  Before starting the full-liquid diet, check with your health care provider to know what foods you should avoid. These may include full-fat or high-fiber liquids.  You may have any liquid or food that becomes a liquid at room temperature. The food is considered a liquid if it can be poured off a spoon at room temperature.  Do not drink alcohol unless approved by your health care provider.  This diet gives you most of the nutrients that you need for energy, but you may not get enough of certain vitamins, minerals, and fiber. Make sure to talk to your health care provider or dietitian about: ? How many calories you need to eat get day. ? How much fluid you should have each day. ? Taking a multivitamin or a nutritional supplement. What foods are allowed? The items listed may not be a complete list. Talk with your dietitian about what dietary choices are best for you. Grains Thin hot cereal, such  as cream of wheat. Soft-cooked pasta or rice pured in soup. Vegetables Pulp-free tomato or vegetable juice. Vegetables pured in soup. Fruits Fruit juice without pulp. Strained fruit pures (seeds and skins removed). Meats and other protein foods Beef, chicken, and fish broths. Powdered protein supplements. Dairy Milk and milk-based beverages, including milk shakes and instant breakfast mixes. Smooth yogurt. Pured cottage cheese. Beverages Water. Coffee and tea (caffeinated or decaffeinated). Cocoa. Liquid nutritional supplements. Soft drinks. Nondairy milks, such as almond, coconut, rice, or soy milk. Fats and oils Melted margarine and butter. Cream. Canola, almond, avocado, corn, grapeseed, sunflower, and sesame oils. Gravy. Sweets and desserts Custard. Pudding. Flavored gelatin. Smooth ice cream (without nuts or candy pieces). Sherbet. Popsicles. [place] ice. Pudding pops. Seasoning and other foods Salt  and pepper. Spices. Cocoa powder. Vinegar. Ketchup. Yellow mustard. Smooth sauces, such as [place], cheese sauce, or white sauce. Soy sauce. Cream soups. Strained soups. Syrup. Honey. Jelly (without fruit pieces). What foods are not allowed? The items listed may not be a complete list. Talk with your dietitian about what dietary choices are best for you. Grains Whole grains. Pasta. Rice. Cold cereal. Bread. Crackers. Vegetables All whole fresh, frozen, or canned vegetables. Fruits All whole fresh, frozen, or canned fruits. Meats and other protein foods All cuts of meat, poultry, and fish. Eggs. Tofu and soy protein. Nuts and nut butters. Lunch meat. Sausage. Dairy Hard cheese. Yogurt with fruit chunks. Fats and oils Coconut oil. Palm oil. Lard. Cold butter. Sweets and desserts Ice cream or other frozen desserts that have any solids in them or on top, such as nuts, chocolate chips, and pieces of cookies. Cakes. Cookies. Candy. Seasoning and other foods Stone-ground mustards.  Soups with chunks or pieces. Summary  A full liquid diet refers to fluids and foods that are liquid or will become liquid at room temperature.  This diet should only be used for a short period of time to help you recover from illness or surgery. Ask your health care provider or dietitian when it is safe for you to eat regular foods.  To make sure you get enough calories and nutrients, eat 3 meals each day with snacks between. Drink premade nutrition supplement shakes or add protein powder to homemade shakes. Take a vitamin and mineral supplement as told by your health care provider. This information is not intended to replace advice given to you by your health care provider. Make sure you discuss any questions you have with your health care provider. Document Released: 12/27/2004 Document Revised: 02/10/2016 Document Reviewed: 02/10/2016 Elsevier Interactive Patient Education  2019 [place] Diet A bland diet consists of foods that are often soft and do not have a lot of fat, fiber, or extra seasonings. Foods without fat, fiber, or seasoning are easier for the body to digest. They are also less likely to irritate your mouth, throat, stomach, and other parts of your digestive system. A bland diet is sometimes called a BRAT diet. What is my plan? Your health care provider or food and nutrition specialist (dietitian) may recommend specific changes to your diet to prevent symptoms or to treat your symptoms. These changes may include:  Eating small meals often.  Cooking food until it is soft enough to chew easily.  Chewing your food well.  Drinking fluids slowly.  Not eating foods that are very spicy, sour, or fatty.  Not eating citrus fruits, such as oranges and grapefruit. What do I need to know about this diet?  Eat a variety of foods from the bland diet food list.  Do not follow a bland diet longer than needed.  Ask your health care provider whether you should take  vitamins or supplements. What foods can I eat? Grains  Hot cereals, such as cream of wheat. Rice. Bread, crackers, or tortillas made from refined white flour. Vegetables Canned or cooked vegetables. Mashed or boiled potatoes. Fruits  Bananas. Applesauce. Other types of cooked or canned fruit with the skin and seeds removed, such as canned peaches or pears. Meats and other proteins  Scrambled eggs. Creamy peanut butter or other nut butters. Lean, well-cooked meats, such as chicken or fish. Tofu. Soups or broths. Dairy Low-fat dairy products, such as milk, cottage cheese, or yogurt. Beverages  Water.  Herbal tea. Apple juice. Fats and oils Mild salad dressings. Canola or olive oil. Sweets and desserts Pudding. Custard. Fruit gelatin. Ice cream. The items listed above may not be a complete list of recommended foods and beverages. Contact a dietitian for more options. What foods are not recommended? Grains Whole grain breads and cereals. Vegetables Raw vegetables. Fruits Raw fruits, especially citrus, berries, or dried fruits. Dairy Whole fat dairy foods. Beverages Caffeinated drinks. Alcohol. Seasonings and condiments Strongly flavored seasonings or condiments. Hot sauce. Salsa. Other foods Spicy foods. Fried foods. Sour foods, such as pickled or fermented foods. Foods with high sugar content. Foods high in fiber. The items listed above may not be a complete list of foods and beverages to avoid. Contact a dietitian for more information. Summary  A bland diet consists of foods that are often soft and do not have a lot of fat, fiber, or extra seasonings.  Foods without fat, fiber, or seasoning are easier for the body to digest.  Check with your health care provider to see how long you should follow this diet plan. It is not meant to be followed for long periods. This information is not intended to replace advice given to you by your health care provider. Make sure you discuss  any questions you have with your health care provider. Document Released: 04/20/2015 Document Revised: 01/25/2017 Document Reviewed: 01/25/2017 Elsevier Interactive Patient Education  2019 [employer].

## 2018-02-21 NOTE — Progress Notes (Signed)
HPI: Carrie Salazar is a 73 y.o. female who  has a past medical history of Chronic fatigue, Depression, Fatigue (08/16/2016), Fibromyalgia, Heart attack ([place]), CABG, Hyperlipidemia, Hypertension, and Migraine.  she presents to [facility] today, 02/21/18,  for chief complaint of: Sick - Gastrointestinal   . Watery stool, last BM this AM, wasn't as bad as it's been.  . Duration: 7 days . L lower abdominal pain/cramping is worse w/ deep breaths.  . Pt is worried about C. Difficile. Also worried about spleen since her grandfather died of leukemia.       Past medical, surgical, social and family history reviewed and updated as necessary.   Current medication list and allergy/intolerance information reviewed:    Current Outpatient Medications  Medication Sig Dispense Refill  . acetaZOLAMIDE (DIAMOX) 125 MG tablet TAKE 1 TABLET(125 MG) BY MOUTH THREE TIMES DAILY 90 tablet 0  . aspirin EC 81 MG tablet Take 1 tablet (81 mg total) by mouth daily.    . cetirizine (ZYRTEC) 5 MG tablet Take 1 tablet (5 mg total) by mouth daily. (Patient taking differently: Take 5 mg by mouth as needed. ) 30 tablet 0  . cholecalciferol (VITAMIN D) 1000 units tablet Take 1,000 Units by mouth daily.    Marland Kitchen escitalopram (LEXAPRO) 20 MG tablet Take 1 tablet (20 mg total) by mouth daily. 90 tablet 3  . estradiol (ESTRACE) 0.1 MG/GM vaginal cream INSERT 1/2 TO 1 APPLICATORFUL VAGINALLY 3 TIMES A WEEK 42.5 g 0  . estradiol (ESTRACE) 1 MG tablet Take 1 tablet (1 mg total) by mouth daily. 90 tablet 3  . Evolocumab with Infusor ([place]) 420 MG/3.5ML SOCT Inject 420 mg into the skin every 30 (thirty) days. 1 Cartridge 11  . fenofibrate (TRICOR) 48 MG tablet TAKE 1 TABLET(48 MG) BY MOUTH DAILY 90 tablet 1  . gabapentin (NEURONTIN) 600 MG tablet TAKE 1 TABLET BY MOUTH IN THE MORNING, 1 TABLET IN THE AFTERNOON, AND 2 TABLETS IN THE EVENING( 1200 MG TOTAL PER DAY) 120 tablet 3  .  ipratropium (ATROVENT HFA) 17 MCG/ACT inhaler Inhale 2 puffs into the lungs every 4 (four) hours as needed for wheezing. 12.9 g 3  . lisinopril (PRINIVIL,ZESTRIL) 40 MG tablet Take 1 tablet (40 mg total) by mouth daily. 90 tablet 3  . metoprolol succinate (TOPROL XL) 25 MG 24 hr tablet Take 0.5 tablets (12.5 mg total) by mouth daily. 45 tablet 3  . Multiple Vitamins-Minerals (HAIR SKIN AND NAILS FORMULA PO) Take 1 tablet by mouth 2 (two) times daily.    . nitroGLYCERIN (NITROSTAT) 0.4 MG SL tablet Place 1 tablet (0.4 mg total) under the tongue every 5 (five) minutes as needed for chest pain. 25 tablet 12  . ranolazine (RANEXA) 500 MG 12 hr tablet TAKE 1 TABLET BY MOUTH TWO  TIMES DAILY 180 tablet 3  . traMADol (ULTRAM) 50 MG tablet Take 2 tablets (100 mg total) by mouth 3 (three) times daily for 30 days. As needed for severe pain. Try to limit use to avoid dependence/tolerance 180 tablet 0  . ciprofloxacin (CIPRO) 500 MG tablet Take 1 tablet (500 mg total) by mouth 2 (two) times daily for 7 days. 14 tablet 0  . diphenhydrAMINE (BENADRYL) 50 MG tablet Take 1 tablet (50 mg total) by mouth once for 1 dose. 1 hour prior to contrast administration (Patient not taking: Reported on 01/16/2018) 1 tablet 0  . metroNIDAZOLE (FLAGYL) 500 MG tablet Take 1 tablet (500 mg  total) by mouth 3 (three) times daily for 7 days. 21 tablet 0  . ondansetron (ZOFRAN-ODT) 8 MG disintegrating tablet Take 1 tablet (8 mg total) by mouth every 8 (eight) hours as needed for nausea. 20 tablet 3   No current facility-administered medications for this visit.     Allergies  Allergen Reactions  . Albuterol Palpitations and Other (See Comments)    HALLUCINATIONS Also feels "funny in the head"  . Dilaudid [Hydromorphone] Anaphylaxis    Caused patient to have heart attack and crazy in the head  . Codeine Hives and Itching  . Ivp Dye [Iodinated Diagnostic Agents] Other (See Comments)    Itching and causes me to pass out Must be  pre-treated with benadryl.  . Lanolin Hives    "burns skin off"  . Latex Dermatitis    LEAVES REALLY BAD BURN ON SKIN   . Levaquin [Levofloxacin In D5w] Other (See Comments)    "I get real dizzy and pass out"    . Metrizamide Hives, Itching, Rash and Other (See Comments)    Itching and causes me to pass out  . Nickel Rash  . Penicillins Hives  . Percocet [Oxycodone-Acetaminophen] Itching  . Tape Itching    With latex additives.      Review of Systems:  Constitutional:  No  fever, no chills, +recent illness, No unintentional weight changes. No significant fatigue.   HEENT: No  headache, no vision change, no hearing change, No sore throat, No  sinus pressure  Cardiac: No  chest pain, No  pressure, No palpitations  Respiratory:  No  shortness of breath. No  Cough  Gastrointestinal: +abdominal pain, No  nausea, No  vomiting,  No  blood in stool, +diarrhea  Skin: No  Rash  Neurologic: +generalized weakness, No  dizziness  Exam:  BP 122/75 (BP Location: Left Arm, Patient Position: Sitting, Cuff Size: Large)   Pulse 79   Temp 98.1 F (36.7 C) (Oral)   Wt 209 lb 6.4 oz (95 kg)   BMI 34.85 kg/m   Constitutional: VS see above. General Appearance: alert, well-developed, well-nourished, NAD  Eyes: Normal lids and conjunctive, non-icteric sclera  Ears, Nose, Mouth, Throat: MMM, Normal external inspection ears/nares/mouth/lips/gums. TM normal bilaterally. Pharynx/tonsils no erythema, no exudate. Nasal mucosa normal.   Neck: No masses, trachea midline. No tenderness/mass appreciated. No lymphadenopathy  Respiratory: Normal respiratory effort. no wheeze, no rhonchi, no rales  Cardiovascular: S1/S2 normal, no murmur, no rub/gallop auscultated. RRR. No lower extremity edema.   Gastrointestinal: Mild TTP LLQ, no rebound or guarding, nondistended, no masses. Bowel sounds normal x4 quadrants.   Musculoskeletal: Gait normal.   Neurological: Normal balance/coordination. No tremor.    Skin: warm, dry, intact.   Psychiatric: Normal judgment/insight. Normal mood and affect.        ASSESSMENT/PLAN: The primary encounter diagnosis was Diarrhea of presumed infectious origin. A diagnosis of Left lower quadrant abdominal pain was also pertinent to this visit.   Meds ordered this encounter  Medications  . ciprofloxacin (CIPRO) 500 MG tablet    Sig: Take 1 tablet (500 mg total) by mouth 2 (two) times daily for 7 days.    Dispense:  14 tablet    Refill:  0  . metroNIDAZOLE (FLAGYL) 500 MG tablet    Sig: Take 1 tablet (500 mg total) by mouth 3 (three) times daily for 7 days.    Dispense:  21 tablet    Refill:  0  . ondansetron (ZOFRAN-ODT) 8 MG disintegrating tablet  Sig: Take 1 tablet (8 mg total) by mouth every 8 (eight) hours as needed for nausea.    Dispense:  20 tablet    Refill:  3    Orders Placed This Encounter  Procedures  . C. difficile GDH and Toxin A/B    Patient Instructions  I think your symptoms are most likely due to colitis, we will also get testing to evaluate for possible C. difficile.  Please take antibiotics as directed, I also sent nausea medication just in case she needed.  Please see below for information on diet.  While you are ill, we should try to stick to a clear liquid diet and slowly progressed to a full liquid diet and then a bland diet as you start to feel better.  Will get CT scan if no better or if worse.          Visit summary with medication list and pertinent instructions was printed for patient to review. All questions at time of visit were answered - patient instructed to contact office with any additional concerns or updates. ER/RTC precautions were reviewed with the patient.   Follow-up plan: Return if symptoms worsen or fail to improve.   Please note: voice recognition software was used to produce this document, and typos may escape review. Please contact Dr. Sheppard Coil for any needed clarifications.

## 2018-02-21 NOTE — Addendum Note (Signed)
Addended by: Maryla Morrow on: 02/21/2018 11:31 AM   Modules accepted: Orders

## 2018-02-23 ENCOUNTER — Other Ambulatory Visit: Payer: Self-pay

## 2018-02-23 ENCOUNTER — Encounter: Payer: Self-pay | Admitting: Emergency Medicine

## 2018-02-23 ENCOUNTER — Emergency Department (INDEPENDENT_AMBULATORY_CARE_PROVIDER_SITE_OTHER)
Admission: EM | Admit: 2018-02-23 | Discharge: 2018-02-23 | Payer: Medicare Other | Source: Home / Self Care | Attending: Emergency Medicine | Admitting: Emergency Medicine

## 2018-02-23 DIAGNOSIS — Z79899 Other long term (current) drug therapy: Secondary | ICD-10-CM | POA: Diagnosis not present

## 2018-02-23 DIAGNOSIS — J9601 Acute respiratory failure with hypoxia: Secondary | ICD-10-CM | POA: Diagnosis not present

## 2018-02-23 DIAGNOSIS — I959 Hypotension, unspecified: Secondary | ICD-10-CM | POA: Diagnosis not present

## 2018-02-23 DIAGNOSIS — S71102A Unspecified open wound, left thigh, initial encounter: Secondary | ICD-10-CM | POA: Diagnosis not present

## 2018-02-23 DIAGNOSIS — I252 Old myocardial infarction: Secondary | ICD-10-CM | POA: Diagnosis not present

## 2018-02-23 DIAGNOSIS — K5010 Crohn's disease of large intestine without complications: Secondary | ICD-10-CM | POA: Diagnosis not present

## 2018-02-23 DIAGNOSIS — J90 Pleural effusion, not elsewhere classified: Secondary | ICD-10-CM | POA: Diagnosis not present

## 2018-02-23 DIAGNOSIS — R55 Syncope and collapse: Secondary | ICD-10-CM

## 2018-02-23 DIAGNOSIS — Z8619 Personal history of other infectious and parasitic diseases: Secondary | ICD-10-CM | POA: Diagnosis not present

## 2018-02-23 DIAGNOSIS — N200 Calculus of kidney: Secondary | ICD-10-CM | POA: Diagnosis not present

## 2018-02-23 DIAGNOSIS — R112 Nausea with vomiting, unspecified: Secondary | ICD-10-CM

## 2018-02-23 DIAGNOSIS — Z7982 Long term (current) use of aspirin: Secondary | ICD-10-CM | POA: Diagnosis not present

## 2018-02-23 DIAGNOSIS — Z881 Allergy status to other antibiotic agents status: Secondary | ICD-10-CM | POA: Diagnosis not present

## 2018-02-23 DIAGNOSIS — G43909 Migraine, unspecified, not intractable, without status migrainosus: Secondary | ICD-10-CM | POA: Diagnosis not present

## 2018-02-23 DIAGNOSIS — I10 Essential (primary) hypertension: Secondary | ICD-10-CM | POA: Diagnosis not present

## 2018-02-23 DIAGNOSIS — K5730 Diverticulosis of large intestine without perforation or abscess without bleeding: Secondary | ICD-10-CM | POA: Diagnosis not present

## 2018-02-23 DIAGNOSIS — J188 Other pneumonia, unspecified organism: Secondary | ICD-10-CM | POA: Diagnosis not present

## 2018-02-23 DIAGNOSIS — G4709 Other insomnia: Secondary | ICD-10-CM | POA: Diagnosis not present

## 2018-02-23 DIAGNOSIS — R197 Diarrhea, unspecified: Secondary | ICD-10-CM

## 2018-02-23 DIAGNOSIS — R1032 Left lower quadrant pain: Secondary | ICD-10-CM | POA: Diagnosis not present

## 2018-02-23 DIAGNOSIS — I2510 Atherosclerotic heart disease of native coronary artery without angina pectoris: Secondary | ICD-10-CM | POA: Diagnosis not present

## 2018-02-23 DIAGNOSIS — R0902 Hypoxemia: Secondary | ICD-10-CM | POA: Diagnosis not present

## 2018-02-23 DIAGNOSIS — M797 Fibromyalgia: Secondary | ICD-10-CM | POA: Diagnosis not present

## 2018-02-23 DIAGNOSIS — R918 Other nonspecific abnormal finding of lung field: Secondary | ICD-10-CM | POA: Diagnosis not present

## 2018-02-23 DIAGNOSIS — J168 Pneumonia due to other specified infectious organisms: Secondary | ICD-10-CM | POA: Diagnosis not present

## 2018-02-23 DIAGNOSIS — K5790 Diverticulosis of intestine, part unspecified, without perforation or abscess without bleeding: Secondary | ICD-10-CM | POA: Diagnosis not present

## 2018-02-23 DIAGNOSIS — E559 Vitamin D deficiency, unspecified: Secondary | ICD-10-CM | POA: Diagnosis not present

## 2018-02-23 DIAGNOSIS — E785 Hyperlipidemia, unspecified: Secondary | ICD-10-CM | POA: Diagnosis not present

## 2018-02-23 DIAGNOSIS — S71112A Laceration without foreign body, left thigh, initial encounter: Secondary | ICD-10-CM | POA: Diagnosis not present

## 2018-02-23 DIAGNOSIS — R069 Unspecified abnormalities of breathing: Secondary | ICD-10-CM | POA: Diagnosis not present

## 2018-02-23 DIAGNOSIS — Z951 Presence of aortocoronary bypass graft: Secondary | ICD-10-CM | POA: Diagnosis not present

## 2018-02-23 DIAGNOSIS — S71002A Unspecified open wound, left hip, initial encounter: Secondary | ICD-10-CM

## 2018-02-23 DIAGNOSIS — R1110 Vomiting, unspecified: Secondary | ICD-10-CM | POA: Diagnosis not present

## 2018-02-23 DIAGNOSIS — J9621 Acute and chronic respiratory failure with hypoxia: Secondary | ICD-10-CM | POA: Diagnosis not present

## 2018-02-23 DIAGNOSIS — K5792 Diverticulitis of intestine, part unspecified, without perforation or abscess without bleeding: Secondary | ICD-10-CM | POA: Diagnosis not present

## 2018-02-23 DIAGNOSIS — R509 Fever, unspecified: Secondary | ICD-10-CM | POA: Diagnosis not present

## 2018-02-23 NOTE — Discharge Instructions (Signed)
You are being transported to the hospital for evaluation

## 2018-02-23 NOTE — ED Provider Notes (Signed)
[facility]    CSN: [id number] Arrival date & time: 02/23/18  0938     History   Chief Complaint Chief Complaint  Patient presents with  . Nausea    HPI Carrie Salazar is a 73 y.o. female.   HPI Patient was in her usual state of health until this morning she was up and had a weak spell and fell striking her left leg on a porcelain bowl.  She sustained a laceration.  She feels extremely weak and fatigued.  She has been battling vomiting and diarrhea is currently on Cipro and Flagyl.  She is continued to have a fever.  She has a history of coronary disease and a history of atrial flutter.  She is not complaining of shortness of breath or chest pain Past Medical History:  Diagnosis Date  . Chronic fatigue   . Depression   . Fatigue 08/16/2016  . Fibromyalgia   . Heart attack ([place])   . Hx of CABG   . Hyperlipidemia   . Hypertension   . Migraine     Patient Active Problem List   Diagnosis Date Noted  . Hot flashes 10/20/2017  . Presbyopia 08/08/2017  . Puckering of macula, right eye 08/08/2017  . Retinal hemorrhage, right eye 08/08/2017  . Rectal prolapse 07/25/2017  . Palpitations 02/01/2017  . Folate deficiency 02/01/2017  . History of anemia 02/01/2017  . B12 deficiency 02/01/2017  . Migraine   . Hypertension   . Hyperlipidemia   . Hx of CABG   . Heart attack ([place])   . Fibromyalgia   . Depression with anxiety   . Chronic fatigue   . Trochanteric bursitis, left hip 11/30/2016  . Degenerative arthritis of thumb, right 11/30/2016  . Fatigue 08/16/2016  . Vitamin D deficiency 08/09/2016  . Neuropathy 05/24/2016  . Elevated serum creatinine 03/31/2016  . Blood pressure instability 03/31/2016  . Other headache syndrome 02/25/2016  . SOB (shortness of breath) on exertion 01/25/2016  . Anemia 01/25/2016  . Vaginal lesion 08/27/202017  . Moderate episode of recurrent major depressive disorder ([place]) 12/10/2015  . Female cystocele 10/16/2015  . Urinary  urgency 10/16/2015  . History of obstructive sleep apnea 08/20/2015  . Atrial flutter ([place]) 08/20/2015  . Coronary artery disease involving coronary bypass graft of native heart with angina pectoris ([place]) 08/13/2015  . Chronic back pain 08/13/2015  . Recurrent UTI 08/13/2015  . Insomnia 08/13/2015  . Hyperhidrosis 08/13/2015    Past Surgical History:  Procedure Laterality Date  . ABDOMINAL HYSTERECTOMY    . APPENDECTOMY    . BACK SURGERY    . BACK SURGERY     Lumbar rods  . BLADDER SURGERY    . BREAST BIOPSY Left    x 2  . BREAST EXCISIONAL BIOPSY Left    x 2  . BREAST LUMPECTOMY     left breast all benign (x2)  . CARPAL TUNNEL RELEASE Bilateral   . CORONARY ARTERY BYPASS GRAFT     Triple  . LYMPECTOMY  1980  . TONSILLECTOMY    . VAGINA SURGERY    . WRIST SURGERY      OB History   No obstetric history on file.      Home Medications    Prior to Admission medications   Medication Sig Start Date End Date Taking? Authorizing Provider  acetaZOLAMIDE (DIAMOX) 125 MG tablet TAKE 1 TABLET(125 MG) BY MOUTH THREE TIMES DAILY 12/21/17   [name], DO  aspirin EC 81 MG  tablet Take 1 tablet (81 mg total) by mouth daily. 10/30/17   Lelon Perla, MD  cetirizine (ZYRTEC) 5 MG tablet Take 1 tablet (5 mg total) by mouth daily. Patient taking differently: Take 5 mg by mouth as needed.  12/25/16   Noe Gens, PA-C  cholecalciferol (VITAMIN D) 1000 units tablet Take 1,000 Units by mouth daily.    [provider]  ciprofloxacin (CIPRO) 500 MG tablet Take 1 tablet (500 mg total) by mouth 2 (two) times daily for 7 days. 02/21/18 02/28/18  [name], DO  escitalopram (LEXAPRO) 20 MG tablet Take 1 tablet (20 mg total) by mouth daily. 11/23/17   [name], DO  estradiol (ESTRACE) 0.1 MG/GM vaginal cream INSERT 1/2 TO 1 APPLICATORFUL VAGINALLY 3 TIMES A WEEK 10/13/16   [name], DO  estradiol (ESTRACE) 1 MG tablet Take 1 tablet (1 mg total)  by mouth daily. 10/19/17   [name], DO  Evolocumab with Infusor ([place]) 420 MG/3.5ML SOCT Inject 420 mg into the skin every 30 (thirty) days. 12/22/17   Lelon Perla, MD  fenofibrate (TRICOR) 48 MG tablet TAKE 1 TABLET(48 MG) BY MOUTH DAILY 11/14/17   Lelon Perla, MD  gabapentin (NEURONTIN) 600 MG tablet TAKE 1 TABLET BY MOUTH IN THE MORNING, 1 TABLET IN THE AFTERNOON, AND 2 TABLETS IN THE EVENING( 1200 MG TOTAL PER DAY) 02/14/18   [name], DO  ipratropium (ATROVENT HFA) 17 MCG/ACT inhaler Inhale 2 puffs into the lungs every 4 (four) hours as needed for wheezing. 12/22/16   [name], DO  lisinopril (PRINIVIL,ZESTRIL) 40 MG tablet Take 1 tablet (40 mg total) by mouth daily. 10/30/17   Lelon Perla, MD  metoprolol succinate (TOPROL XL) 25 MG 24 hr tablet Take 0.5 tablets (12.5 mg total) by mouth daily. 12/29/17   Lelon Perla, MD  metroNIDAZOLE (FLAGYL) 500 MG tablet Take 1 tablet (500 mg total) by mouth 3 (three) times daily for 7 days. 02/21/18 02/28/18  [name], DO  Multiple Vitamins-Minerals (HAIR SKIN AND NAILS FORMULA PO) Take 1 tablet by mouth 2 (two) times daily.    [provider]  nitroGLYCERIN (NITROSTAT) 0.4 MG SL tablet Place 1 tablet (0.4 mg total) under the tongue every 5 (five) minutes as needed for chest pain. 05/31/17   Lelon Perla, MD  ondansetron (ZOFRAN-ODT) 8 MG disintegrating tablet Take 1 tablet (8 mg total) by mouth every 8 (eight) hours as needed for nausea. 02/21/18   [name], DO  ranolazine (RANEXA) 500 MG 12 hr tablet TAKE 1 TABLET BY MOUTH TWO  TIMES DAILY 05/01/17   Lelon Perla, MD    Family History Family History  Problem Relation Age of Onset  . Cancer Mother   . Hyperlipidemia Father   . Hypertension Father   . Stroke Father   . Heart attack Father   . Hyperlipidemia Maternal Grandmother   . Hypertension Maternal Grandmother   . Stroke Maternal  Grandmother   . Hemophilia Maternal Grandfather     Social History Social History   Tobacco Use  . Smoking status: Former Smoker    Last attempt to quit: 2003    Years since quitting: 17.1  . Smokeless tobacco: Never Used  Substance Use Topics  . Alcohol use: Yes    Comment: 1 q wk  . Drug use: No     Allergies   Albuterol; Dilaudid [hydromorphone]; Codeine; Ivp dye [iodinated diagnostic agents]; Lanolin; Latex; Levaquin [levofloxacin in  d5w]; Metrizamide; Nickel; Penicillins; Percocet [oxycodone-acetaminophen]; and Tape   Review of Systems Review of Systems  Constitutional: Positive for fever.  Respiratory: Negative.   Cardiovascular: Negative.   Gastrointestinal: Positive for diarrhea, nausea and vomiting.     Physical Exam Triage Vital Signs ED Triage Vitals  Enc Vitals Group     BP      Pulse      Resp      Temp      Temp src      SpO2      Weight      Height      Head Circumference      Peak Flow      Pain Score      Pain Loc      Pain Edu?      Excl. in [place]?    No data found.  Updated Vital Signs BP (!) 147/70 (BP Location: Right Arm)   Pulse 84   Temp 100.3 F (37.9 C) (Oral)   Ht 5\' 5"  (1.651 m)   Wt 92.5 kg   SpO2 90% Comment: 2 lit  BMI 33.95 kg/m   Visual Acuity Right Eye Distance:   Left Eye Distance:   Bilateral Distance:    Right Eye Near:   Left Eye Near:    Bilateral Near:     Physical Exam Constitutional:      Comments: Patient is responsive but appears weak.  HENT:     Head: Normocephalic.  Neck:     Musculoskeletal: Normal range of motion.  Cardiovascular:     Rate and Rhythm: Normal rate and regular rhythm.  Pulmonary:     Effort: Pulmonary effort is normal.     Breath sounds: Normal breath sounds.  Skin:    Comments: There is a 2 x 3 cm flap-like laceration on the back of her upper left thigh      UC Treatments / Results  Labs (all labs ordered are listed, but only abnormal results are displayed) Labs  Reviewed - No data to display  EKG Normal sinus rhythm.  T wave changes 1 and aVL Radiology No results found.  Procedures Procedures (including critical care time)  Medications Ordered in UC Medications - No data to display  Initial Impression / Assessment and Plan / UC Course  I have reviewed the triage vital signs and the nursing notes.  Pertinent labs & imaging results that were available during my care of the patient were reviewed by me and considered in my medical decision making (see chart for details). Patient has been battling vomiting and diarrhea.  Pulse ox of 83 on arrival which improved to 92 on 2 L.  She appears weak and fatigued and has a laceration of her upper thigh.  She continues to battle with vomiting and diarrhea and has persistent fever here.  She was stabilized on O2.  EKG was performed.  EMS called and she will be transported to the hospital for evaluation.     Final Clinical Impressions(s) / UC Diagnoses   Final diagnoses:  Syncope and collapse  Hypoxia  Open wound of left hip and thigh with complication, initial encounter  Nausea vomiting and diarrhea     Discharge Instructions     You are being transported to the hospital for evaluation    ED Prescriptions    None     Controlled Substance Prescriptions [place] Controlled Substance Registry consulted? Not Applicable   Darlyne Russian, MD 02/23/18 1200

## 2018-02-23 NOTE — ED Triage Notes (Signed)
Fall, laceration on left hip Nausea, weak, vomiting, diarrhea, x 1week

## 2018-02-24 DIAGNOSIS — M549 Dorsalgia, unspecified: Secondary | ICD-10-CM | POA: Diagnosis not present

## 2018-02-24 DIAGNOSIS — I10 Essential (primary) hypertension: Secondary | ICD-10-CM | POA: Diagnosis not present

## 2018-02-24 DIAGNOSIS — K5792 Diverticulitis of intestine, part unspecified, without perforation or abscess without bleeding: Secondary | ICD-10-CM | POA: Diagnosis not present

## 2018-02-24 DIAGNOSIS — J9621 Acute and chronic respiratory failure with hypoxia: Secondary | ICD-10-CM | POA: Diagnosis not present

## 2018-02-24 LAB — C. DIFFICILE GDH AND TOXIN A/B
GDH ANTIGEN: NOT DETECTED
MICRO NUMBER:: 199300
SPECIMEN QUALITY:: ADEQUATE
TOXIN A AND B: NOT DETECTED

## 2018-02-25 DIAGNOSIS — K5792 Diverticulitis of intestine, part unspecified, without perforation or abscess without bleeding: Secondary | ICD-10-CM | POA: Diagnosis not present

## 2018-02-25 DIAGNOSIS — J9621 Acute and chronic respiratory failure with hypoxia: Secondary | ICD-10-CM | POA: Diagnosis not present

## 2018-02-25 DIAGNOSIS — I10 Essential (primary) hypertension: Secondary | ICD-10-CM | POA: Diagnosis not present

## 2018-02-25 DIAGNOSIS — M549 Dorsalgia, unspecified: Secondary | ICD-10-CM | POA: Diagnosis not present

## 2018-02-26 DIAGNOSIS — I10 Essential (primary) hypertension: Secondary | ICD-10-CM | POA: Diagnosis not present

## 2018-02-26 DIAGNOSIS — K5792 Diverticulitis of intestine, part unspecified, without perforation or abscess without bleeding: Secondary | ICD-10-CM | POA: Diagnosis not present

## 2018-02-26 DIAGNOSIS — Z951 Presence of aortocoronary bypass graft: Secondary | ICD-10-CM | POA: Diagnosis not present

## 2018-02-26 DIAGNOSIS — J9621 Acute and chronic respiratory failure with hypoxia: Secondary | ICD-10-CM | POA: Diagnosis not present

## 2018-02-26 MED ORDER — ENOXAPARIN SODIUM 40 MG/0.4ML ~~LOC~~ SOLN
40.00 | SUBCUTANEOUS | Status: DC
Start: 2018-02-26 — End: 2018-02-26

## 2018-02-26 MED ORDER — MELATONIN 3 MG PO TABS
3.00 | ORAL_TABLET | ORAL | Status: DC
Start: 2018-02-26 — End: 2018-02-26

## 2018-02-26 MED ORDER — GENERIC EXTERNAL MEDICATION
10.00 | Status: DC
Start: ? — End: 2018-02-26

## 2018-02-26 MED ORDER — METRONIDAZOLE 250 MG PO TABS
500.00 | ORAL_TABLET | ORAL | Status: DC
Start: 2018-02-26 — End: 2018-02-26

## 2018-02-26 MED ORDER — RANOLAZINE ER 500 MG PO TB12
500.00 | ORAL_TABLET | ORAL | Status: DC
Start: 2018-02-26 — End: 2018-02-26

## 2018-02-26 MED ORDER — ACETAZOLAMIDE 250 MG PO TABS
250.00 | ORAL_TABLET | ORAL | Status: DC
Start: ? — End: 2018-02-26

## 2018-02-26 MED ORDER — CIPROFLOXACIN HCL 500 MG PO TABS
500.00 | ORAL_TABLET | ORAL | Status: DC
Start: 2018-02-26 — End: 2018-02-26

## 2018-02-26 MED ORDER — GABAPENTIN 300 MG PO CAPS
600.00 | ORAL_CAPSULE | ORAL | Status: DC
Start: 2018-02-26 — End: 2018-02-26

## 2018-02-26 MED ORDER — GENERIC EXTERNAL MEDICATION
0.50 | Status: DC
Start: ? — End: 2018-02-26

## 2018-02-26 MED ORDER — TRAMADOL HCL 50 MG PO TABS
50.00 | ORAL_TABLET | ORAL | Status: DC
Start: ? — End: 2018-02-26

## 2018-02-26 MED ORDER — ACIDOPHILUS/PECTIN PO CAPS
1.00 | ORAL_CAPSULE | ORAL | Status: DC
Start: 2018-02-26 — End: 2018-02-26

## 2018-02-26 MED ORDER — LISINOPRIL 20 MG PO TABS
40.00 | ORAL_TABLET | ORAL | Status: DC
Start: 2018-02-27 — End: 2018-02-26

## 2018-02-26 MED ORDER — SODIUM CHLORIDE 0.9 % IV SOLN
10.00 | INTRAVENOUS | Status: DC
Start: ? — End: 2018-02-26

## 2018-02-26 MED ORDER — ASPIRIN EC 81 MG PO TBEC
81.00 | DELAYED_RELEASE_TABLET | ORAL | Status: DC
Start: 2018-02-27 — End: 2018-02-26

## 2018-02-26 MED ORDER — ALUM & MAG HYDROXIDE-SIMETH 200-200-20 MG/5ML PO SUSP
30.00 | ORAL | Status: DC
Start: ? — End: 2018-02-26

## 2018-02-26 MED ORDER — LORATADINE 10 MG PO TABS
10.00 | ORAL_TABLET | ORAL | Status: DC
Start: 2018-02-27 — End: 2018-02-26

## 2018-02-26 MED ORDER — IPRATROPIUM BROMIDE 0.02 % IN SOLN
0.50 | RESPIRATORY_TRACT | Status: DC
Start: ? — End: 2018-02-26

## 2018-02-26 MED ORDER — GENERIC EXTERNAL MEDICATION
4.00 | Status: DC
Start: ? — End: 2018-02-26

## 2018-02-26 MED ORDER — GENERIC EXTERNAL MEDICATION
650.00 | Status: DC
Start: ? — End: 2018-02-26

## 2018-02-26 MED ORDER — ESCITALOPRAM OXALATE 10 MG PO TABS
20.00 | ORAL_TABLET | ORAL | Status: DC
Start: 2018-02-27 — End: 2018-02-26

## 2018-02-26 MED ORDER — SODIUM CHLORIDE 0.9 % IV SOLN
75.00 | INTRAVENOUS | Status: DC
Start: ? — End: 2018-02-26

## 2018-02-26 MED ORDER — NITROGLYCERIN 0.4 MG SL SUBL
0.40 | SUBLINGUAL_TABLET | SUBLINGUAL | Status: DC
Start: ? — End: 2018-02-26

## 2018-02-27 ENCOUNTER — Telehealth: Payer: Self-pay

## 2018-02-27 NOTE — Telephone Encounter (Signed)
Left message with contact information for any questions or concerns.

## 2018-03-01 ENCOUNTER — Other Ambulatory Visit: Payer: Self-pay | Admitting: Osteopathic Medicine

## 2018-03-01 DIAGNOSIS — J209 Acute bronchitis, unspecified: Secondary | ICD-10-CM

## 2018-03-02 ENCOUNTER — Ambulatory Visit: Payer: Medicare Other | Admitting: Physical Medicine & Rehabilitation

## 2018-03-05 ENCOUNTER — Telehealth: Payer: Self-pay | Admitting: Osteopathic Medicine

## 2018-03-05 NOTE — Telephone Encounter (Signed)
Patient was seen in [facility] on 02/23/18 till 02/26/18. Was seen for diverticulitis and cause she fell at home. Wanted to be seen for a hospital follow up, stitch removal in her left hip and wanted to get checked for pneumonia. Patient wanted to know if this can be done all in one visit. Please advise.

## 2018-03-05 NOTE — Telephone Encounter (Signed)
Appointment has been made for 03/09/2018. No further questions at this time.

## 2018-03-05 NOTE — Telephone Encounter (Signed)
Can schedule in 40 min slot (can use 2 follow-up slots or 40-minute)

## 2018-03-08 ENCOUNTER — Telehealth: Payer: Self-pay | Admitting: Cardiology

## 2018-03-08 NOTE — Telephone Encounter (Signed)
Verified with her pharmacy. Repatha [facility] co-pay is $47/month.  Repatha patient assistance was denied January 06/2018.   Patient stated she is able to afford $47 per month. Will go and pick up ASAP.

## 2018-03-08 NOTE — Telephone Encounter (Signed)
New Message         Patient is calling in today to change her cholesterol medication due to the cost, patient states she can't afford.Patient is currently taking "Repatha"she states she has taken 2 dosages and she can't afford it, need a replacement. Pls call and advise.

## 2018-03-08 NOTE — Telephone Encounter (Signed)
You saw her

## 2018-03-09 ENCOUNTER — Ambulatory Visit (INDEPENDENT_AMBULATORY_CARE_PROVIDER_SITE_OTHER): Payer: Medicare Other | Admitting: Osteopathic Medicine

## 2018-03-09 ENCOUNTER — Ambulatory Visit (INDEPENDENT_AMBULATORY_CARE_PROVIDER_SITE_OTHER): Payer: Medicare Other

## 2018-03-09 VITALS — BP 163/81 | HR 72 | Temp 99.4°F | Wt 215.5 lb

## 2018-03-09 DIAGNOSIS — R5381 Other malaise: Secondary | ICD-10-CM

## 2018-03-09 DIAGNOSIS — R0602 Shortness of breath: Secondary | ICD-10-CM | POA: Diagnosis not present

## 2018-03-09 DIAGNOSIS — J209 Acute bronchitis, unspecified: Secondary | ICD-10-CM | POA: Diagnosis not present

## 2018-03-09 DIAGNOSIS — R296 Repeated falls: Secondary | ICD-10-CM | POA: Insufficient documentation

## 2018-03-09 DIAGNOSIS — J189 Pneumonia, unspecified organism: Secondary | ICD-10-CM

## 2018-03-09 DIAGNOSIS — K5792 Diverticulitis of intestine, part unspecified, without perforation or abscess without bleeding: Secondary | ICD-10-CM | POA: Diagnosis not present

## 2018-03-09 DIAGNOSIS — Z4802 Encounter for removal of sutures: Secondary | ICD-10-CM

## 2018-03-09 DIAGNOSIS — R05 Cough: Secondary | ICD-10-CM | POA: Diagnosis not present

## 2018-03-09 MED ORDER — IPRATROPIUM BROMIDE HFA 17 MCG/ACT IN AERS: 2.0000 | INHALATION_SPRAY | RESPIRATORY_TRACT | 3 refills | Status: AC | PRN

## 2018-03-09 NOTE — Progress Notes (Signed)
HPI: Carrie Salazar is a 73 y.o. female who  has a past medical history of Chronic fatigue, Depression, Fatigue (08/16/2016), Fibromyalgia, Heart attack ([place]), CABG, Hyperlipidemia, Hypertension, and Migraine.  she presents to [facility] today, 03/09/18,  for chief complaint of: Hospital follow-up  Suture removal Pt would like checked for PNA  Today patient reports significant fatigue, persistent after hospitalization.  Reports mild cough, no fever, no shortness of breath out of the ordinary for her.  She has baseline dyspnea on exertion, known CAD.  She states she has been fairly sedentary since hospital discharge.  Pain in left hip is bothering her somewhat but is better than it was.  She fell onto a music box and had a significant laceration on her left upper lateral thigh, needs sutures removed.   Records reviewed:  Hospitalized 2 weeks ago (02/23/2018-02/26/2018) for diverticulitis, migraine, pneumonia of left lower lobe.    Presented to the ER with complaints of nausea, vomiting, diarrhea, fever for about 5 days, fever for 2 days.    Fell at home and landed on left hip.  Laceration was repaired and patient presents today for suture removal.    Pt was discharged on Protonix 40 mg daily, Phenergan 12.5 mg every 6 hours as needed.   Rash with Rocephin.  Seen here 2 days prior to that (03/02/2018) with complaints of lower abdominal pain and diarrhea.  We tested for C. difficile, I also prescribed Cipro and Flagyl.      Past medical, surgical, social and family history reviewed:  Patient Active Problem List   Diagnosis Date Noted  . Hot flashes 10/20/2017  . Presbyopia 08/08/2017  . Puckering of macula, right eye 08/08/2017  . Retinal hemorrhage, right eye 08/08/2017  . Rectal prolapse 07/25/2017  . Palpitations 02/01/2017  . Folate deficiency 02/01/2017  . History of anemia 02/01/2017  . B12 deficiency 02/01/2017  . Migraine   .  Hypertension   . Hyperlipidemia   . Hx of CABG   . Heart attack ([place])   . Fibromyalgia   . Depression with anxiety   . Chronic fatigue   . Trochanteric bursitis, left hip 11/30/2016  . Degenerative arthritis of thumb, right 11/30/2016  . Fatigue 08/16/2016  . Vitamin D deficiency 08/09/2016  . Neuropathy 05/24/2016  . Elevated serum creatinine 03/31/2016  . Blood pressure instability 03/31/2016  . Other headache syndrome 02/25/2016  . SOB (shortness of breath) on exertion 01/25/2016  . Anemia 01/25/2016  . Vaginal lesion April 05, 202017  . Moderate episode of recurrent major depressive disorder ([place]) 12/10/2015  . Female cystocele 10/16/2015  . Urinary urgency 10/16/2015  . History of obstructive sleep apnea 08/20/2015  . Atrial flutter ([place]) 08/20/2015  . Coronary artery disease involving coronary bypass graft of native heart with angina pectoris ([place]) 08/13/2015  . Chronic back pain 08/13/2015  . Recurrent UTI 08/13/2015  . Insomnia 08/13/2015  . Hyperhidrosis 08/13/2015    Past Surgical History:  Procedure Laterality Date  . ABDOMINAL HYSTERECTOMY    . APPENDECTOMY    . BACK SURGERY    . BACK SURGERY     Lumbar rods  . BLADDER SURGERY    . BREAST BIOPSY Left    x 2  . BREAST EXCISIONAL BIOPSY Left    x 2  . BREAST LUMPECTOMY     left breast all benign (x2)  . CARPAL TUNNEL RELEASE Bilateral   . CORONARY ARTERY BYPASS GRAFT     Triple  . LYMPECTOMY  [place]    . VAGINA SURGERY    . WRIST SURGERY      Social History   Tobacco Use  . Smoking status: Former Smoker    Last attempt to quit: 2003    Years since quitting: 17.1  . Smokeless tobacco: Never Used  Substance Use Topics  . Alcohol use: Yes    Comment: 1 q wk    Family History  Problem Relation Age of Onset  . Cancer Mother   . Hyperlipidemia Father   . Hypertension Father   . Stroke Father   . Heart attack Father   . Hyperlipidemia Maternal Grandmother   . Hypertension Maternal  Grandmother   . Stroke Maternal Grandmother   . Hemophilia Maternal Grandfather      Current medication list and allergy/intolerance information reviewed:    Current Outpatient Medications  Medication Sig Dispense Refill  . acetaZOLAMIDE (DIAMOX) 125 MG tablet TAKE 1 TABLET(125 MG) BY MOUTH THREE TIMES DAILY 90 tablet 0  . aspirin EC 81 MG tablet Take 1 tablet (81 mg total) by mouth daily.    . ATROVENT HFA 17 MCG/ACT inhaler INHALE 2 PUFFS INTO THE LUNGS EVERY 4 HOURS AS NEEDED FOR WHEEZING 12.9 g 3  . cetirizine (ZYRTEC) 5 MG tablet Take 1 tablet (5 mg total) by mouth daily. (Patient taking differently: Take 5 mg by mouth as needed. ) 30 tablet 0  . cholecalciferol (VITAMIN D) 1000 units tablet Take 1,000 Units by mouth daily.    Marland Kitchen escitalopram (LEXAPRO) 20 MG tablet Take 1 tablet (20 mg total) by mouth daily. 90 tablet 3  . estradiol (ESTRACE) 0.1 MG/GM vaginal cream INSERT 1/2 TO 1 APPLICATORFUL VAGINALLY 3 TIMES A WEEK 42.5 g 0  . estradiol (ESTRACE) 1 MG tablet Take 1 tablet (1 mg total) by mouth daily. 90 tablet 3  . Evolocumab with Infusor ([place]) 420 MG/3.5ML SOCT Inject 420 mg into the skin every 30 (thirty) days. 1 Cartridge 11  . fenofibrate (TRICOR) 48 MG tablet TAKE 1 TABLET(48 MG) BY MOUTH DAILY 90 tablet 1  . gabapentin (NEURONTIN) 600 MG tablet TAKE 1 TABLET BY MOUTH IN THE MORNING, 1 TABLET IN THE AFTERNOON, AND 2 TABLETS IN THE EVENING( 1200 MG TOTAL PER DAY) 120 tablet 3  . lisinopril (PRINIVIL,ZESTRIL) 40 MG tablet Take 1 tablet (40 mg total) by mouth daily. 90 tablet 3  . metoprolol succinate (TOPROL XL) 25 MG 24 hr tablet Take 0.5 tablets (12.5 mg total) by mouth daily. 45 tablet 3  . Multiple Vitamins-Minerals (HAIR SKIN AND NAILS FORMULA PO) Take 1 tablet by mouth 2 (two) times daily.    . nitroGLYCERIN (NITROSTAT) 0.4 MG SL tablet Place 1 tablet (0.4 mg total) under the tongue every 5 (five) minutes as needed for chest pain. 25 tablet 12  .  ondansetron (ZOFRAN-ODT) 8 MG disintegrating tablet Take 1 tablet (8 mg total) by mouth every 8 (eight) hours as needed for nausea. 20 tablet 3  . ranolazine (RANEXA) 500 MG 12 hr tablet TAKE 1 TABLET BY MOUTH TWO  TIMES DAILY 180 tablet 3   No current facility-administered medications for this visit.     Allergies  Allergen Reactions  . Albuterol Palpitations and Other (See Comments)    HALLUCINATIONS Also feels "funny in the head"  . Hydromorphone Anaphylaxis    Caused patient to have heart attack and crazy in the head  . Other Itching  . Oxycodone-Acetaminophen Itching  . Codeine Hives  and Itching    itching  . Iodinated Diagnostic Agents Other (See Comments) and Hives    Itching and causes me to pass out Must be pre-treated with benadryl. syncope Itching, rash   . Lanolin Hives    "burns skin off"  . Latex Dermatitis    LEAVES REALLY BAD BURN ON SKIN   . Levaquin [Levofloxacin In D5w] Other (See Comments)    "I get real dizzy and pass out"    . Levofloxacin Other (See Comments)    Dizziness, "could not sit up" per pt  . Metrizamide Hives, Itching, Rash and Other (See Comments)    Itching and causes me to pass out  . Nickel Rash  . Penicillins Hives  . Rocephin [Ceftriaxone Sodium In Dextrose] Rash  . Tape Itching    With latex additives.      Review of Systems:  Constitutional:  No  fever, +chills, +recent illness, No unintentional weight changes. +significant fatigue.   HEENT: No  headache, no vision change  Cardiac: No  chest pain, No  pressure, No palpitations, No  Orthopnea  Respiratory:  +shortness of breath. No  Cough  Gastrointestinal: No  abdominal pain, No  nausea  Musculoskeletal: +new myalgia/arthralgia  Skin: No  Rash, +other wounds/concerning lesions  Hem/Onc: No  easy bruising/bleeding  Neurologic: No  weakness, No  dizziness  Psychiatric: No  concerns with depression, No  concerns with anxiety  Exam:  BP (!) 163/81 (BP Location: Left  Arm, Patient Position: Sitting, Cuff Size: Large)   Pulse 72   Temp 99.4 F (37.4 C) (Oral)   Wt 215 lb 8 oz (97.8 kg)   SpO2 95%   BMI 35.86 kg/m   Constitutional: VS see above. General Appearance: alert, well-developed, well-nourished, NAD  Eyes: Normal lids and conjunctive, non-icteric sclera  Ears, Nose, Mouth, Throat: MMM, Normal external inspection ears/nares/mouth/lips/gums.  Neck: No masses, trachea midline. No thyroid enlargement. No tenderness/mass appreciated. No lymphadenopathy  Respiratory: Normal respiratory effort. no wheeze, no rhonchi, no rales faint coarse expiratory breath sounds   Cardiovascular: S1/S2 normal, no murmur, no rub/gallop auscultated. RRR. No lower extremity edema.   Gastrointestinal: Nontender, no masses.   Musculoskeletal: Gait normal.   Neurological: Normal balance/coordination. No tremor.  Skin: warm, dry.  Laceration on left lateral upper thigh, sutures were removed without difficulty, wound had to be anesthetized with 1 cc lidocaine with epinephrine, wound edges debrided a bit where there was an overlying skin flap that I could not be sure whether this was necrotic or whether scabs were particularly stubborn, wound debrided to bleeding edge, Steri-Strips were placed.  Psychiatric: Normal judgment/insight. Normal mood and affect. Oriented x3.    No results found for this or any previous visit (from the past 72 hour(s)).  Dg Chest 2 View  Result Date: 03/09/2018 CLINICAL DATA:  Acute worsening of cough, shortness of breath and fatigue after recent discharge from the hospital for treatment of a community acquired pneumonia. EXAM: CHEST - 2 VIEW COMPARISON:  CTA chest 02/08/2017. Chest x-ray 02/01/2017 and earlier. FINDINGS: Prior sternotomy. Cardiac silhouette upper normal in size, unchanged. Thoracic aorta atherosclerotic, unchanged. Hilar and mediastinal contours otherwise unremarkable. Mildly prominent bronchovascular markings diffusely and  mild central peribronchial thickening, unchanged. Lungs otherwise clear. No localized airspace consolidation. No pleural effusions. No pneumothorax. Normal pulmonary vascularity. DORSAL cord stimulating device again noted. Degenerative changes involving the thoracic and UPPER lumbar spine. IMPRESSION: Stable mild changes of chronic bronchitis and/or asthma. No acute cardiopulmonary disease. Electronically Signed  By: Evangeline Dakin M.D.   On: 03/09/2018 10:45     ASSESSMENT/PLAN: The primary encounter diagnosis was Community acquired pneumonia of right lung, unspecified part of lung. Diagnoses of Diverticulitis, Encounter for removal of sutures, and Acute bronchitis, unspecified organism were also pertinent to this visit.  See physical exam for description of suture removal and wound debridement  Chest x-ray images personally reviewed, radiology over read as above, no active pneumonia.  Refilled ipratropium, patient is allergic to albuterol.  Given fall risk, debility, and plan to go ahead and order home physical therapy to help with strengthening exercises, fall prevention, endurance and gait training  Orders Placed This Encounter  Procedures  . DG Chest 2 View    Meds ordered this encounter  Medications  . ipratropium (ATROVENT HFA) 17 MCG/ACT inhaler    Sig: Inhale 2 puffs into the lungs every 4 (four) hours as needed for wheezing.    Dispense:  12.9 g    Refill:  3    Patient Instructions  Xray did not show any pneumonia Possible asthma Will send refill of inhaler to pharmacy - [facility]   Keep steri-strips in place for the next week or so If they come off sooner I don't think it will be much of a problem Keep dry for first 24 hours  Do not soak in tub         Visit summary with medication list and pertinent instructions was printed for patient to review. All questions at time of visit were answered - patient instructed to contact office with any additional concerns or  updates. ER/RTC precautions were reviewed with the patient.      Please note: voice recognition software was used to produce this document, and typos may escape review. Please contact Dr. Sheppard Coil for any needed clarifications.     Follow-up plan: Return in about 1 week (around 03/16/2018) for recheck wound on L hip, recheck breathing - see me sooner if needed! Marland Kitchen

## 2018-03-09 NOTE — Patient Instructions (Addendum)
Breathing: Xray did not show any pneumonia Possible asthma Will send refill of inhaler to pharmacy - [facility]   L hip wound:  Keep steri-strips in place for the next week or so If they come off sooner I don't think it will be much of a problem Keep dry for first 24 hours  Do not soak in tub

## 2018-03-14 ENCOUNTER — Other Ambulatory Visit: Payer: Self-pay | Admitting: Osteopathic Medicine

## 2018-03-15 ENCOUNTER — Ambulatory Visit (INDEPENDENT_AMBULATORY_CARE_PROVIDER_SITE_OTHER): Payer: Medicare Other

## 2018-03-15 ENCOUNTER — Encounter: Payer: Self-pay | Admitting: Osteopathic Medicine

## 2018-03-15 ENCOUNTER — Ambulatory Visit (INDEPENDENT_AMBULATORY_CARE_PROVIDER_SITE_OTHER): Payer: Medicare Other | Admitting: Osteopathic Medicine

## 2018-03-15 VITALS — BP 126/70 | HR 87 | Temp 98.1°F | Wt 194.4 lb

## 2018-03-15 DIAGNOSIS — R410 Disorientation, unspecified: Secondary | ICD-10-CM | POA: Diagnosis not present

## 2018-03-15 DIAGNOSIS — R1084 Generalized abdominal pain: Secondary | ICD-10-CM

## 2018-03-15 DIAGNOSIS — G44309 Post-traumatic headache, unspecified, not intractable: Secondary | ICD-10-CM | POA: Diagnosis not present

## 2018-03-15 DIAGNOSIS — E559 Vitamin D deficiency, unspecified: Secondary | ICD-10-CM | POA: Diagnosis not present

## 2018-03-15 DIAGNOSIS — E538 Deficiency of other specified B group vitamins: Secondary | ICD-10-CM | POA: Diagnosis not present

## 2018-03-15 DIAGNOSIS — G9340 Encephalopathy, unspecified: Secondary | ICD-10-CM | POA: Diagnosis not present

## 2018-03-15 DIAGNOSIS — R51 Headache: Secondary | ICD-10-CM | POA: Diagnosis not present

## 2018-03-15 DIAGNOSIS — R7989 Other specified abnormal findings of blood chemistry: Secondary | ICD-10-CM | POA: Diagnosis not present

## 2018-03-15 LAB — POCT URINALYSIS DIPSTICK
Blood, UA: NEGATIVE
Glucose, UA: POSITIVE — AB
Leukocytes, UA: NEGATIVE
Nitrite, UA: NEGATIVE
Protein, UA: POSITIVE — AB
Spec Grav, UA: >=1.03 — AB (ref 1.010–1.025)
Urobilinogen, UA: 0.2 E.U./dL
pH, UA: 5.5 (ref 5.0–8.0)

## 2018-03-15 NOTE — Patient Instructions (Addendum)
Delirium  Delirium is a state of mental confusion. It comes on quickly and causes significant changes in a person's thinking and behavior. People with delirium usually have trouble paying attention to what is going on or knowing where they are. They may become very withdrawn or very emotional and unable to sit still. They may even see or feel things that are not there (hallucinations). Delirium is a sign of a serious underlying medical condition.  What are the causes?  Delirium occurs when something suddenly affects the signals that the brain sends out. Brain signals can be affected by anything that puts severe stress on the body and brain and causes brain chemicals to be out of balance. The most common causes of delirium include:  Infections. These may be bacterial, viral, fungal, or protozoal.  Medicines. These include many over-the-counter and prescription medicines.  Recreational drugs.  Substance withdrawal. This occurs with sudden discontinuation of alcohol, certain medicines, or recreational drugs.  Surgery.  Sudden vascular events, such as stroke, brain hemorrhage, and severe migraine.  Other brain disorders, such as tumors, seizures, and physical head trauma.  Metabolic disorders, such as kidney or liver failure.  Low blood oxygen (anoxia). This may occur with lung disease, cardiac arrest, or carbon monoxide poisoning.  Hormone imbalances (endocrinopathies), such as an overactive thyroid (hyperthyroidism) or underactive thyroid (hypothyroidism).  Vitamin deficiencies.  What increases the risk?  This condition is more likely to develop in:  Children.  Older people.  People who live alone.  People who have vision loss or hearing loss.  People who have existing brain disease, such as dementia.  People who have long-lasting (chronic) medical conditions, such as heart disease.  People who are hospitalized for long periods of time.  What are the signs or symptoms?  Delirium starts with a sudden change in a  person's thinking or behavior. Symptoms come and go (fluctuate) over time, and they are often worse at the end of the day. Symptoms include:  Not being able to stay awake (drowsiness) or pay attention.  Being confused about places, time, and people.  Forgetfulness.  Having extreme energy levels. These may be low or high.  Changes in sleep patterns.  Extreme mood swings, such as anger or anxiety.  Focusing on things or ideas that are not important.  Rambling and senseless talking.  Difficulty speaking, understanding speech, or both.  Hallucinations.  Tremor or unsteady gait.  How is this diagnosed?  People with delirium may not realize that they have the condition. Often, a family member or health care provider is the first person to notice the changes. The health care provider will obtain a detailed history of current symptoms, medical issues, medicines, and recreational drug use. The health care provider will perform a mental status examination by:  Asking questions to check for confusion.  Watching for abnormal behavior.  The health care provider may perform a physical exam and order lab tests or additional studies to determine the cause of the delirium.  How is this treated?  Treatment of delirium depends on the cause and severity. Delirium usually goes away within days or weeks of treating the underlying cause. In the meantime, the person should not be left alone because he or she may accidentally cause self-harm. Treatment includes supportive care, such as:  Increased light during the day and decreased light at night.  Low noise level.  Uninterrupted sleep.  A regular daily schedule.  Clocks and calendars to help with orientation.  Familiar objects, including   the person's pictures and clothing.  Frequent visits from familiar family and friends.  Healthy diet.  Exercise.  In more severe cases of delirium, medicine may be prescribed to help the person to keep calm and think more clearly.  Follow these  instructions at home:  Any supportive care should be continued as told by the health care provider.  All medicines should be used as told by the health care provider. This is important.  The health care provider should be consulted before over-the-counter medicines, herbs, or supplements are used.  All follow-up visits should be kept as told by the health care provider. This is important.  Alcohol and recreational drugs should be avoided as told by the health care provider.  Contact a health care provider if:  Symptoms do not get better or they become worse.  New symptoms of delirium develop.  Caring for the person at home does not seem safe.  Eating, drinking, or communicating stops.  There are side effects of medicines, such as changes in sleep patterns, dizziness, weight gain, restlessness, movement changes, or tremors.  Get help right away if:  Serious thoughts occur about self-harm or about hurting others.  There are serious side effects of medicine, such as:  Swelling of the face, lips, tongue, or throat.  Fever, confusion, muscle spasms, or seizures.  This information is not intended to replace advice given to you by your health care provider. Make sure you discuss any questions you have with your health care provider.  Document Released: 09/21/2011 Document Revised: 06/04/2015 Document Reviewed: 02/19/2014  Elsevier Interactive Patient Education  2019 Elsevier Inc[employer]

## 2018-03-15 NOTE — Progress Notes (Signed)
HPI: Carrie Salazar is a 73 y.o. female who  has a past medical history of Chronic fatigue, Depression, Fatigue (08/16/2016), Fibromyalgia, Heart attack ([place]), CABG, Hyperlipidemia, Hypertension, and Migraine.  she presents to [facility] today, 03/15/18,  for chief complaint of: Wound recheck Confusion  Confusion past day or two. Hist head trauma, likely multiple concussions. Chronic issues w/ dizziness and tremor. No new weakness but she feels more unsteady and forgetful. Daughter notes some increased confusion and forgetfulness. Pt feels lightheaded, is eating/drinking normally, still having some abdominal pain and diarrhea (recent hospitalization for colitis). Feels weak on L leg, also has L hip and lower back pain. No new incontinence or saddle anesthesia. Migraine a few days ago but this resolved.    Patient is accompanied by daughter who assists with history-taking.   Past medical, surgical, social and family history reviewed:  Patient Active Problem List   Diagnosis Date Noted  . Multiple falls 03/09/2018  . Hot flashes 10/20/2017  . Presbyopia 08/08/2017  . Puckering of macula, right eye 08/08/2017  . Retinal hemorrhage, right eye 08/08/2017  . Rectal prolapse 07/25/2017  . Palpitations 02/01/2017  . Folate deficiency 02/01/2017  . History of anemia 02/01/2017  . B12 deficiency 02/01/2017  . Migraine   . Hypertension   . Hyperlipidemia   . Hx of CABG   . Heart attack ([place])   . Fibromyalgia   . Depression with anxiety   . Chronic fatigue   . Trochanteric bursitis, left hip 11/30/2016  . Degenerative arthritis of thumb, right 11/30/2016  . Fatigue 08/16/2016  . Vitamin D deficiency 08/09/2016  . Neuropathy 05/24/2016  . Elevated serum creatinine 03/31/2016  . Blood pressure instability 03/31/2016  . Other headache syndrome 02/25/2016  . SOB (shortness of breath) on exertion 01/25/2016  . Anemia 01/25/2016  . Vaginal lesion  21-Feb-202017  . Moderate episode of recurrent major depressive disorder ([place]) 12/10/2015  . Female cystocele 10/16/2015  . Urinary urgency 10/16/2015  . History of obstructive sleep apnea 08/20/2015  . Atrial flutter ([place]) 08/20/2015  . Coronary artery disease involving coronary bypass graft of native heart with angina pectoris ([place]) 08/13/2015  . Chronic back pain 08/13/2015  . Recurrent UTI 08/13/2015  . Insomnia 08/13/2015  . Hyperhidrosis 08/13/2015    Past Surgical History:  Procedure Laterality Date  . ABDOMINAL HYSTERECTOMY    . APPENDECTOMY    . BACK SURGERY    . BACK SURGERY     Lumbar rods  . BLADDER SURGERY    . BREAST BIOPSY Left    x 2  . BREAST EXCISIONAL BIOPSY Left    x 2  . BREAST LUMPECTOMY     left breast all benign (x2)  . CARPAL TUNNEL RELEASE Bilateral   . CORONARY ARTERY BYPASS GRAFT     Triple  . LYMPECTOMY  1980  . TONSILLECTOMY    . VAGINA SURGERY    . WRIST SURGERY      Social History   Tobacco Use  . Smoking status: Former Smoker    Last attempt to quit: 2003    Years since quitting: 17.1  . Smokeless tobacco: Never Used  Substance Use Topics  . Alcohol use: Yes    Comment: 1 q wk    Family History  Problem Relation Age of Onset  . Cancer Mother   . Hyperlipidemia Father   . Hypertension Father   . Stroke Father   . Heart attack Father   . Hyperlipidemia  Maternal Grandmother   . Hypertension Maternal Grandmother   . Stroke Maternal Grandmother   . Hemophilia Maternal Grandfather      Current medication list and allergy/intolerance information reviewed:    Current Outpatient Medications  Medication Sig Dispense Refill  . acetaZOLAMIDE (DIAMOX) 125 MG tablet TAKE 1 TABLET(125 MG) BY MOUTH THREE TIMES DAILY 90 tablet 0  . aspirin EC 81 MG tablet Take 1 tablet (81 mg total) by mouth daily.    . cetirizine (ZYRTEC) 5 MG tablet Take 1 tablet (5 mg total) by mouth daily. (Patient taking differently: Take 5 mg by mouth as needed. )  30 tablet 0  . cholecalciferol (VITAMIN D) 1000 units tablet Take 1,000 Units by mouth daily.    Marland Kitchen escitalopram (LEXAPRO) 20 MG tablet Take 1 tablet (20 mg total) by mouth daily. 90 tablet 3  . estradiol (ESTRACE) 0.1 MG/GM vaginal cream INSERT 1/2 TO 1 APPLICATORFUL VAGINALLY 3 TIMES A WEEK 42.5 g 0  . estradiol (ESTRACE) 1 MG tablet Take 1 tablet (1 mg total) by mouth daily. 90 tablet 3  . Evolocumab with Infusor ([place]) 420 MG/3.5ML SOCT Inject 420 mg into the skin every 30 (thirty) days. 1 Cartridge 11  . fenofibrate (TRICOR) 48 MG tablet TAKE 1 TABLET(48 MG) BY MOUTH DAILY 90 tablet 1  . gabapentin (NEURONTIN) 600 MG tablet TAKE 1 TABLET BY MOUTH IN THE MORNING, 1 TABLET IN THE AFTERNOON, AND 2 TABLETS IN THE EVENING( 1200 MG TOTAL PER DAY) 120 tablet 3  . ipratropium (ATROVENT HFA) 17 MCG/ACT inhaler Inhale 2 puffs into the lungs every 4 (four) hours as needed for wheezing. 12.9 g 3  . lisinopril (PRINIVIL,ZESTRIL) 40 MG tablet Take 1 tablet (40 mg total) by mouth daily. 90 tablet 3  . metoprolol succinate (TOPROL XL) 25 MG 24 hr tablet Take 0.5 tablets (12.5 mg total) by mouth daily. 45 tablet 3  . Multiple Vitamins-Minerals (HAIR SKIN AND NAILS FORMULA PO) Take 1 tablet by mouth 2 (two) times daily.    . nitroGLYCERIN (NITROSTAT) 0.4 MG SL tablet Place 1 tablet (0.4 mg total) under the tongue every 5 (five) minutes as needed for chest pain. 25 tablet 12  . ondansetron (ZOFRAN-ODT) 8 MG disintegrating tablet Take 1 tablet (8 mg total) by mouth every 8 (eight) hours as needed for nausea. 20 tablet 3  . ranolazine (RANEXA) 500 MG 12 hr tablet TAKE 1 TABLET BY MOUTH TWO  TIMES DAILY 180 tablet 3   No current facility-administered medications for this visit.     Allergies  Allergen Reactions  . Albuterol Palpitations and Other (See Comments)    HALLUCINATIONS Also feels "funny in the head"  . Hydromorphone Anaphylaxis    Caused patient to have heart attack and crazy in  the head  . Oxycodone-Acetaminophen Itching  . Codeine Hives and Itching    itching  . Iodinated Diagnostic Agents Other (See Comments) and Hives    Itching and causes me to pass out Must be pre-treated with benadryl. syncope Itching, rash   . Lanolin Hives    "burns skin off"  . Latex Dermatitis    LEAVES REALLY BAD BURN ON SKIN   . Levaquin [Levofloxacin In D5w] Other (See Comments)    "I get real dizzy and pass out"    . Levofloxacin Other (See Comments)    Dizziness, "could not sit up" per pt  . Metrizamide Hives, Itching, Rash and Other (See Comments)    Itching and causes  me to pass out  . Nickel Rash  . Penicillins Hives  . Rocephin [Ceftriaxone Sodium In Dextrose] Rash  . Tape Itching    With latex additives.      Review of Systems:  Constitutional:  No  fever, +chills, +recent illness, No unintentional weight changes. +significant fatigue.   HEENT: No  headache, no vision change, no hearing change, No sore throat, No  sinus pressure  Cardiac: No  chest pain, No  pressure, No palpitations, No  Orthopnea  Respiratory:  No  shortness of breath. No  Cough  Gastrointestinal: No  abdominal pain, No  nausea, No  vomiting,  No  blood in stool, No  diarrhea, No  constipation   Musculoskeletal: No new myalgia/arthralgia  Skin: No  Rash, No other wounds/concerning lesions  Genitourinary: No  incontinence, No  abnormal genital bleeding, No abnormal genital discharge  Hem/Onc: No  easy bruising/bleeding, No  abnormal lymph node  Endocrine: No cold intolerance,  No heat intolerance. No polyuria/polydipsia/polyphagia   Neurologic: No  weakness, No  dizziness, No  slurred speech/focal weakness/facial droop  Psychiatric: No  concerns with depression, No  concerns with anxiety, No sleep problems, No mood problems  Exam:  BP 126/70 (BP Location: Left Arm, Patient Position: Sitting, Cuff Size: Large)   Pulse 87   Temp 98.1 F (36.7 C) (Oral)   Wt 194 lb 6.4 oz (88.2  kg)   SpO2 96%   BMI 32.35 kg/m   BP Readings from Last 3 Encounters:  03/15/18 126/70  03/09/18 (!) 163/81  02/23/18 (!) 147/70   Wt Readings from Last 3 Encounters:  03/15/18 194 lb 6.4 oz (88.2 kg)  03/09/18 215 lb 8 oz (97.8 kg)  02/23/18 204 lb (92.5 kg)  ?scale error? Patient notes no weight loss   Constitutional: VS see above. General Appearance: alert, well-developed, well-nourished, NAD  Eyes: Normal lids and conjunctive, non-icteric sclera, EOMI, PERRL, no nystagmus   Ears, Nose, Mouth, Throat: MMM, Normal external inspection ears/nares/mouth/lips/gums.   Neck: No masses, trachea midline. No thyroid enlargement. No tenderness/mass appreciated. No lymphadenopathy  Respiratory: Normal respiratory effort. no wheeze, no rhonchi, no rales  Cardiovascular: S1/S2 normal, no murmur, no rub/gallop auscultated. RRR. No lower extremity edema.   Gastrointestinal: Nontender, no masses. No hepatomegaly, no splenomegaly. No hernia appreciated. Bowel sounds normal. Rectal exam deferred.   Musculoskeletal: Gait antalgic favoring left. Strength 4/4 all extremities, maybe a bit less on L leg (hip flexion and knee flexion/extension) than on R.   Neurological: Normal balance/coordination. +tremor. No cranial nerve deficit on limited exam. Motor and sensation intact and symmetric.   Skin: warm, dry. No rash/ulcer.  Wound healing on L hip   Psychiatric: Normal judgment/insight. Normal mood and affect. Oriented x3.    Results for orders placed or performed in visit on 03/15/18 (from the past 24 hour(s))  POCT Urinalysis Dipstick     Status: Abnormal   Collection Time: 03/15/18  3:27 PM  Result Value Ref Range   Color, UA amber    Clarity, UA clear    Glucose, UA Positive (A) Negative   Bilirubin, UA small    Ketones, UA trace    Spec Grav, UA >=1.030 (A) 1.010 - 1.025   Blood, UA negative    pH, UA 5.5 5.0 - 8.0   Protein, UA Positive (A) Negative   Urobilinogen, UA 0.2 0.2 or  1.0 E.U./dL   Nitrite, UA negative    Leukocytes, UA Negative Negative   Appearance  Odor      EKG interpretation: Rate: 75 Rhythm: sinus No ST/T changes concerning for acute ischemia/infarct     ASSESSMENT/PLAN: The primary encounter diagnosis was Confusion. Diagnoses of Encephalopathy and Generalized abdominal pain were also pertinent to this visit.   CT now to eval brain  Can't MRI w/ spinal stim in place   Labs as below  Strong consideration for neuro referral  To me, patient seems maybe slightly worse from baseline in terms of balance, but overall demeanor and physical exam is same as usual, no severe worsening, I think we can get more information before we get too worried about emergency treatment   Orders Placed This Encounter  Procedures  . Urine Culture  . CT Head Wo Contrast  . COMPLETE METABOLIC PANEL WITH GFR  . TSH  . Magnesium  . Lipase  . CBC with Differential/Platelet  . B12 and Folate Panel  . POCT Urinalysis Dipstick  . EKG 12-Lead    No orders of the defined types were placed in this encounter.   Patient Instructions  Delirium Delirium is a state of mental confusion. It comes on quickly and causes significant changes in a person's thinking and behavior. People with delirium usually have trouble paying attention to what is going on or knowing where they are. They may become very withdrawn or very emotional and unable to sit still. They may even see or feel things that are not there (hallucinations). Delirium is a sign of a serious underlying medical condition. What are the causes? Delirium occurs when something suddenly affects the signals that the brain sends out. Brain signals can be affected by anything that puts severe stress on the body and brain and causes brain chemicals to be out of balance. The most common causes of delirium include:  Infections. These may be bacterial, viral, fungal, or protozoal.  Medicines. These include many  over-the-counter and prescription medicines.  Recreational drugs.  Substance withdrawal. This occurs with sudden discontinuation of alcohol, certain medicines, or recreational drugs.  Surgery.  Sudden vascular events, such as stroke, brain hemorrhage, and severe migraine.  Other brain disorders, such as tumors, seizures, and physical head trauma.  Metabolic disorders, such as kidney or liver failure.  Low blood oxygen (anoxia). This may occur with lung disease, cardiac arrest, or carbon monoxide poisoning.  Hormone imbalances (endocrinopathies), such as an overactive thyroid (hyperthyroidism) or underactive thyroid (hypothyroidism).  Vitamin deficiencies. What increases the risk? This condition is more likely to develop in:  Children.  Older people.  People who live alone.  People who have vision loss or hearing loss.  People who have existing brain disease, such as dementia.  People who have long-lasting (chronic) medical conditions, such as heart disease.  People who are hospitalized for long periods of time. What are the signs or symptoms? Delirium starts with a sudden change in a person's thinking or behavior. Symptoms come and go (fluctuate) over time, and they are often worse at the end of the day. Symptoms include:  Not being able to stay awake (drowsiness) or pay attention.  Being confused about places, time, and people.  Forgetfulness.  Having extreme energy levels. These may be low or high.  Changes in sleep patterns.  Extreme mood swings, such as anger or anxiety.  Focusing on things or ideas that are not important.  Rambling and senseless talking.  Difficulty speaking, understanding speech, or both.  Hallucinations.  Tremor or unsteady gait. How is this diagnosed? People with delirium may not  realize that they have the condition. Often, a family member or health care provider is the first person to notice the changes. The health care provider  will obtain a detailed history of current symptoms, medical issues, medicines, and recreational drug use. The health care provider will perform a mental status examination by:  Asking questions to check for confusion.  Watching for abnormal behavior. The health care provider may perform a physical exam and order lab tests or additional studies to determine the cause of the delirium. How is this treated? Treatment of delirium depends on the cause and severity. Delirium usually goes away within days or weeks of treating the underlying cause. In the meantime, the person should not be left alone because he or she may accidentally cause self-harm. Treatment includes supportive care, such as:  Increased light during the day and decreased light at night.  Low noise level.  Uninterrupted sleep.  A regular daily schedule.  Clocks and calendars to help with orientation.  Familiar objects, including the person's pictures and clothing.  Frequent visits from familiar family and friends.  Healthy diet.  Exercise. In more severe cases of delirium, medicine may be prescribed to help the person to keep calm and think more clearly. Follow these instructions at home:  Any supportive care should be continued as told by the health care provider.  All medicines should be used as told by the health care provider. This is important.  The health care provider should be consulted before over-the-counter medicines, herbs, or supplements are used.  All follow-up visits should be kept as told by the health care provider. This is important.  Alcohol and recreational drugs should be avoided as told by the health care provider. Contact a health care provider if:  Symptoms do not get better or they become worse.  New symptoms of delirium develop.  Caring for the person at home does not seem safe.  Eating, drinking, or communicating stops.  There are side effects of medicines, such as changes in sleep  patterns, dizziness, weight gain, restlessness, movement changes, or tremors. Get help right away if:  Serious thoughts occur about self-harm or about hurting others.  There are serious side effects of medicine, such as: ? Swelling of the face, lips, tongue, or throat. ? Fever, confusion, muscle spasms, or seizures. This information is not intended to replace advice given to you by your health care provider. Make sure you discuss any questions you have with your health care provider. Document Released: 09/21/2011 Document Revised: 06/04/2015 Document Reviewed: 02/19/2014 Elsevier Interactive Patient Education  2019 [employer].         Visit summary with medication list and pertinent instructions was printed for patient to review. All questions at time of visit were answered - patient instructed to contact office with any additional concerns or updates. ER/RTC precautions were reviewed with the patient.   Note: Total time spent 40 minutes, greater than 50% of the visit was spent face-to-face counseling and coordinating care for the above diagnoses listed in assessment/plan.   Please note: voice recognition software was used to produce this document, and typos may escape review. Please contact Dr. Sheppard Coil for any needed clarifications.     Follow-up plan: Return for recheck depending on symptoms and results .

## 2018-03-16 ENCOUNTER — Other Ambulatory Visit: Payer: Self-pay | Admitting: Osteopathic Medicine

## 2018-03-16 DIAGNOSIS — R1084 Generalized abdominal pain: Secondary | ICD-10-CM | POA: Diagnosis not present

## 2018-03-16 DIAGNOSIS — R410 Disorientation, unspecified: Secondary | ICD-10-CM | POA: Diagnosis not present

## 2018-03-16 DIAGNOSIS — G9340 Encephalopathy, unspecified: Secondary | ICD-10-CM | POA: Diagnosis not present

## 2018-03-16 LAB — COMPLETE METABOLIC PANEL WITH GFR
AG Ratio: 1.4 (calc) (ref 1.0–2.5)
ALBUMIN MSPROF: 4.1 g/dL (ref 3.6–5.1)
ALKALINE PHOSPHATASE (APISO): 48 U/L (ref 37–153)
ALT: 13 U/L (ref 6–29)
AST: 18 U/L (ref 10–35)
BUN/Creatinine Ratio: 14 (calc) (ref 6–22)
BUN: 18 mg/dL (ref 7–25)
CO2: 28 mmol/L (ref 20–32)
Calcium: 9.7 mg/dL (ref 8.6–10.4)
Chloride: 102 mmol/L (ref 98–110)
Creat: 1.3 mg/dL — ABNORMAL HIGH (ref 0.60–0.93)
GFR, Est African American: 47 mL/min/{1.73_m2} — ABNORMAL LOW (ref >=60)
GFR, Est Non African American: 40 mL/min/{1.73_m2} — ABNORMAL LOW (ref >=60)
GLOBULIN: 3 g/dL (ref 1.9–3.7)
Glucose, Bld: 87 mg/dL (ref 65–99)
Potassium: 4.9 mmol/L (ref 3.5–5.3)
Sodium: 138 mmol/L (ref 135–146)
Total Bilirubin: 0.4 mg/dL (ref 0.2–1.2)
Total Protein: 7.1 g/dL (ref 6.1–8.1)

## 2018-03-16 LAB — CBC WITH DIFFERENTIAL/PLATELET
Absolute Monocytes: 589 cells/uL (ref 200–950)
BASOS ABS: 50 {cells}/uL (ref 0–200)
Basophils Relative: 0.7 %
Eosinophils Absolute: 142 cells/uL (ref 15–500)
Eosinophils Relative: 2 %
HCT: 41.6 % (ref 35.0–45.0)
Hemoglobin: 13.9 g/dL (ref 11.7–15.5)
Lymphs Abs: 1690 cells/uL (ref 850–3900)
MCH: 31 pg (ref 27.0–33.0)
MCHC: 33.4 g/dL (ref 32.0–36.0)
MCV: 92.7 fL (ref 80.0–100.0)
MPV: 9.2 fL (ref 7.5–12.5)
Monocytes Relative: 8.3 %
NEUTROS ABS: 4629 {cells}/uL (ref 1500–7800)
Neutrophils Relative %: 65.2 %
Platelets: 353 10*3/uL (ref 140–400)
RBC: 4.49 10*6/uL (ref 3.80–5.10)
RDW: 12.8 % (ref 11.0–15.0)
Total Lymphocyte: 23.8 %
WBC: 7.1 10*3/uL (ref 3.8–10.8)

## 2018-03-16 LAB — B12 AND FOLATE PANEL
Folate: 11.3 ng/mL
Vitamin B-12: >2000 pg/mL — ABNORMAL HIGH (ref 200–1100)

## 2018-03-16 LAB — MAGNESIUM: MAGNESIUM: 2 mg/dL (ref 1.5–2.5)

## 2018-03-16 LAB — LIPASE: Lipase: 35 U/L (ref 7–60)

## 2018-03-16 LAB — TSH: TSH: 1.42 m[IU]/L (ref 0.40–4.50)

## 2018-03-16 NOTE — Telephone Encounter (Signed)
Please review for refill- patient at [facility] 

## 2018-03-17 LAB — URINE CULTURE
MICRO NUMBER:: 288489
SPECIMEN QUALITY:: ADEQUATE

## 2018-03-21 LAB — URINE CULTURE

## 2018-03-27 ENCOUNTER — Telehealth: Payer: Self-pay

## 2018-03-27 DIAGNOSIS — R531 Weakness: Secondary | ICD-10-CM | POA: Diagnosis not present

## 2018-03-27 DIAGNOSIS — G8929 Other chronic pain: Secondary | ICD-10-CM | POA: Diagnosis not present

## 2018-03-27 DIAGNOSIS — I10 Essential (primary) hypertension: Secondary | ICD-10-CM | POA: Diagnosis not present

## 2018-03-27 DIAGNOSIS — M549 Dorsalgia, unspecified: Secondary | ICD-10-CM | POA: Diagnosis not present

## 2018-03-27 DIAGNOSIS — R7881 Bacteremia: Secondary | ICD-10-CM | POA: Diagnosis not present

## 2018-03-27 DIAGNOSIS — I4892 Unspecified atrial flutter: Secondary | ICD-10-CM | POA: Diagnosis not present

## 2018-03-27 DIAGNOSIS — K5730 Diverticulosis of large intestine without perforation or abscess without bleeding: Secondary | ICD-10-CM | POA: Diagnosis not present

## 2018-03-27 DIAGNOSIS — J8410 Pulmonary fibrosis, unspecified: Secondary | ICD-10-CM | POA: Diagnosis not present

## 2018-03-27 DIAGNOSIS — N390 Urinary tract infection, site not specified: Secondary | ICD-10-CM | POA: Diagnosis not present

## 2018-03-27 DIAGNOSIS — I252 Old myocardial infarction: Secondary | ICD-10-CM | POA: Diagnosis not present

## 2018-03-27 DIAGNOSIS — Z1611 Resistance to penicillins: Secondary | ICD-10-CM | POA: Diagnosis not present

## 2018-03-27 DIAGNOSIS — J449 Chronic obstructive pulmonary disease, unspecified: Secondary | ICD-10-CM | POA: Diagnosis not present

## 2018-03-27 DIAGNOSIS — Z91041 Radiographic dye allergy status: Secondary | ICD-10-CM | POA: Diagnosis not present

## 2018-03-27 DIAGNOSIS — N12 Tubulo-interstitial nephritis, not specified as acute or chronic: Secondary | ICD-10-CM | POA: Diagnosis not present

## 2018-03-27 DIAGNOSIS — Z881 Allergy status to other antibiotic agents status: Secondary | ICD-10-CM | POA: Diagnosis not present

## 2018-03-27 DIAGNOSIS — R197 Diarrhea, unspecified: Secondary | ICD-10-CM | POA: Diagnosis not present

## 2018-03-27 DIAGNOSIS — Z91040 Latex allergy status: Secondary | ICD-10-CM | POA: Diagnosis not present

## 2018-03-27 DIAGNOSIS — M797 Fibromyalgia: Secondary | ICD-10-CM | POA: Diagnosis not present

## 2018-03-27 DIAGNOSIS — R55 Syncope and collapse: Secondary | ICD-10-CM | POA: Diagnosis not present

## 2018-03-27 DIAGNOSIS — G629 Polyneuropathy, unspecified: Secondary | ICD-10-CM | POA: Diagnosis not present

## 2018-03-27 DIAGNOSIS — N179 Acute kidney failure, unspecified: Secondary | ICD-10-CM | POA: Diagnosis not present

## 2018-03-27 DIAGNOSIS — E559 Vitamin D deficiency, unspecified: Secondary | ICD-10-CM | POA: Diagnosis not present

## 2018-03-27 DIAGNOSIS — I517 Cardiomegaly: Secondary | ICD-10-CM | POA: Diagnosis not present

## 2018-03-27 DIAGNOSIS — Z1629 Resistance to other single specified antibiotic: Secondary | ICD-10-CM | POA: Diagnosis not present

## 2018-03-27 DIAGNOSIS — R945 Abnormal results of liver function studies: Secondary | ICD-10-CM | POA: Diagnosis not present

## 2018-03-27 DIAGNOSIS — I2510 Atherosclerotic heart disease of native coronary artery without angina pectoris: Secondary | ICD-10-CM | POA: Diagnosis not present

## 2018-03-27 DIAGNOSIS — R918 Other nonspecific abnormal finding of lung field: Secondary | ICD-10-CM | POA: Diagnosis not present

## 2018-03-27 DIAGNOSIS — J189 Pneumonia, unspecified organism: Secondary | ICD-10-CM | POA: Diagnosis not present

## 2018-03-27 DIAGNOSIS — G4733 Obstructive sleep apnea (adult) (pediatric): Secondary | ICD-10-CM | POA: Diagnosis not present

## 2018-03-27 DIAGNOSIS — N3000 Acute cystitis without hematuria: Secondary | ICD-10-CM | POA: Diagnosis not present

## 2018-03-27 DIAGNOSIS — N200 Calculus of kidney: Secondary | ICD-10-CM | POA: Diagnosis not present

## 2018-03-27 DIAGNOSIS — B9620 Unspecified Escherichia coli [E. coli] as the cause of diseases classified elsewhere: Secondary | ICD-10-CM | POA: Diagnosis not present

## 2018-03-27 NOTE — Telephone Encounter (Signed)
Pt's husband called concerned because starting last night pt had sudden onset of confusion, loss of balance, inability to walk, cant get to the bathroom, and husband describes her as "idle in the head".   No trauma no issues with onset. Pt's husband concerned it may be due to her recently starting repatha. Wants to know if it is okay for him to take her to ER to be evaluated.   Discussed pt's SX with provider and she agrees OK for pt's husband to take her to ER for evaluation.

## 2018-04-02 ENCOUNTER — Telehealth: Payer: Self-pay

## 2018-04-02 DIAGNOSIS — H538 Other visual disturbances: Secondary | ICD-10-CM

## 2018-04-02 NOTE — Telephone Encounter (Signed)
Linea reports blurred vision. She states when she looks in the mirror she can only see half of herself. Dr Madilyn Fireman advised to do a referral. Referral sent.

## 2018-04-03 DIAGNOSIS — H35033 Hypertensive retinopathy, bilateral: Secondary | ICD-10-CM | POA: Diagnosis not present

## 2018-04-04 ENCOUNTER — Telehealth: Payer: Self-pay

## 2018-04-04 ENCOUNTER — Other Ambulatory Visit: Payer: Self-pay | Admitting: Osteopathic Medicine

## 2018-04-04 MED ORDER — OMEPRAZOLE 20 MG PO CPDR: 20.0000 mg | DELAYED_RELEASE_CAPSULE | Freq: Every day | ORAL | 0 refills | Status: DC

## 2018-04-04 NOTE — Telephone Encounter (Signed)
Pt has been updated of provider's note. Unable to send refill for omeprazole. Not listed in current med list. Pls send rx to [facility] in [facility]. Thanks.

## 2018-04-04 NOTE — Telephone Encounter (Signed)
Noted. Pt has been updated. No other inquiries during call.

## 2018-04-04 NOTE — Telephone Encounter (Signed)
Ok to refill. Tums or Pepto Bismol might help more quickly. Taking the medicine with meals should also help.

## 2018-04-04 NOTE — Telephone Encounter (Signed)
Sorry didn't realize! Med Rx sent.

## 2018-04-04 NOTE — Telephone Encounter (Signed)
Pt called stating that cefdinir 300 mg is giving pt severe heartburn. As per pt, every time she takes antibiotic she starts getting heartburn and severe pain from stomach acid. Would like to know if she can take omeprazole, if so requesting a refill to be sent to [facility]. Pls advise, thanks.

## 2018-04-16 ENCOUNTER — Telehealth: Payer: Self-pay

## 2018-04-16 DIAGNOSIS — I252 Old myocardial infarction: Secondary | ICD-10-CM | POA: Diagnosis not present

## 2018-04-16 DIAGNOSIS — Z87440 Personal history of urinary (tract) infections: Secondary | ICD-10-CM | POA: Diagnosis not present

## 2018-04-16 DIAGNOSIS — Z7982 Long term (current) use of aspirin: Secondary | ICD-10-CM | POA: Diagnosis not present

## 2018-04-16 DIAGNOSIS — R42 Dizziness and giddiness: Secondary | ICD-10-CM | POA: Diagnosis not present

## 2018-04-16 DIAGNOSIS — Z951 Presence of aortocoronary bypass graft: Secondary | ICD-10-CM | POA: Diagnosis not present

## 2018-04-16 DIAGNOSIS — E785 Hyperlipidemia, unspecified: Secondary | ICD-10-CM | POA: Diagnosis not present

## 2018-04-16 DIAGNOSIS — R296 Repeated falls: Secondary | ICD-10-CM | POA: Diagnosis not present

## 2018-04-16 DIAGNOSIS — N12 Tubulo-interstitial nephritis, not specified as acute or chronic: Secondary | ICD-10-CM | POA: Diagnosis not present

## 2018-04-16 DIAGNOSIS — G629 Polyneuropathy, unspecified: Secondary | ICD-10-CM | POA: Diagnosis not present

## 2018-04-16 DIAGNOSIS — G8929 Other chronic pain: Secondary | ICD-10-CM | POA: Diagnosis not present

## 2018-04-16 DIAGNOSIS — Z87891 Personal history of nicotine dependence: Secondary | ICD-10-CM | POA: Diagnosis not present

## 2018-04-16 DIAGNOSIS — I2510 Atherosclerotic heart disease of native coronary artery without angina pectoris: Secondary | ICD-10-CM | POA: Diagnosis not present

## 2018-04-16 DIAGNOSIS — N3000 Acute cystitis without hematuria: Secondary | ICD-10-CM | POA: Diagnosis not present

## 2018-04-16 DIAGNOSIS — I10 Essential (primary) hypertension: Secondary | ICD-10-CM | POA: Diagnosis not present

## 2018-04-16 DIAGNOSIS — I4892 Unspecified atrial flutter: Secondary | ICD-10-CM | POA: Diagnosis not present

## 2018-04-16 DIAGNOSIS — M797 Fibromyalgia: Secondary | ICD-10-CM | POA: Diagnosis not present

## 2018-04-16 DIAGNOSIS — M549 Dorsalgia, unspecified: Secondary | ICD-10-CM | POA: Diagnosis not present

## 2018-04-16 DIAGNOSIS — B9620 Unspecified Escherichia coli [E. coli] as the cause of diseases classified elsewhere: Secondary | ICD-10-CM | POA: Diagnosis not present

## 2018-04-16 NOTE — Telephone Encounter (Signed)
Carrie Salazar from [facility] called regarding at home physical therapy. As per nurse, requesting a verbal order for physical therapy once a wk/4wks. Physical therapy will be for balance, stamina, gait strengthening. Verbal order given to proceed with home physical therapy. No other inquiries during call.

## 2018-04-24 DIAGNOSIS — B9620 Unspecified Escherichia coli [E. coli] as the cause of diseases classified elsewhere: Secondary | ICD-10-CM | POA: Diagnosis not present

## 2018-04-24 DIAGNOSIS — Z951 Presence of aortocoronary bypass graft: Secondary | ICD-10-CM | POA: Diagnosis not present

## 2018-04-24 DIAGNOSIS — G8929 Other chronic pain: Secondary | ICD-10-CM | POA: Diagnosis not present

## 2018-04-24 DIAGNOSIS — M797 Fibromyalgia: Secondary | ICD-10-CM | POA: Diagnosis not present

## 2018-04-24 DIAGNOSIS — E785 Hyperlipidemia, unspecified: Secondary | ICD-10-CM | POA: Diagnosis not present

## 2018-04-24 DIAGNOSIS — Z87440 Personal history of urinary (tract) infections: Secondary | ICD-10-CM | POA: Diagnosis not present

## 2018-04-24 DIAGNOSIS — N3000 Acute cystitis without hematuria: Secondary | ICD-10-CM | POA: Diagnosis not present

## 2018-04-24 DIAGNOSIS — I2510 Atherosclerotic heart disease of native coronary artery without angina pectoris: Secondary | ICD-10-CM | POA: Diagnosis not present

## 2018-04-24 DIAGNOSIS — R296 Repeated falls: Secondary | ICD-10-CM | POA: Diagnosis not present

## 2018-04-24 DIAGNOSIS — N12 Tubulo-interstitial nephritis, not specified as acute or chronic: Secondary | ICD-10-CM | POA: Diagnosis not present

## 2018-04-24 DIAGNOSIS — Z87891 Personal history of nicotine dependence: Secondary | ICD-10-CM | POA: Diagnosis not present

## 2018-04-24 DIAGNOSIS — G629 Polyneuropathy, unspecified: Secondary | ICD-10-CM | POA: Diagnosis not present

## 2018-04-24 DIAGNOSIS — I10 Essential (primary) hypertension: Secondary | ICD-10-CM | POA: Diagnosis not present

## 2018-04-24 DIAGNOSIS — I4892 Unspecified atrial flutter: Secondary | ICD-10-CM | POA: Diagnosis not present

## 2018-04-24 DIAGNOSIS — I252 Old myocardial infarction: Secondary | ICD-10-CM | POA: Diagnosis not present

## 2018-04-24 DIAGNOSIS — M549 Dorsalgia, unspecified: Secondary | ICD-10-CM | POA: Diagnosis not present

## 2018-04-24 DIAGNOSIS — Z7982 Long term (current) use of aspirin: Secondary | ICD-10-CM | POA: Diagnosis not present

## 2018-04-24 DIAGNOSIS — R42 Dizziness and giddiness: Secondary | ICD-10-CM | POA: Diagnosis not present

## 2018-04-29 ENCOUNTER — Other Ambulatory Visit: Payer: Self-pay | Admitting: Osteopathic Medicine

## 2018-04-30 DIAGNOSIS — R296 Repeated falls: Secondary | ICD-10-CM | POA: Diagnosis not present

## 2018-04-30 DIAGNOSIS — I10 Essential (primary) hypertension: Secondary | ICD-10-CM | POA: Diagnosis not present

## 2018-04-30 DIAGNOSIS — I2510 Atherosclerotic heart disease of native coronary artery without angina pectoris: Secondary | ICD-10-CM | POA: Diagnosis not present

## 2018-04-30 DIAGNOSIS — B9620 Unspecified Escherichia coli [E. coli] as the cause of diseases classified elsewhere: Secondary | ICD-10-CM | POA: Diagnosis not present

## 2018-04-30 DIAGNOSIS — M797 Fibromyalgia: Secondary | ICD-10-CM | POA: Diagnosis not present

## 2018-04-30 DIAGNOSIS — Z951 Presence of aortocoronary bypass graft: Secondary | ICD-10-CM | POA: Diagnosis not present

## 2018-04-30 DIAGNOSIS — I4892 Unspecified atrial flutter: Secondary | ICD-10-CM | POA: Diagnosis not present

## 2018-04-30 DIAGNOSIS — Z87440 Personal history of urinary (tract) infections: Secondary | ICD-10-CM | POA: Diagnosis not present

## 2018-04-30 DIAGNOSIS — Z87891 Personal history of nicotine dependence: Secondary | ICD-10-CM | POA: Diagnosis not present

## 2018-04-30 DIAGNOSIS — M549 Dorsalgia, unspecified: Secondary | ICD-10-CM | POA: Diagnosis not present

## 2018-04-30 DIAGNOSIS — E785 Hyperlipidemia, unspecified: Secondary | ICD-10-CM | POA: Diagnosis not present

## 2018-04-30 DIAGNOSIS — R42 Dizziness and giddiness: Secondary | ICD-10-CM | POA: Diagnosis not present

## 2018-04-30 DIAGNOSIS — G8929 Other chronic pain: Secondary | ICD-10-CM | POA: Diagnosis not present

## 2018-04-30 DIAGNOSIS — Z7982 Long term (current) use of aspirin: Secondary | ICD-10-CM | POA: Diagnosis not present

## 2018-04-30 DIAGNOSIS — N3000 Acute cystitis without hematuria: Secondary | ICD-10-CM | POA: Diagnosis not present

## 2018-04-30 DIAGNOSIS — I252 Old myocardial infarction: Secondary | ICD-10-CM | POA: Diagnosis not present

## 2018-04-30 DIAGNOSIS — N12 Tubulo-interstitial nephritis, not specified as acute or chronic: Secondary | ICD-10-CM | POA: Diagnosis not present

## 2018-04-30 DIAGNOSIS — G629 Polyneuropathy, unspecified: Secondary | ICD-10-CM | POA: Diagnosis not present

## 2018-05-01 ENCOUNTER — Telehealth: Payer: Self-pay | Admitting: Osteopathic Medicine

## 2018-05-01 ENCOUNTER — Other Ambulatory Visit: Payer: Self-pay | Admitting: Osteopathic Medicine

## 2018-05-01 DIAGNOSIS — M549 Dorsalgia, unspecified: Secondary | ICD-10-CM | POA: Diagnosis not present

## 2018-05-01 DIAGNOSIS — Z87440 Personal history of urinary (tract) infections: Secondary | ICD-10-CM | POA: Diagnosis not present

## 2018-05-01 DIAGNOSIS — Z7982 Long term (current) use of aspirin: Secondary | ICD-10-CM | POA: Diagnosis not present

## 2018-05-01 DIAGNOSIS — Z951 Presence of aortocoronary bypass graft: Secondary | ICD-10-CM | POA: Diagnosis not present

## 2018-05-01 DIAGNOSIS — I4892 Unspecified atrial flutter: Secondary | ICD-10-CM | POA: Diagnosis not present

## 2018-05-01 DIAGNOSIS — R42 Dizziness and giddiness: Secondary | ICD-10-CM | POA: Diagnosis not present

## 2018-05-01 DIAGNOSIS — E785 Hyperlipidemia, unspecified: Secondary | ICD-10-CM | POA: Diagnosis not present

## 2018-05-01 DIAGNOSIS — N12 Tubulo-interstitial nephritis, not specified as acute or chronic: Secondary | ICD-10-CM | POA: Diagnosis not present

## 2018-05-01 DIAGNOSIS — M797 Fibromyalgia: Secondary | ICD-10-CM | POA: Diagnosis not present

## 2018-05-01 DIAGNOSIS — I252 Old myocardial infarction: Secondary | ICD-10-CM | POA: Diagnosis not present

## 2018-05-01 DIAGNOSIS — G629 Polyneuropathy, unspecified: Secondary | ICD-10-CM | POA: Diagnosis not present

## 2018-05-01 DIAGNOSIS — I2510 Atherosclerotic heart disease of native coronary artery without angina pectoris: Secondary | ICD-10-CM | POA: Diagnosis not present

## 2018-05-01 DIAGNOSIS — G8929 Other chronic pain: Secondary | ICD-10-CM | POA: Diagnosis not present

## 2018-05-01 DIAGNOSIS — N3000 Acute cystitis without hematuria: Secondary | ICD-10-CM | POA: Diagnosis not present

## 2018-05-01 DIAGNOSIS — B9620 Unspecified Escherichia coli [E. coli] as the cause of diseases classified elsewhere: Secondary | ICD-10-CM | POA: Diagnosis not present

## 2018-05-01 DIAGNOSIS — Z87891 Personal history of nicotine dependence: Secondary | ICD-10-CM | POA: Diagnosis not present

## 2018-05-01 DIAGNOSIS — I10 Essential (primary) hypertension: Secondary | ICD-10-CM | POA: Diagnosis not present

## 2018-05-01 DIAGNOSIS — R296 Repeated falls: Secondary | ICD-10-CM | POA: Diagnosis not present

## 2018-05-01 NOTE — Telephone Encounter (Signed)
OK to refill but she does need a visit for hospital f/u

## 2018-05-01 NOTE — Telephone Encounter (Signed)
Received call from Henderson Newcomer with [facility] OT requesting the following orders:  Home health OT 2x week for 1 week, then 1x week for 5 weeks. This is to help with ADL's, memory impairment, balance, and vision disturbance Carrie Salazar states Pt has a pretty severe left field deficient, resembles that of a stroke Pt). Verbal order provided.  Carrie Salazar also advised the Pt had a fall at home on 04/28/18. She did not contact EMS or PCP. Does state she fell face first into her wall, causing a left black eye and a large bruise on her forehead. Denies loss of consciousness.   Attempted to contact Pt to get her in for office evaluation today. No answer. Left VM for her to return clinic call. Also advised Carrie Salazar if she spoke with the Pt to have her contact the office.

## 2018-05-01 NOTE — Telephone Encounter (Signed)
Noted, thanks!

## 2018-05-02 NOTE — Telephone Encounter (Signed)
This patient has an appointment for tomorrow.  Thanks.

## 2018-05-02 NOTE — Telephone Encounter (Signed)
Left brief Vm that patient was due for a follow up and to schedule with Dr. Sheppard Coil for a hospital follow up.

## 2018-05-02 NOTE — Telephone Encounter (Signed)
Noted. Thanks.

## 2018-05-03 ENCOUNTER — Ambulatory Visit (INDEPENDENT_AMBULATORY_CARE_PROVIDER_SITE_OTHER): Payer: Medicare Other | Admitting: Osteopathic Medicine

## 2018-05-03 ENCOUNTER — Encounter: Payer: Self-pay | Admitting: Osteopathic Medicine

## 2018-05-03 VITALS — BP 103/68 | HR 76 | Temp 98.5°F | Wt 200.8 lb

## 2018-05-03 DIAGNOSIS — H538 Other visual disturbances: Secondary | ICD-10-CM

## 2018-05-03 NOTE — Progress Notes (Signed)
HPI: Carrie Salazar is a 73 y.o. female who  has a past medical history of Chronic fatigue, Depression, Fatigue (08/16/2016), Fibromyalgia, Heart attack ([place]), CABG, Hyperlipidemia, Hypertension, and Migraine.  she presents to [facility] today, 05/03/18,  for chief complaint of: Falls, hospital follow-up   HFU: admitted 03/27/18-03/30/18 for E coli bacteremia d/t cystitis/pyelonephritis, d/c home on omnicef x10 days AKI improved on discharge  Recurrent falls, pt refusing PT despite extensive education PPI held d/t concern for increased risk C-diff  Home health had some concern for multiple falls w/ head trauma 04/28/18 resulting in L black eye and L forehead bruise, denied LOC. Also concern for vision deficit in L field. Referral has already been placed for ophtho on 04/02/2018 and pt was scheduled for 04/03/2018. Today she reports "didn't feel compatible w/ that provider" would rather see her regular ophtho in [place].   Reports she is falling because she can't see anything very well, has been walking into walls and doors. Lost her glasses. Daughter accompanies her to the visit. No other concerns for behavioral changes, vertigo/dizziness/imbalance, speech problems.        Past medical, surgical, social and family history reviewed:  Patient Active Problem List   Diagnosis Date Noted  . Multiple falls 03/09/2018  . Hot flashes 10/20/2017  . Presbyopia 08/08/2017  . Puckering of macula, right eye 08/08/2017  . Retinal hemorrhage, right eye 08/08/2017  . Rectal prolapse 07/25/2017  . Palpitations 02/01/2017  . Folate deficiency 02/01/2017  . History of anemia 02/01/2017  . B12 deficiency 02/01/2017  . Migraine   . Hypertension   . Hyperlipidemia   . Hx of CABG   . Heart attack ([place])   . Fibromyalgia   . Depression with anxiety   . Chronic fatigue   . Trochanteric bursitis, left hip 11/30/2016  . Degenerative arthritis of thumb, right  11/30/2016  . Fatigue 08/16/2016  . Vitamin D deficiency 08/09/2016  . Neuropathy 05/24/2016  . Elevated serum creatinine 03/31/2016  . Blood pressure instability 03/31/2016  . Other headache syndrome 02/25/2016  . SOB (shortness of breath) on exertion 01/25/2016  . Anemia 01/25/2016  . Vaginal lesion [id number]  . Moderate episode of recurrent major depressive disorder ([place]) 12/10/2015  . Female cystocele 10/16/2015  . Urinary urgency 10/16/2015  . History of obstructive sleep apnea 08/20/2015  . Atrial flutter ([place]) 08/20/2015  . Coronary artery disease involving coronary bypass graft of native heart with angina pectoris ([place]) 08/13/2015  . Chronic back pain 08/13/2015  . Recurrent UTI 08/13/2015  . Insomnia 08/13/2015  . Hyperhidrosis 08/13/2015    Past Surgical History:  Procedure Laterality Date  . ABDOMINAL HYSTERECTOMY    . APPENDECTOMY    . BACK SURGERY    . BACK SURGERY     Lumbar rods  . BLADDER SURGERY    . BREAST BIOPSY Left    x 2  . BREAST EXCISIONAL BIOPSY Left    x 2  . BREAST LUMPECTOMY     left breast all benign (x2)  . CARPAL TUNNEL RELEASE Bilateral   . CORONARY ARTERY BYPASS GRAFT     Triple  . LYMPECTOMY  1980  . TONSILLECTOMY    . VAGINA SURGERY    . WRIST SURGERY      Social History   Tobacco Use  . Smoking status: Former Smoker    Last attempt to quit: 2003    Years since quitting: 17.3  . Smokeless tobacco: Never Used  Substance  Use Topics  . Alcohol use: Yes    Comment: 1 q wk    Family History  Problem Relation Age of Onset  . Cancer Mother   . Hyperlipidemia Father   . Hypertension Father   . Stroke Father   . Heart attack Father   . Hyperlipidemia Maternal Grandmother   . Hypertension Maternal Grandmother   . Stroke Maternal Grandmother   . Hemophilia Maternal Grandfather      Current medication list and allergy/intolerance information reviewed:    Current Outpatient Medications  Medication Sig Dispense Refill  .  acetaZOLAMIDE (DIAMOX) 125 MG tablet TAKE 1 TABLET(125 MG) BY MOUTH THREE TIMES DAILY 90 tablet 0  . aspirin EC 81 MG tablet Take 1 tablet (81 mg total) by mouth daily.    . cetirizine (ZYRTEC) 5 MG tablet Take 1 tablet (5 mg total) by mouth daily. (Patient taking differently: Take 5 mg by mouth as needed. ) 30 tablet 0  . cholecalciferol (VITAMIN D) 1000 units tablet Take 1,000 Units by mouth daily.    Marland Kitchen escitalopram (LEXAPRO) 20 MG tablet Take 1 tablet (20 mg total) by mouth daily. 90 tablet 3  . estradiol (ESTRACE) 0.1 MG/GM vaginal cream INSERT 1/2 TO 1 APPLICATORFUL VAGINALLY 3 TIMES A WEEK 42.5 g 0  . estradiol (ESTRACE) 1 MG tablet Take 1 tablet (1 mg total) by mouth daily. 90 tablet 3  . Evolocumab with Infusor ([place]) 420 MG/3.5ML SOCT Inject 420 mg into the skin every 30 (thirty) days. 1 Cartridge 11  . fenofibrate (TRICOR) 48 MG tablet TAKE 1 TABLET(48 MG) BY MOUTH DAILY 90 tablet 1  . gabapentin (NEURONTIN) 600 MG tablet TAKE 1 TABLET BY MOUTH IN THE MORNING, 1 TABLET IN THE AFTERNOON, AND 2 TABLETS IN THE EVENING( 1200 MG TOTAL PER DAY) 120 tablet 3  . ipratropium (ATROVENT HFA) 17 MCG/ACT inhaler Inhale 2 puffs into the lungs every 4 (four) hours as needed for wheezing. 12.9 g 3  . lisinopril (PRINIVIL,ZESTRIL) 40 MG tablet Take 1 tablet (40 mg total) by mouth daily. 90 tablet 3  . metoprolol succinate (TOPROL XL) 25 MG 24 hr tablet Take 0.5 tablets (12.5 mg total) by mouth daily. 45 tablet 3  . Multiple Vitamins-Minerals (HAIR SKIN AND NAILS FORMULA PO) Take 1 tablet by mouth 2 (two) times daily.    . nitroGLYCERIN (NITROSTAT) 0.4 MG SL tablet Place 1 tablet (0.4 mg total) under the tongue every 5 (five) minutes as needed for chest pain. 25 tablet 12  . omeprazole (PRILOSEC) 20 MG capsule TAKE 1 CAPSULE(20 MG) BY MOUTH DAILY 30 capsule 0  . ondansetron (ZOFRAN-ODT) 8 MG disintegrating tablet Take 1 tablet (8 mg total) by mouth every 8 (eight) hours as needed for  nausea. 20 tablet 3  . ranolazine (RANEXA) 500 MG 12 hr tablet TAKE 1 TABLET BY MOUTH TWO  TIMES DAILY 180 tablet 3  . traMADol (ULTRAM) 50 MG tablet TAKE 2 TABLETS BY MOUTH THREE TIMES DAILY AS NEEDED FOR PAIN 180 tablet 0   No current facility-administered medications for this visit.     Allergies  Allergen Reactions  . Albuterol Palpitations and Other (See Comments)    HALLUCINATIONS Also feels "funny in the head"  . Hydromorphone Anaphylaxis    Caused patient to have heart attack and crazy in the head  . Oxycodone-Acetaminophen Itching  . Codeine Hives and Itching    itching  . Iodinated Diagnostic Agents Other (See Comments) and Hives  Itching and causes me to pass out Must be pre-treated with benadryl. syncope Itching, rash   . Lanolin Hives    "burns skin off"  . Latex Dermatitis    LEAVES REALLY BAD BURN ON SKIN   . Levaquin [Levofloxacin In D5w] Other (See Comments)    "I get real dizzy and pass out"    . Levofloxacin Other (See Comments)    Dizziness, "could not sit up" per pt  . Metrizamide Hives, Itching, Rash and Other (See Comments)    Itching and causes me to pass out  . Nickel Rash  . Penicillins Hives  . Rocephin [Ceftriaxone Sodium In Dextrose] Rash  . Tape Itching    With latex additives.      Review of Systems:  Constitutional:  No  fever, no chills, +recent illness  HEENT: No  headache, +vision change worse on L but this has been stable for about a month, no hearing change, No sore throat, No  sinus pressure  Cardiac: No  chest pain, No  pressure, No palpitations  Respiratory:  No  shortness of breath. No  Cough  Endocrine: No cold intolerance,  No heat intolerance. No polyuria/polydipsia/polyphagia   Neurologic: No  weakness, No  dizziness, No  slurred speech/focal weakness/facial droop   Exam:  BP 103/68 (BP Location: Left Arm, Patient Position: Sitting, Cuff Size: Large)   Pulse 76   Temp 98.5 F (36.9 C) (Oral)   Wt 200 lb 12.8  oz (91.1 kg)   BMI 33.41 kg/m   Constitutional: VS see above. General Appearance: alert, well-developed, well-nourished, NAD, bruising on the face on the L and under the R eye   Eyes: Normal lids and conjunctive, non-icteric sclera, EOMI, PERRL. Visual field, reports "unablet o see" finger in her L eye when it's directly in front/side in middle, R eye peripheral vision ok, reports it's dim and blurry, no doughnut-shaped, no darkness  Ears, Nose, Mouth, Throat: MMM, Normal external inspection ears/nares/mouth/lips/gums. TM normal bilaterally. Pharynx/tonsils no erythema, no exudate. Nasal mucosa normal.   Neck: No masses, trachea midline. No thyroid enlargement. No tenderness/mass appreciated. No lymphadenopathy  Respiratory: Normal respiratory effort. no wheeze, no rhonchi, no rales  Cardiovascular: S1/S2 normal, no murmur, no rub/gallop auscultated. RRR. No lower extremity edema.   Neurological: Normal balance/coordination. No tremor.   Skin: warm, dry, intact. No rash/ulcer.   Psychiatric: Normal judgment/insight. Normal mood and affect. Oriented x3.           ASSESSMENT/PLAN: The encounter diagnosis was Blurred vision.  Advised call her usual ophtho, I'll place referral for second opinion closer to home if needed. Days out from fall/head trauma without deficits, I don't think CT will be helpful. Cannot get MRI d/t spinal stimulator in place         Visit summary with medication list and pertinent instructions was printed for patient to review. All questions at time of visit were answered - patient instructed to contact office with any additional concerns or updates. ER/RTC precautions were reviewed with the patient.   Note: Total time spent 25 minutes, greater than 50% of the visit was spent face-to-face counseling and coordinating care for the above diagnoses listed in assessment/plan.   Please note: voice recognition software was used to produce this document, and  typos may escape review. Please contact Dr. Sheppard Coil for any needed clarifications.     Follow-up plan: Return if symptoms worsen or fail to improve.

## 2018-05-04 DIAGNOSIS — M797 Fibromyalgia: Secondary | ICD-10-CM | POA: Diagnosis not present

## 2018-05-04 DIAGNOSIS — N3000 Acute cystitis without hematuria: Secondary | ICD-10-CM | POA: Diagnosis not present

## 2018-05-04 DIAGNOSIS — B9620 Unspecified Escherichia coli [E. coli] as the cause of diseases classified elsewhere: Secondary | ICD-10-CM | POA: Diagnosis not present

## 2018-05-04 DIAGNOSIS — G629 Polyneuropathy, unspecified: Secondary | ICD-10-CM | POA: Diagnosis not present

## 2018-05-04 DIAGNOSIS — G8929 Other chronic pain: Secondary | ICD-10-CM | POA: Diagnosis not present

## 2018-05-04 DIAGNOSIS — I10 Essential (primary) hypertension: Secondary | ICD-10-CM | POA: Diagnosis not present

## 2018-05-04 DIAGNOSIS — R296 Repeated falls: Secondary | ICD-10-CM | POA: Diagnosis not present

## 2018-05-04 DIAGNOSIS — Z951 Presence of aortocoronary bypass graft: Secondary | ICD-10-CM | POA: Diagnosis not present

## 2018-05-04 DIAGNOSIS — I4892 Unspecified atrial flutter: Secondary | ICD-10-CM | POA: Diagnosis not present

## 2018-05-04 DIAGNOSIS — Z87440 Personal history of urinary (tract) infections: Secondary | ICD-10-CM | POA: Diagnosis not present

## 2018-05-04 DIAGNOSIS — E785 Hyperlipidemia, unspecified: Secondary | ICD-10-CM | POA: Diagnosis not present

## 2018-05-04 DIAGNOSIS — N12 Tubulo-interstitial nephritis, not specified as acute or chronic: Secondary | ICD-10-CM | POA: Diagnosis not present

## 2018-05-04 DIAGNOSIS — Z7982 Long term (current) use of aspirin: Secondary | ICD-10-CM | POA: Diagnosis not present

## 2018-05-04 DIAGNOSIS — I252 Old myocardial infarction: Secondary | ICD-10-CM | POA: Diagnosis not present

## 2018-05-04 DIAGNOSIS — R42 Dizziness and giddiness: Secondary | ICD-10-CM | POA: Diagnosis not present

## 2018-05-04 DIAGNOSIS — Z87891 Personal history of nicotine dependence: Secondary | ICD-10-CM | POA: Diagnosis not present

## 2018-05-04 DIAGNOSIS — I2510 Atherosclerotic heart disease of native coronary artery without angina pectoris: Secondary | ICD-10-CM | POA: Diagnosis not present

## 2018-05-04 DIAGNOSIS — M549 Dorsalgia, unspecified: Secondary | ICD-10-CM | POA: Diagnosis not present

## 2018-05-08 DIAGNOSIS — I252 Old myocardial infarction: Secondary | ICD-10-CM | POA: Diagnosis not present

## 2018-05-08 DIAGNOSIS — R42 Dizziness and giddiness: Secondary | ICD-10-CM | POA: Diagnosis not present

## 2018-05-08 DIAGNOSIS — B9620 Unspecified Escherichia coli [E. coli] as the cause of diseases classified elsewhere: Secondary | ICD-10-CM | POA: Diagnosis not present

## 2018-05-08 DIAGNOSIS — M549 Dorsalgia, unspecified: Secondary | ICD-10-CM | POA: Diagnosis not present

## 2018-05-08 DIAGNOSIS — Z87891 Personal history of nicotine dependence: Secondary | ICD-10-CM | POA: Diagnosis not present

## 2018-05-08 DIAGNOSIS — G629 Polyneuropathy, unspecified: Secondary | ICD-10-CM | POA: Diagnosis not present

## 2018-05-08 DIAGNOSIS — G8929 Other chronic pain: Secondary | ICD-10-CM | POA: Diagnosis not present

## 2018-05-08 DIAGNOSIS — E785 Hyperlipidemia, unspecified: Secondary | ICD-10-CM | POA: Diagnosis not present

## 2018-05-08 DIAGNOSIS — R296 Repeated falls: Secondary | ICD-10-CM | POA: Diagnosis not present

## 2018-05-08 DIAGNOSIS — M797 Fibromyalgia: Secondary | ICD-10-CM | POA: Diagnosis not present

## 2018-05-08 DIAGNOSIS — I10 Essential (primary) hypertension: Secondary | ICD-10-CM | POA: Diagnosis not present

## 2018-05-08 DIAGNOSIS — N12 Tubulo-interstitial nephritis, not specified as acute or chronic: Secondary | ICD-10-CM | POA: Diagnosis not present

## 2018-05-08 DIAGNOSIS — I4892 Unspecified atrial flutter: Secondary | ICD-10-CM | POA: Diagnosis not present

## 2018-05-08 DIAGNOSIS — Z951 Presence of aortocoronary bypass graft: Secondary | ICD-10-CM | POA: Diagnosis not present

## 2018-05-08 DIAGNOSIS — N3000 Acute cystitis without hematuria: Secondary | ICD-10-CM | POA: Diagnosis not present

## 2018-05-08 DIAGNOSIS — Z7982 Long term (current) use of aspirin: Secondary | ICD-10-CM | POA: Diagnosis not present

## 2018-05-08 DIAGNOSIS — Z87440 Personal history of urinary (tract) infections: Secondary | ICD-10-CM | POA: Diagnosis not present

## 2018-05-08 DIAGNOSIS — I2510 Atherosclerotic heart disease of native coronary artery without angina pectoris: Secondary | ICD-10-CM | POA: Diagnosis not present

## 2018-05-17 ENCOUNTER — Ambulatory Visit: Payer: Medicare Other | Admitting: Osteopathic Medicine

## 2018-05-19 DIAGNOSIS — A419 Sepsis, unspecified organism: Secondary | ICD-10-CM | POA: Diagnosis not present

## 2018-05-19 DIAGNOSIS — I483 Typical atrial flutter: Secondary | ICD-10-CM | POA: Diagnosis not present

## 2018-05-19 DIAGNOSIS — G43909 Migraine, unspecified, not intractable, without status migrainosus: Secondary | ICD-10-CM | POA: Diagnosis not present

## 2018-05-19 DIAGNOSIS — R404 Transient alteration of awareness: Secondary | ICD-10-CM | POA: Diagnosis not present

## 2018-05-19 DIAGNOSIS — G92 Toxic encephalopathy: Secondary | ICD-10-CM | POA: Diagnosis not present

## 2018-05-19 DIAGNOSIS — R509 Fever, unspecified: Secondary | ICD-10-CM | POA: Diagnosis not present

## 2018-05-19 DIAGNOSIS — R109 Unspecified abdominal pain: Secondary | ICD-10-CM | POA: Diagnosis not present

## 2018-05-19 DIAGNOSIS — R4020 Unspecified coma: Secondary | ICD-10-CM | POA: Diagnosis not present

## 2018-05-19 DIAGNOSIS — H53462 Homonymous bilateral field defects, left side: Secondary | ICD-10-CM | POA: Diagnosis not present

## 2018-05-19 DIAGNOSIS — Z91040 Latex allergy status: Secondary | ICD-10-CM | POA: Diagnosis not present

## 2018-05-19 DIAGNOSIS — I959 Hypotension, unspecified: Secondary | ICD-10-CM | POA: Diagnosis not present

## 2018-05-19 DIAGNOSIS — G9340 Encephalopathy, unspecified: Secondary | ICD-10-CM | POA: Diagnosis not present

## 2018-05-19 DIAGNOSIS — I2510 Atherosclerotic heart disease of native coronary artery without angina pectoris: Secondary | ICD-10-CM | POA: Diagnosis not present

## 2018-05-19 DIAGNOSIS — E860 Dehydration: Secondary | ICD-10-CM | POA: Diagnosis not present

## 2018-05-19 DIAGNOSIS — R0989 Other specified symptoms and signs involving the circulatory and respiratory systems: Secondary | ICD-10-CM | POA: Diagnosis not present

## 2018-05-19 DIAGNOSIS — N200 Calculus of kidney: Secondary | ICD-10-CM | POA: Diagnosis not present

## 2018-05-19 DIAGNOSIS — Z1159 Encounter for screening for other viral diseases: Secondary | ICD-10-CM | POA: Diagnosis not present

## 2018-05-19 DIAGNOSIS — I63233 Cerebral infarction due to unspecified occlusion or stenosis of bilateral carotid arteries: Secondary | ICD-10-CM | POA: Diagnosis not present

## 2018-05-19 DIAGNOSIS — I519 Heart disease, unspecified: Secondary | ICD-10-CM | POA: Diagnosis not present

## 2018-05-19 DIAGNOSIS — Z9181 History of falling: Secondary | ICD-10-CM | POA: Diagnosis not present

## 2018-05-19 DIAGNOSIS — I6389 Other cerebral infarction: Secondary | ICD-10-CM | POA: Diagnosis not present

## 2018-05-19 DIAGNOSIS — I69398 Other sequelae of cerebral infarction: Secondary | ICD-10-CM | POA: Diagnosis not present

## 2018-05-19 DIAGNOSIS — I249 Acute ischemic heart disease, unspecified: Secondary | ICD-10-CM | POA: Diagnosis not present

## 2018-05-19 DIAGNOSIS — R9431 Abnormal electrocardiogram [ECG] [EKG]: Secondary | ICD-10-CM | POA: Diagnosis not present

## 2018-05-19 DIAGNOSIS — I63541 Cerebral infarction due to unspecified occlusion or stenosis of right cerebellar artery: Secondary | ICD-10-CM | POA: Diagnosis not present

## 2018-05-19 DIAGNOSIS — Z888 Allergy status to other drugs, medicaments and biological substances status: Secondary | ICD-10-CM | POA: Diagnosis not present

## 2018-05-19 DIAGNOSIS — M797 Fibromyalgia: Secondary | ICD-10-CM | POA: Diagnosis not present

## 2018-05-19 DIAGNOSIS — E872 Acidosis: Secondary | ICD-10-CM | POA: Diagnosis not present

## 2018-05-19 DIAGNOSIS — Z87891 Personal history of nicotine dependence: Secondary | ICD-10-CM | POA: Diagnosis not present

## 2018-05-19 DIAGNOSIS — S0990XA Unspecified injury of head, initial encounter: Secondary | ICD-10-CM | POA: Diagnosis not present

## 2018-05-19 DIAGNOSIS — I340 Nonrheumatic mitral (valve) insufficiency: Secondary | ICD-10-CM | POA: Diagnosis not present

## 2018-05-19 DIAGNOSIS — Z79899 Other long term (current) drug therapy: Secondary | ICD-10-CM | POA: Diagnosis not present

## 2018-05-19 DIAGNOSIS — R7989 Other specified abnormal findings of blood chemistry: Secondary | ICD-10-CM | POA: Diagnosis not present

## 2018-05-19 DIAGNOSIS — D649 Anemia, unspecified: Secondary | ICD-10-CM | POA: Diagnosis not present

## 2018-05-19 DIAGNOSIS — Z7982 Long term (current) use of aspirin: Secondary | ICD-10-CM | POA: Diagnosis not present

## 2018-05-19 DIAGNOSIS — I071 Rheumatic tricuspid insufficiency: Secondary | ICD-10-CM | POA: Diagnosis not present

## 2018-05-19 DIAGNOSIS — R0902 Hypoxemia: Secondary | ICD-10-CM | POA: Diagnosis not present

## 2018-05-19 DIAGNOSIS — I63431 Cerebral infarction due to embolism of right posterior cerebral artery: Secondary | ICD-10-CM | POA: Diagnosis not present

## 2018-05-19 DIAGNOSIS — I517 Cardiomegaly: Secondary | ICD-10-CM | POA: Diagnosis not present

## 2018-05-19 DIAGNOSIS — I270 Primary pulmonary hypertension: Secondary | ICD-10-CM | POA: Diagnosis not present

## 2018-05-19 DIAGNOSIS — Z880 Allergy status to penicillin: Secondary | ICD-10-CM | POA: Diagnosis not present

## 2018-05-19 DIAGNOSIS — I63113 Cerebral infarction due to embolism of bilateral vertebral arteries: Secondary | ICD-10-CM | POA: Diagnosis not present

## 2018-05-19 DIAGNOSIS — N179 Acute kidney failure, unspecified: Secondary | ICD-10-CM | POA: Diagnosis not present

## 2018-05-19 DIAGNOSIS — R0602 Shortness of breath: Secondary | ICD-10-CM | POA: Diagnosis not present

## 2018-05-19 DIAGNOSIS — A0472 Enterocolitis due to Clostridium difficile, not specified as recurrent: Secondary | ICD-10-CM | POA: Diagnosis not present

## 2018-05-19 DIAGNOSIS — J9602 Acute respiratory failure with hypercapnia: Secondary | ICD-10-CM | POA: Diagnosis not present

## 2018-05-19 DIAGNOSIS — R2243 Localized swelling, mass and lump, lower limb, bilateral: Secondary | ICD-10-CM | POA: Diagnosis not present

## 2018-05-19 DIAGNOSIS — G629 Polyneuropathy, unspecified: Secondary | ICD-10-CM | POA: Diagnosis not present

## 2018-05-19 DIAGNOSIS — R079 Chest pain, unspecified: Secondary | ICD-10-CM | POA: Diagnosis not present

## 2018-05-19 DIAGNOSIS — E559 Vitamin D deficiency, unspecified: Secondary | ICD-10-CM | POA: Diagnosis not present

## 2018-05-19 DIAGNOSIS — R55 Syncope and collapse: Secondary | ICD-10-CM | POA: Diagnosis not present

## 2018-05-19 DIAGNOSIS — I252 Old myocardial infarction: Secondary | ICD-10-CM | POA: Diagnosis not present

## 2018-05-19 DIAGNOSIS — Z951 Presence of aortocoronary bypass graft: Secondary | ICD-10-CM | POA: Diagnosis not present

## 2018-05-19 DIAGNOSIS — E785 Hyperlipidemia, unspecified: Secondary | ICD-10-CM | POA: Diagnosis not present

## 2018-05-19 DIAGNOSIS — J9601 Acute respiratory failure with hypoxia: Secondary | ICD-10-CM | POA: Diagnosis not present

## 2018-05-19 DIAGNOSIS — I639 Cerebral infarction, unspecified: Secondary | ICD-10-CM | POA: Diagnosis not present

## 2018-05-19 DIAGNOSIS — I4892 Unspecified atrial flutter: Secondary | ICD-10-CM | POA: Diagnosis not present

## 2018-05-19 DIAGNOSIS — N390 Urinary tract infection, site not specified: Secondary | ICD-10-CM | POA: Diagnosis not present

## 2018-05-19 DIAGNOSIS — I10 Essential (primary) hypertension: Secondary | ICD-10-CM | POA: Diagnosis not present

## 2018-05-19 MED ORDER — Medication
Status: DC
Start: ? — End: 2018-05-19

## 2018-05-19 MED ORDER — BARO-CAT PO
10.00 | ORAL | Status: DC
Start: ? — End: 2018-05-19

## 2018-05-21 ENCOUNTER — Ambulatory Visit: Payer: Medicare Other | Admitting: Osteopathic Medicine

## 2018-05-21 MED ORDER — TRANQUILITY SLIMLINE BRIEF YTH MISC
325.00 | Status: DC
Start: 2018-05-21 — End: 2018-05-21

## 2018-05-21 MED ORDER — GLYCERIN 50 % PO SOLN
ORAL | Status: DC
Start: 2018-05-21 — End: 2018-05-21

## 2018-05-21 MED ORDER — Medication
Status: DC
Start: ? — End: 2018-05-21

## 2018-05-21 MED ORDER — BARO-CAT PO
10.00 | ORAL | Status: DC
Start: ? — End: 2018-05-21

## 2018-05-21 MED ORDER — TRAMADOL HCL 50 MG PO TABS
50.00 | ORAL_TABLET | ORAL | Status: DC
Start: ? — End: 2018-05-21

## 2018-05-21 MED ORDER — PHENYLEPHRINE-GUAIFENESIN 20-375 MG PO CP12
10.00 | ORAL_CAPSULE | ORAL | Status: DC
Start: ? — End: 2018-05-21

## 2018-05-21 MED ORDER — Medication
25.00 | Status: DC
Start: 2018-05-21 — End: 2018-05-21

## 2018-05-21 MED ORDER — QUINERVA 260 MG PO TABS
650.00 | ORAL_TABLET | ORAL | Status: DC
Start: ? — End: 2018-05-21

## 2018-05-21 MED ORDER — METRISET BURETTE SET MISC
125.00 | Status: DC
Start: ? — End: 2018-05-21

## 2018-05-21 MED ORDER — Medication
20.00 | Status: DC
Start: 2018-05-31 — End: 2018-05-21

## 2018-05-21 MED ORDER — QUETIAPINE FUMARATE 25 MG PO TABS
25.00 | ORAL_TABLET | ORAL | Status: DC
Start: 2018-05-30 — End: 2018-05-21

## 2018-05-21 MED ORDER — VANCOMYCIN HCL 125 MG PO CAPS
125.00 | ORAL_CAPSULE | ORAL | Status: DC
Start: 2018-05-31 — End: 2018-05-21

## 2018-05-21 MED ORDER — RANOLAZINE ER 500 MG PO TB12
500.00 | ORAL_TABLET | ORAL | Status: DC
Start: 2018-05-30 — End: 2018-05-21

## 2018-05-21 MED ORDER — BAYER WOMENS 81-300 MG PO TABS
40.00 | ORAL_TABLET | ORAL | Status: DC
Start: 2018-05-21 — End: 2018-05-21

## 2018-05-21 MED ORDER — GENERIC EXTERNAL MEDICATION
Status: DC
Start: ? — End: 2018-05-21

## 2018-05-23 ENCOUNTER — Telehealth: Payer: Self-pay

## 2018-05-23 NOTE — Telephone Encounter (Signed)
Sent to plan today 05/23/18

## 2018-05-31 ENCOUNTER — Other Ambulatory Visit: Payer: Self-pay

## 2018-05-31 MED ORDER — QUINERVA 260 MG PO TABS
650.00 | ORAL_TABLET | ORAL | Status: DC
Start: 2018-05-30 — End: 2018-05-31

## 2018-05-31 MED ORDER — ZONAS POROUS TAPE
MEDICATED_TAPE | Status: DC
Start: ? — End: 2018-05-31

## 2018-05-31 MED ORDER — PCCA BIOPEPTIDE BASE EX CREA
300.00 | TOPICAL_CREAM | CUTANEOUS | Status: DC
Start: 2018-05-30 — End: 2018-05-31

## 2018-05-31 MED ORDER — RANOLAZINE ER 500 MG PO TB12: 500.0000 mg | ORAL_TABLET | Freq: Two times a day (BID) | ORAL | 3 refills | Status: AC

## 2018-05-31 MED ORDER — ISOSORBIDE MONONITRATE ER 30 MG PO TB24
30.00 | ORAL_TABLET | ORAL | Status: DC
Start: 2018-05-31 — End: 2018-05-31

## 2018-05-31 MED ORDER — GENERIC EXTERNAL MEDICATION
Status: DC
Start: ? — End: 2018-05-31

## 2018-05-31 MED ORDER — Medication
Status: DC
Start: ? — End: 2018-05-31

## 2018-05-31 MED ORDER — COMPOUND W FREEZE OFF EX AERO
2.00 | INHALATION_SPRAY | CUTANEOUS | Status: DC
Start: 2018-05-30 — End: 2018-05-31

## 2018-05-31 MED ORDER — CAFFREE DT GEL
1.00 | DENTAL | Status: DC
Start: ? — End: 2018-05-31

## 2018-05-31 MED ORDER — FLUTICASONE PROPIONATE 50 MCG/ACT NA SUSP
1.00 | NASAL | Status: DC
Start: 2018-05-31 — End: 2018-05-31

## 2018-05-31 MED ORDER — AMLODIPINE BESYLATE 5 MG PO TABS
5.00 | ORAL_TABLET | ORAL | Status: DC
Start: 2018-05-31 — End: 2018-05-31

## 2018-05-31 MED ORDER — CALCIUM-MAGNESIUM-VITAMIN D PO
0.50 | ORAL | Status: DC
Start: 2018-05-30 — End: 2018-05-31

## 2018-05-31 MED ORDER — METOPROLOL SUCCINATE ER 100 MG PO TB24
100.00 | ORAL_TABLET | ORAL | Status: DC
Start: 2018-05-31 — End: 2018-05-31

## 2018-05-31 MED ORDER — CHLOROBUTANOL-EUGENOL & APAP
0.40 | Status: DC
Start: ? — End: 2018-05-31

## 2018-05-31 MED ORDER — CLOPIDOGREL BISULFATE 75 MG PO TABS
75.00 | ORAL_TABLET | ORAL | Status: DC
Start: 2018-05-31 — End: 2018-05-31

## 2018-05-31 MED ORDER — PRO HERBS ENERGY PO TABS
20.00 | ORAL_TABLET | ORAL | Status: DC
Start: 2018-05-30 — End: 2018-05-31

## 2018-05-31 MED ORDER — ASPIRIN 81 MG PO CHEW
81.00 | CHEWABLE_TABLET | ORAL | Status: DC
Start: 2018-05-31 — End: 2018-05-31

## 2018-05-31 MED ORDER — LISINOPRIL 20 MG PO TABS
40.00 | ORAL_TABLET | ORAL | Status: DC
Start: 2018-05-31 — End: 2018-05-31

## 2018-06-06 DIAGNOSIS — I4892 Unspecified atrial flutter: Secondary | ICD-10-CM | POA: Diagnosis not present

## 2018-06-06 DIAGNOSIS — I10 Essential (primary) hypertension: Secondary | ICD-10-CM | POA: Diagnosis not present

## 2018-06-06 DIAGNOSIS — I639 Cerebral infarction, unspecified: Secondary | ICD-10-CM | POA: Diagnosis not present

## 2018-06-07 ENCOUNTER — Other Ambulatory Visit: Payer: Self-pay | Admitting: Osteopathic Medicine

## 2018-06-08 ENCOUNTER — Other Ambulatory Visit: Payer: Self-pay | Admitting: Osteopathic Medicine

## 2018-06-18 ENCOUNTER — Telehealth: Payer: Self-pay | Admitting: Cardiology

## 2018-06-18 DIAGNOSIS — H35371 Puckering of macula, right eye: Secondary | ICD-10-CM | POA: Diagnosis not present

## 2018-06-18 DIAGNOSIS — D3131 Benign neoplasm of right choroid: Secondary | ICD-10-CM | POA: Diagnosis not present

## 2018-06-18 DIAGNOSIS — H43812 Vitreous degeneration, left eye: Secondary | ICD-10-CM | POA: Diagnosis not present

## 2018-06-18 DIAGNOSIS — H35372 Puckering of macula, left eye: Secondary | ICD-10-CM | POA: Diagnosis not present

## 2018-06-18 DIAGNOSIS — I639 Cerebral infarction, unspecified: Secondary | ICD-10-CM | POA: Diagnosis not present

## 2018-06-18 NOTE — Telephone Encounter (Signed)
Video visit/call [phone]/HT has had stroke/my chart/pre reg complete/consent obtained -- ttf

## 2018-06-19 ENCOUNTER — Telehealth: Payer: Self-pay

## 2018-06-19 ENCOUNTER — Other Ambulatory Visit: Payer: Self-pay

## 2018-06-19 MED ORDER — METOPROLOL SUCCINATE ER 100 MG PO TB24: 100.0000 mg | ORAL_TABLET | Freq: Every day | ORAL | 1 refills | Status: AC

## 2018-06-19 MED ORDER — ISOSORBIDE MONONITRATE ER 30 MG PO TB24: 30.0000 mg | ORAL_TABLET | Freq: Every day | ORAL | 0 refills | Status: AC

## 2018-06-19 MED ORDER — CLOPIDOGREL BISULFATE 75 MG PO TABS: 75.0000 mg | ORAL_TABLET | Freq: Every day | ORAL | 1 refills | Status: AC

## 2018-06-19 MED ORDER — ATORVASTATIN CALCIUM 20 MG PO TABS: 20.0000 mg | ORAL_TABLET | Freq: Every day | ORAL | 1 refills | Status: AC

## 2018-06-19 MED ORDER — AMLODIPINE BESYLATE 5 MG PO TABS: 5.0000 mg | ORAL_TABLET | Freq: Every day | ORAL | 1 refills | Status: AC

## 2018-06-19 NOTE — Telephone Encounter (Signed)
Called patient to verify medication she needed refilled

## 2018-06-19 NOTE — Telephone Encounter (Signed)

## 2018-06-20 ENCOUNTER — Ambulatory Visit (INDEPENDENT_AMBULATORY_CARE_PROVIDER_SITE_OTHER): Payer: Medicare Other | Admitting: Osteopathic Medicine

## 2018-06-20 ENCOUNTER — Encounter: Payer: Self-pay | Admitting: Osteopathic Medicine

## 2018-06-20 VITALS — BP 121/50 | HR 66 | Temp 98.7°F | Wt 199.2 lb

## 2018-06-20 DIAGNOSIS — I69993 Ataxia following unspecified cerebrovascular disease: Secondary | ICD-10-CM

## 2018-06-20 DIAGNOSIS — I69398 Other sequelae of cerebral infarction: Secondary | ICD-10-CM | POA: Diagnosis not present

## 2018-06-20 DIAGNOSIS — H539 Unspecified visual disturbance: Secondary | ICD-10-CM | POA: Diagnosis not present

## 2018-06-20 DIAGNOSIS — R296 Repeated falls: Secondary | ICD-10-CM | POA: Diagnosis not present

## 2018-06-20 DIAGNOSIS — G4701 Insomnia due to medical condition: Secondary | ICD-10-CM

## 2018-06-20 DIAGNOSIS — G9340 Encephalopathy, unspecified: Secondary | ICD-10-CM

## 2018-06-20 DIAGNOSIS — G5793 Unspecified mononeuropathy of bilateral lower limbs: Secondary | ICD-10-CM

## 2018-06-20 DIAGNOSIS — D649 Anemia, unspecified: Secondary | ICD-10-CM

## 2018-06-20 MED ORDER — CETIRIZINE HCL 5 MG PO TABS: 5.0000 mg | ORAL_TABLET | Freq: Every day | ORAL | 3 refills | Status: AC

## 2018-06-20 MED ORDER — ACETAZOLAMIDE 125 MG PO TABS: 125.0000 mg | ORAL_TABLET | Freq: Every day | ORAL | 1 refills | Status: AC | PRN

## 2018-06-20 MED ORDER — GABAPENTIN 600 MG PO TABS: 1200.0000 mg | ORAL_TABLET | Freq: Every day | ORAL | 1 refills | Status: DC

## 2018-06-20 MED ORDER — QUETIAPINE FUMARATE 25 MG PO TABS: 25.0000 mg | ORAL_TABLET | Freq: Every day | ORAL | 1 refills | Status: DC

## 2018-06-20 NOTE — Addendum Note (Signed)
Addended by: Maryla Morrow on: 06/20/2018 03:36 PM   Modules accepted: Orders

## 2018-06-20 NOTE — Patient Instructions (Signed)
If lightheaded (more so than unsteady or spinning sensation) AND blood pressure is less than 110 top number OR less than 50 bottom number, STOP the amlodipine and call me.

## 2018-06-20 NOTE — Progress Notes (Signed)
HPI: Carrie Salazar is a 73 y.o. female who  has a past medical history of Chronic fatigue, Depression, Fatigue (08/16/2016), Fibromyalgia, Heart attack ([place]), CABG, Hyperlipidemia, Hypertension, and Migraine.  she presents to [facility] today, 06/20/18,  for chief complaint of: Hospital follow-up  Admitted 05/19/2018 and length of stay was 12 days, discharged 05/30/2018.   Admitted for   encephalopathy likely d/t psychoactive medications / gabapentin  Anemia Hgb on discharge 9.6  Cdiff w/ associated AKI  Subacute R occipital CVA started on aspirin/Plavix dual anti-platelet therapy  chest pain w/ neg stress test  hypoxia/ARF on exertion requiring O2.  Follow-up recommendations included consider PFTs/pulmonary referral.  Patient reports overall doing well since discharge from hospital.  Still has persistent visual field deficits, sees dark L-shaped, faculty seeing things on the ground right in front of her, still feeling unsteady on her feet, has not participated in physical therapy since discharge from the hospital.  Would like to get this started back up again.  No new weakness, no speech changes, no concern for fusion, husband notes normal behavior.   Patient is accompanied by husband who assists with history-taking.      Past medical, surgical, social and family history reviewed:  Patient Active Problem List   Diagnosis Date Noted  . Multiple falls 03/09/2018  . Hot flashes 10/20/2017  . Presbyopia 08/08/2017  . Puckering of macula, right eye 08/08/2017  . Retinal hemorrhage, right eye 08/08/2017  . Rectal prolapse 07/25/2017  . Palpitations 02/01/2017  . Folate deficiency 02/01/2017  . History of anemia 02/01/2017  . B12 deficiency 02/01/2017  . Migraine   . Hypertension   . Hyperlipidemia   . Hx of CABG   . Heart attack ([place])   . Fibromyalgia   . Depression with anxiety   . Chronic fatigue   . Trochanteric bursitis, left hip  11/30/2016  . Degenerative arthritis of thumb, right 11/30/2016  . Fatigue 08/16/2016  . Vitamin D deficiency 08/09/2016  . Neuropathy 05/24/2016  . Elevated serum creatinine 03/31/2016  . Blood pressure instability 03/31/2016  . Other headache syndrome 02/25/2016  . SOB (shortness of breath) on exertion 01/25/2016  . Anemia 01/25/2016  . Vaginal lesion 2020/04/215  . Moderate episode of recurrent major depressive disorder ([place]) 12/10/2015  . Female cystocele 10/16/2015  . Urinary urgency 10/16/2015  . History of obstructive sleep apnea 08/20/2015  . Atrial flutter ([place]) 08/20/2015  . Coronary artery disease involving coronary bypass graft of native heart with angina pectoris ([place]) 08/13/2015  . Chronic back pain 08/13/2015  . Recurrent UTI 08/13/2015  . Insomnia 08/13/2015  . Hyperhidrosis 08/13/2015    Past Surgical History:  Procedure Laterality Date  . ABDOMINAL HYSTERECTOMY    . APPENDECTOMY    . BACK SURGERY    . BACK SURGERY     Lumbar rods  . BLADDER SURGERY    . BREAST BIOPSY Left    x 2  . BREAST EXCISIONAL BIOPSY Left    x 2  . BREAST LUMPECTOMY     left breast all benign (x2)  . CARPAL TUNNEL RELEASE Bilateral   . CORONARY ARTERY BYPASS GRAFT     Triple  . LYMPECTOMY  1980  . TONSILLECTOMY    . VAGINA SURGERY    . WRIST SURGERY      Social History   Tobacco Use  . Smoking status: Former Smoker    Last attempt to quit: 2003    Years since quitting: 17.4  .  Smokeless tobacco: Never Used  Substance Use Topics  . Alcohol use: Yes    Comment: 1 q wk    Family History  Problem Relation Age of Onset  . Cancer Mother   . Hyperlipidemia Father   . Hypertension Father   . Stroke Father   . Heart attack Father   . Hyperlipidemia Maternal Grandmother   . Hypertension Maternal Grandmother   . Stroke Maternal Grandmother   . Hemophilia Maternal Grandfather      Current medication list and allergy/intolerance information reviewed:    Medication  reconciliation performed personally, I printed out the list from patient's hospitalization, compared to our list on file, patient also noted a few additions that we did not have documented.  See list printed on AVS.   Allergies  Allergen Reactions  . Albuterol Palpitations and Other (See Comments)    HALLUCINATIONS Also feels "funny in the head"  . Hydromorphone Anaphylaxis    Caused patient to have heart attack and crazy in the head  . Oxycodone-Acetaminophen Itching  . Codeine Hives and Itching    itching  . Iodinated Diagnostic Agents Other (See Comments) and Hives    Itching and causes me to pass out Must be pre-treated with benadryl. syncope Itching, rash   . Lanolin Hives    "burns skin off"  . Latex Dermatitis    LEAVES REALLY BAD BURN ON SKIN   . Levaquin [Levofloxacin In D5w] Other (See Comments)    "I get real dizzy and pass out"    . Levofloxacin Other (See Comments)    Dizziness, "could not sit up" per pt  . Metrizamide Hives, Itching, Rash and Other (See Comments)    Itching and causes me to pass out  . Nickel Rash  . Penicillins Hives  . Rocephin [Ceftriaxone Sodium In Dextrose] Rash  . Tape Itching    With latex additives.      Review of Systems:  Constitutional:  No  fever, no chills  HEENT: No  headache, no vision change lately but persistent visual defcit, no hearing change, No sore throat, No  sinus pressure  Cardiac: No  chest pain, No  pressure, No palpitations (improved on increased metoprolol), No  Orthopnea  Respiratory:  No  shortness of breath. No  Cough  Gastrointestinal: No  abdominal pain, No  nausea, No  vomiting,  No  blood in stool, No  diarrhea, No  constipation   Musculoskeletal: No new myalgia/arthralgia  Skin: No  Rash  Hem/Onc: No  easy bruising/bleeding  Neurologic: +generalized weakness, +dizziness, No  slurred speech/focal weakness/facial droop  Psychiatric: No  concerns with depression, No  concerns with anxiety, No  sleep problems, No mood problems  Exam:  BP (!) 121/50 (BP Location: Left Arm, Patient Position: Sitting, Cuff Size: Normal)   Pulse 66   Temp 98.7 F (37.1 C) (Oral)   Wt 199 lb 3.2 oz (90.4 kg)   BMI 33.15 kg/m   Constitutional: VS see above. General Appearance: alert, well-developed, well-nourished, NAD  Eyes: Normal lids and conjunctive, non-icteric sclera  Neck: No masses, trachea midline  Respiratory: Normal respiratory effort. no wheeze, no rhonchi, no rales  Cardiovascular: S1/S2 normal, no murmur, no rub/gallop auscultated. RRR. No lower extremity edema.    Neurological: Normal balance/coordination. No tremor. No cranial nerve deficit on limited exam. Motor and sensation intact and symmetric  Skin: warm, dry, intact. No rash/ulcer.   Psychiatric: Normal judgment/insight. Normal mood and affect. Oriented x3.  ASSESSMENT/PLAN: The primary encounter diagnosis was CVA, old, disturbances of vision. Diagnoses of Neuropathic pain, leg, bilateral, Encephalopathy, CVA, old, ataxia, Multiple falls, Anemia, unspecified type, and Insomnia due to medical condition were also pertinent to this visit.   Orders Placed This Encounter  Procedures  . CBC  . COMPLETE METABOLIC PANEL WITH GFR    Patient Instructions  If lightheaded (more so than unsteady or spinning sensation) AND blood pressure is less than 110 top number OR less than 50 bottom number, STOP the amlodipine and call me.      Current Outpatient Medications:  .  acetaZOLAMIDE (DIAMOX) 125 MG tablet, Take 1 tablet (125 mg total) by mouth daily as needed (migraine)., Disp: 30 tablet, Rfl: 1 .  amLODipine (NORVASC) 5 MG tablet, Take 1 tablet (5 mg total) by mouth daily., Disp: 90 tablet, Rfl: 1 .  atorvastatin (LIPITOR) 20 MG tablet, Take 1 tablet (20 mg total) by mouth daily., Disp: 90 tablet, Rfl: 1 .  cholecalciferol (VITAMIN D) 1000 units tablet, Take 1,000 Units by mouth daily., Disp: , Rfl:  .  clopidogrel  (PLAVIX) 75 MG tablet, Take 1 tablet (75 mg total) by mouth daily., Disp: 90 tablet, Rfl: 1 .  escitalopram (LEXAPRO) 20 MG tablet, Take 1 tablet (20 mg total) by mouth daily., Disp: 90 tablet, Rfl: 3 .  fluticasone (FLONASE) 50 MCG/ACT nasal spray, Place 1 spray into both nostrils daily., Disp: , Rfl:  .  gabapentin (NEURONTIN) 600 MG tablet, Take 2 tablets (1,200 mg total) by mouth at bedtime., Disp: 180 tablet, Rfl: 1 .  ipratropium (ATROVENT HFA) 17 MCG/ACT inhaler, Inhale 2 puffs into the lungs every 4 (four) hours as needed for wheezing., Disp: 12.9 g, Rfl: 3 .  isosorbide mononitrate (IMDUR) 30 MG 24 hr tablet, Take 1 tablet (30 mg total) by mouth daily., Disp: 90 tablet, Rfl: 0 .  lisinopril (PRINIVIL,ZESTRIL) 40 MG tablet, Take 1 tablet (40 mg total) by mouth daily., Disp: 90 tablet, Rfl: 3 .  metoprolol succinate (TOPROL-XL) 100 MG 24 hr tablet, Take 1 tablet (100 mg total) by mouth daily. Take with or immediately following a meal., Disp: 90 tablet, Rfl: 1 .  Multiple Vitamins-Minerals (HAIR SKIN AND NAILS FORMULA PO), Take 1 tablet by mouth 2 (two) times daily., Disp: , Rfl:  .  nitroGLYCERIN (NITROSTAT) 0.4 MG SL tablet, Place 1 tablet (0.4 mg total) under the tongue every 5 (five) minutes as needed for chest pain., Disp: 25 tablet, Rfl: 12 .  pantoprazole (PROTONIX) 40 MG tablet, Take 40 mg by mouth daily., Disp: , Rfl:  .  QUEtiapine (SEROQUEL) 25 MG tablet, Take 1 tablet (25 mg total) by mouth at bedtime., Disp: 90 tablet, Rfl: 1 .  ranolazine (RANEXA) 500 MG 12 hr tablet, Take 1 tablet (500 mg total) by mouth 2 (two) times daily., Disp: 180 tablet, Rfl: 3 .  saccharomyces boulardii (FLORASTOR) 250 MG capsule, Take 250 mg by mouth 2 (two) times daily., Disp: , Rfl:  .  traMADol (ULTRAM) 50 MG tablet, TAKE 2 TABLETS BY MOUTH THREE TIMES DAILY AS NEEDED FOR PAIN, Disp: 180 tablet, Rfl: 0 .  aspirin EC 81 MG tablet, Take 1 tablet (81 mg total) by mouth daily. (Patient not taking: Reported  on 06/20/2018), Disp: , Rfl:  .  cetirizine (ZYRTEC) 5 MG tablet, Take 1 tablet (5 mg total) by mouth daily., Disp: 90 tablet, Rfl: 3   .    Visit summary with medication list and pertinent instructions was printed  for patient to review. All questions at time of visit were answered - patient instructed to contact office with any additional concerns or updates. ER/RTC precautions were reviewed with the patient.   Note: Total time spent 40 minutes, greater than 50% of the visit was spent face-to-face counseling and coordinating care for the above diagnoses listed in assessment/plan.   Please note: voice recognition software was used to produce this document, and typos may escape review. Please contact Dr. Sheppard Coil for any needed clarifications.     Follow-up plan: Return in about 3 months (around 09/20/2018) for recheck chronic issues, see me sooner if needed .

## 2018-06-21 LAB — CBC
HCT: 34.3 % — ABNORMAL LOW (ref 35.0–45.0)
Hemoglobin: 11.3 g/dL — ABNORMAL LOW (ref 11.7–15.5)
MCH: 30.3 pg (ref 27.0–33.0)
MCHC: 32.9 g/dL (ref 32.0–36.0)
MCV: 92 fL (ref 80.0–100.0)
MPV: 9.2 fL (ref 7.5–12.5)
Platelets: 215 10*3/uL (ref 140–400)
RBC: 3.73 10*6/uL — ABNORMAL LOW (ref 3.80–5.10)
RDW: 14.3 % (ref 11.0–15.0)
WBC: 5.7 10*3/uL (ref 3.8–10.8)

## 2018-06-21 LAB — COMPLETE METABOLIC PANEL WITH GFR
AG Ratio: 1.6 (calc) (ref 1.0–2.5)
ALT: 12 U/L (ref 6–29)
AST: 15 U/L (ref 10–35)
Albumin: 4.1 g/dL (ref 3.6–5.1)
Alkaline phosphatase (APISO): 49 U/L (ref 37–153)
BUN/Creatinine Ratio: 16 (calc) (ref 6–22)
BUN: 16 mg/dL (ref 7–25)
CO2: 28 mmol/L (ref 20–32)
Calcium: 9.5 mg/dL (ref 8.6–10.4)
Chloride: 104 mmol/L (ref 98–110)
Creat: 1 mg/dL — ABNORMAL HIGH (ref 0.60–0.93)
GFR, Est African American: 64 mL/min/{1.73_m2} (ref >=60)
GFR, Est Non African American: 55 mL/min/{1.73_m2} — ABNORMAL LOW (ref >=60)
Globulin: 2.5 g/dL (calc) (ref 1.9–3.7)
Glucose, Bld: 104 mg/dL — ABNORMAL HIGH (ref 65–99)
Potassium: 4.3 mmol/L (ref 3.5–5.3)
Sodium: 140 mmol/L (ref 135–146)
Total Bilirubin: 0.3 mg/dL (ref 0.2–1.2)
Total Protein: 6.6 g/dL (ref 6.1–8.1)

## 2018-06-25 NOTE — Progress Notes (Signed)
Cardiology Office Note:    Date:  06/26/2018   ID:  Carrie Salazar, DOB [birth year], MRN [id number]  PCP:  [name], DO  Cardiologist:  Kirk Ruths, MD   Referring MD: [name], DO   Chief Complaint  Patient presents with  . Follow-up    stroke    History of Present Illness:    Carrie Salazar is a 73 y.o. female with a hx of CAD s/p CABG 2013 with subsequent PCI of PDA and PLA, heart cath 04/2014 with patent LIMA-LAD, patent SVG-RI, patent SVG-PDA, with patent stents in the PDA. She is intolerant to statins. Echocardiogram 11/19 with normal LV function. Holter monitor 10/2017 with sinus rhythm, PACs, PVCs, and three beats of NSVT. Her BB was continued. She also had a negative myoview 11/2017. She was last seen in clinic with [name] on 12/29/17. She was doing well at that time. She reported occasional brief palpitations, but no syncope. Since that visit, she has had mulitple falls due to vision changes.   Of note, a holter monitor in 2017 with suspected atrial flutter (in [place]). She was initially placed on eliquis 5 mg BID. [name] obtains strips from [place] for review and felt her rhythm was more consistent with paroxysmal atrial tachycardia and anticoagulation was discontinued.  She is seen today in follow up after a stroke. She suffered a subacute right occipital stroke at [facility] ED on 05/19/18. She presented with altered mental status and diarrhea, Cdiff positive.  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 Telemetry during her hospitalization did not show atrial fibrillation or atrial flutter. She was treated for UTI during her hospitalization. Blood pressure was labile - ACEI was reduced for AKI, renal function normalized and she resumed 40 mg lisinopril. Clonidine patch 0.3 and imdur 15 mg were added to her regimen. She was hypoxic requiring O2. She declined SNF and inpatient rehab.   She followed up with neurology on 06/06/18. Statin was  added and she is on DAPT x 90 days, then single antiplatelet therapy. She continues to have vision impairments as a result of her occipital stroke.   D/C summary reviewed: toprol 100 mg Lisinopril 40 mg ranexa 500 mg BID Amlodipine 5 mg plavix 75 mg ASA 81 mg imdur 30 mg  She is seen today for follow up. She denies recent chest pain but does report occasional twinges. She has infrequent palpitations. Palpitations have been largely controlled with the toprol. She states she does not feel heart racing anymore when she tries to go to bed.     Past Medical History:  Diagnosis Date  . Chronic fatigue   . Depression   . Fatigue 08/16/2016  . Fibromyalgia   . Heart attack ([place])   . Hx of CABG   . Hyperlipidemia   . Hypertension   . Migraine     Past Surgical History:  Procedure Laterality Date  . ABDOMINAL HYSTERECTOMY    . APPENDECTOMY    . BACK SURGERY    . BACK SURGERY     Lumbar rods  . BLADDER SURGERY    . BREAST BIOPSY Left    x 2  . BREAST EXCISIONAL BIOPSY Left    x 2  . BREAST LUMPECTOMY     left breast all benign (x2)  . CARPAL TUNNEL RELEASE Bilateral   . CORONARY ARTERY BYPASS GRAFT     Triple  . LYMPECTOMY  1980  . TONSILLECTOMY    . VAGINA SURGERY    .  WRIST SURGERY      Current Medications: Current Meds  Medication Sig  . acetaZOLAMIDE (DIAMOX) 125 MG tablet Take 1 tablet (125 mg total) by mouth daily as needed (migraine).  Marland Kitchen amLODipine (NORVASC) 5 MG tablet Take 1 tablet (5 mg total) by mouth daily.  Marland Kitchen aspirin EC 81 MG tablet Take 1 tablet (81 mg total) by mouth daily.  Marland Kitchen atorvastatin (LIPITOR) 20 MG tablet Take 1 tablet (20 mg total) by mouth daily.  . cetirizine (ZYRTEC) 5 MG tablet Take 1 tablet (5 mg total) by mouth daily.  . cholecalciferol (VITAMIN D) 1000 units tablet Take 1,000 Units by mouth daily.  . clopidogrel (PLAVIX) 75 MG tablet Take 1 tablet (75 mg total) by mouth daily.  Marland Kitchen escitalopram (LEXAPRO) 20 MG tablet Take 1 tablet (20 mg  total) by mouth daily.  . fluticasone (FLONASE) 50 MCG/ACT nasal spray Place 1 spray into both nostrils daily.  Marland Kitchen gabapentin (NEURONTIN) 600 MG tablet Take 2 tablets (1,200 mg total) by mouth at bedtime.  Marland Kitchen ipratropium (ATROVENT HFA) 17 MCG/ACT inhaler Inhale 2 puffs into the lungs every 4 (four) hours as needed for wheezing.  . isosorbide mononitrate (IMDUR) 30 MG 24 hr tablet Take 1 tablet (30 mg total) by mouth daily.  Marland Kitchen lisinopril (PRINIVIL,ZESTRIL) 40 MG tablet Take 1 tablet (40 mg total) by mouth daily.  . metoprolol succinate (TOPROL-XL) 100 MG 24 hr tablet Take 1 tablet (100 mg total) by mouth daily. Take with or immediately following a meal.  . Multiple Vitamins-Minerals (HAIR SKIN AND NAILS FORMULA PO) Take 1 tablet by mouth 2 (two) times daily.  . nitroGLYCERIN (NITROSTAT) 0.4 MG SL tablet Place 1 tablet (0.4 mg total) under the tongue every 5 (five) minutes as needed for chest pain.  . pantoprazole (PROTONIX) 40 MG tablet Take 40 mg by mouth daily.  . QUEtiapine (SEROQUEL) 25 MG tablet Take 1 tablet (25 mg total) by mouth at bedtime.  . ranolazine (RANEXA) 500 MG 12 hr tablet Take 1 tablet (500 mg total) by mouth 2 (two) times daily.  Marland Kitchen saccharomyces boulardii (FLORASTOR) 250 MG capsule Take 250 mg by mouth 2 (two) times daily.  . traMADol (ULTRAM) 50 MG tablet TAKE 2 TABLETS BY MOUTH THREE TIMES DAILY AS NEEDED FOR PAIN     Allergies:   Albuterol, Hydromorphone, Oxycodone-acetaminophen, Codeine, Iodinated diagnostic agents, Lanolin, Latex, Levaquin [levofloxacin in d5w], Levofloxacin, Metrizamide, Nickel, Penicillins, Rocephin [ceftriaxone sodium in dextrose], and Tape   Social History   Socioeconomic History  . Marital status: Married    Spouse name: Not on file  . Number of children: 2  . Years of education: 92  . Highest education level: 12th grade  Occupational History  . Occupation: Retired    Comment: Water quality scientist  . Financial resource strain: Not very  hard  . Food insecurity    Worry: Never true    Inability: Never true  . Transportation needs    Medical: No    Non-medical: No  Tobacco Use  . Smoking status: Former Smoker    Quit date: 2003    Years since quitting: 17.4  . Smokeless tobacco: Never Used  Substance and Sexual Activity  . Alcohol use: Yes    Comment: 1 q wk  . Drug use: No  . Sexual activity: Not Currently    Partners: Male    Comment: Married  Lifestyle  . Physical activity    Days per week: 0 days    Minutes  per session: 0 min  . Stress: Only a little  Relationships  . Social connections    Talks on phone: More than three times a week    Gets together: Twice a week    Attends religious service: 1 to 4 times per year    Active member of club or organization: No    Attends meetings of clubs or organizations: Never    Relationship status: Married  Other Topics Concern  . Not on file  Social History Narrative   Lives at home w/ her husband   Right-handed   Caffeine: 2 cups per day   Going to church sometimes with daughter. Adjusting to the move and being in new place.     Family History: The patient's family history includes Cancer in her mother; Heart attack in her father; Hemophilia in her maternal grandfather; Hyperlipidemia in her father and maternal grandmother; Hypertension in her father and maternal grandmother; Stroke in her father and maternal grandmother.  ROS:   Please see the history of present illness.     All other systems reviewed and are negative.  EKGs/Labs/Other Studies Reviewed:    The following studies were reviewed today:  Myoview 11/2017: Low risk study  EKG:  EKG is ordered today.  The ekg ordered today demonstrates sinus rhythm, HR 54  Recent Labs: 03/15/2018: Magnesium 2.0; TSH 1.42 06/20/2018: ALT 12; BUN 16; Creat 1.00; Hemoglobin 11.3; Platelets 215; Potassium 4.3; Sodium 140  Recent Lipid Panel    Component Value Date/Time   CHOL 274 (H) 11/14/2017 1532   TRIG  358 (H) 11/14/2017 1532   HDL 44 11/14/2017 1532   CHOLHDL 6.2 (H) 11/14/2017 1532   CHOLHDL 5.5 (H) 07/25/2017 1437   LDLCALC 158 (H) 11/14/2017 1532   [place]  07/25/2017 1437     Comment:     . LDL cholesterol not calculated. Triglyceride levels greater than 400 mg/dL invalidate calculated LDL results. . Reference range: <100 . Desirable range <100 mg/dL for primary prevention;   <70 mg/dL for patients with CHD or diabetic patients  with > or = 2 CHD risk factors. Marland Kitchen LDL-C is now calculated using the Martin-Hopkins  calculation, which is a validated novel method providing  better accuracy than the Friedewald equation in the  estimation of LDL-C.  Cresenciano Genre et al. Annamaria Helling. 2947;654(65): 2061-2068  (http://education.[employer].com/faq/FAQ164)     Physical Exam:    VS:  BP 105/62   Pulse (!) 54   Ht 5\' 5"  (1.651 m)   Wt 201 lb 12.8 oz (91.5 kg)   BMI 33.58 kg/m     Wt Readings from Last 3 Encounters:  06/26/18 201 lb 12.8 oz (91.5 kg)  06/20/18 199 lb 3.2 oz (90.4 kg)  05/03/18 200 lb 12.8 oz (91.1 kg)     GEN:  Well nourished, well developed in no acute distress HEENT: Normal NECK: No JVD; No carotid bruits LYMPHATICS: No lymphadenopathy CARDIAC: RRR, no murmurs, rubs, gallops RESPIRATORY:  Clear to auscultation without rales, wheezing or rhonchi  ABDOMEN: Soft, non-tender, non-distended MUSCULOSKELETAL:  No edema; No deformity  SKIN: Warm and dry NEUROLOGIC:  Alert and oriented x 3 PSYCHIATRIC:  Normal affect   ASSESSMENT:    1. Cerebrovascular accident (CVA) due to occlusion of other cerebral artery ([place])   2. Coronary artery disease involving native coronary artery of native heart without angina pectoris   3. Essential hypertension   4. Pure hypercholesterolemia   5. OSA (obstructive sleep apnea)   6. 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  - she has residual vision deficits in her left eye - she walks with a cane - she has fallen about 4 times prior to stroke, none since the stroke - continue ASA and plavix per neurology Will place 30-day holter monitor for arrhythmia.    Coronary artery disease involving native coronary artery of native heart without angina pectoris Continue DAPT as above. Continue statin   Pure hypercholesterolemia -  Continue statin.  11/14/2017: Cholesterol, Total 274; HDL 44; LDL Calculated 158; Triglycerides 358 Needs repeat fasting labs at her next visit. She does not know how long she has been on the lipitor. She could not tolerate repatha (thinks this is why she had falls).  May need to go back to lipid clinic.    OSA (obstructive sleep apnea)  Untreated, she could not tolerate CPAP. We discussed how this can contribute to arrhythmias.   Palpitations Largely controlled with toprol   Hypertension Pressures have been running 134/60. She was hypertensive urgency on presentation to the ER. She is much better controlled on her current regimen. No changes.      Medication Adjustments/Labs and Tests Ordered: Current medicines are reviewed at length with the patient today.  Concerns regarding medicines are outlined above.  No orders of the defined types were placed in this encounter.  No orders of the defined types were placed in this encounter.   Signed, Ledora Bottcher, PA  06/26/2018 3:24 PM    [facility]

## 2018-06-26 ENCOUNTER — Telehealth: Payer: Medicare Other | Admitting: Cardiology

## 2018-06-26 ENCOUNTER — Encounter: Payer: Self-pay | Admitting: Physician Assistant

## 2018-06-26 ENCOUNTER — Other Ambulatory Visit: Payer: Self-pay

## 2018-06-26 ENCOUNTER — Ambulatory Visit: Payer: Medicare Other | Admitting: Physician Assistant

## 2018-06-26 VITALS — BP 105/62 | HR 54 | Ht 65.0 in | Wt 201.8 lb

## 2018-06-26 DIAGNOSIS — I6359 Cerebral infarction due to unspecified occlusion or stenosis of other cerebral artery: Secondary | ICD-10-CM

## 2018-06-26 DIAGNOSIS — I2510 Atherosclerotic heart disease of native coronary artery without angina pectoris: Secondary | ICD-10-CM

## 2018-06-26 DIAGNOSIS — I10 Essential (primary) hypertension: Secondary | ICD-10-CM

## 2018-06-26 DIAGNOSIS — E7800 Pure hypercholesterolemia, unspecified: Secondary | ICD-10-CM | POA: Diagnosis not present

## 2018-06-26 DIAGNOSIS — R002 Palpitations: Secondary | ICD-10-CM

## 2018-06-26 DIAGNOSIS — G4733 Obstructive sleep apnea (adult) (pediatric): Secondary | ICD-10-CM | POA: Diagnosis not present

## 2018-06-26 NOTE — Patient Instructions (Signed)
Medication Instructions:  Your physician recommends that you continue on your current medications as directed. Please refer to the Current Medication list given to you today.  If you need a refill on your cardiac medications before your next appointment, please call your pharmacy.   Testing/Procedures:  SCHEDULE ON SAME DAY (IF POSSIBLE):  Your physician has requested that you have a carotid duplex. This test is an ultrasound of the carotid arteries in your neck. It looks at blood flow through these arteries that supply the brain with blood. Allow one hour for this exam. There are no restrictions or special instructions.  Your physician has recommended that you wear an event monitor for 30 days. Event monitors are medical devices that record the heart's electrical activity. Doctors most often [place] these monitors to diagnose arrhythmias. Arrhythmias are problems with the speed or rhythm of the heartbeat. The monitor is a small, portable device. You can wear one while you do your normal daily activities. This is usually used to diagnose what is causing palpitations/syncope (passing out).    Follow-Up: At [facility], you and your health needs are our priority.  As part of our continuing mission to provide you with exceptional heart care, we have created designated Provider Care Teams.  These Care Teams include your primary Cardiologist (physician) and Advanced Practice Providers (APPs -  Physician Assistants and Nurse Practitioners) who all work together to provide you with the care you need, when you need it. You will need a follow up appointment in 6 weeks AFTER MONITOR HAS BEEN PLACED.  Please call our office 2 months in advance to schedule this appointment.  You may see Kirk Ruths, MD or one of the following Advanced Practice Providers on your designated Care Team:   Kerin Ransom, PA-C Roby Lofts, [place] . Sande Rives, PA-C  Any Other Special Instructions Will Be Listed Below (If  Applicable). You have been referred to the [facility] in our office with Dr. Debara Pickett.

## 2018-06-27 ENCOUNTER — Telehealth: Payer: Self-pay

## 2018-06-27 ENCOUNTER — Telehealth: Payer: Self-pay | Admitting: *Deleted

## 2018-06-27 DIAGNOSIS — I672 Cerebral atherosclerosis: Secondary | ICD-10-CM | POA: Diagnosis not present

## 2018-06-27 DIAGNOSIS — R590 Localized enlarged lymph nodes: Secondary | ICD-10-CM | POA: Diagnosis not present

## 2018-06-27 DIAGNOSIS — R911 Solitary pulmonary nodule: Secondary | ICD-10-CM | POA: Diagnosis not present

## 2018-06-27 DIAGNOSIS — I10 Essential (primary) hypertension: Secondary | ICD-10-CM | POA: Diagnosis not present

## 2018-06-27 DIAGNOSIS — I63531 Cerebral infarction due to unspecified occlusion or stenosis of right posterior cerebral artery: Secondary | ICD-10-CM | POA: Diagnosis not present

## 2018-06-27 NOTE — Telephone Encounter (Signed)
Called pt regarding their repatha because it is time to renew the pa for it but the pt stated that they were stopped from taking it by a neurologist

## 2018-06-27 NOTE — Telephone Encounter (Signed)
[employer] to ship a 30 day cardiac event monitor to your home.  Once received, please review instructions, apply monitor, and call [employer] at [phone] to send your baseline recording.  They will review instructions and answer any questions at that time.

## 2018-06-30 DIAGNOSIS — I270 Primary pulmonary hypertension: Secondary | ICD-10-CM | POA: Diagnosis not present

## 2018-06-30 DIAGNOSIS — R55 Syncope and collapse: Secondary | ICD-10-CM | POA: Diagnosis not present

## 2018-07-02 DIAGNOSIS — D485 Neoplasm of uncertain behavior of skin: Secondary | ICD-10-CM | POA: Diagnosis not present

## 2018-07-02 DIAGNOSIS — D2239 Melanocytic nevi of other parts of face: Secondary | ICD-10-CM | POA: Diagnosis not present

## 2018-07-02 DIAGNOSIS — D0422 Carcinoma in situ of skin of left ear and external auricular canal: Secondary | ICD-10-CM | POA: Diagnosis not present

## 2018-07-07 ENCOUNTER — Ambulatory Visit (INDEPENDENT_AMBULATORY_CARE_PROVIDER_SITE_OTHER): Payer: Medicare Other

## 2018-07-07 ENCOUNTER — Other Ambulatory Visit: Payer: Self-pay | Admitting: Osteopathic Medicine

## 2018-07-07 DIAGNOSIS — I639 Cerebral infarction, unspecified: Secondary | ICD-10-CM | POA: Diagnosis not present

## 2018-07-07 DIAGNOSIS — R002 Palpitations: Secondary | ICD-10-CM | POA: Diagnosis not present

## 2018-07-07 DIAGNOSIS — I2510 Atherosclerotic heart disease of native coronary artery without angina pectoris: Secondary | ICD-10-CM

## 2018-07-07 DIAGNOSIS — I6359 Cerebral infarction due to unspecified occlusion or stenosis of other cerebral artery: Secondary | ICD-10-CM | POA: Diagnosis not present

## 2018-07-07 DIAGNOSIS — I4891 Unspecified atrial fibrillation: Secondary | ICD-10-CM

## 2018-07-07 NOTE — Telephone Encounter (Signed)
Med has already been discontinued- sending to provider to advise

## 2018-07-09 NOTE — Telephone Encounter (Signed)
[facility] store requesting med refill for omeprazole. Rx not listed in active med list. Pls advise, thanks.

## 2018-07-10 DIAGNOSIS — I4892 Unspecified atrial flutter: Secondary | ICD-10-CM | POA: Diagnosis not present

## 2018-07-10 DIAGNOSIS — I2510 Atherosclerotic heart disease of native coronary artery without angina pectoris: Secondary | ICD-10-CM | POA: Diagnosis not present

## 2018-07-10 DIAGNOSIS — Z951 Presence of aortocoronary bypass graft: Secondary | ICD-10-CM | POA: Diagnosis not present

## 2018-07-10 DIAGNOSIS — E785 Hyperlipidemia, unspecified: Secondary | ICD-10-CM | POA: Diagnosis not present

## 2018-07-10 DIAGNOSIS — I63531 Cerebral infarction due to unspecified occlusion or stenosis of right posterior cerebral artery: Secondary | ICD-10-CM | POA: Diagnosis not present

## 2018-07-10 DIAGNOSIS — I25709 Atherosclerosis of coronary artery bypass graft(s), unspecified, with unspecified angina pectoris: Secondary | ICD-10-CM | POA: Diagnosis not present

## 2018-07-11 DIAGNOSIS — I639 Cerebral infarction, unspecified: Secondary | ICD-10-CM | POA: Diagnosis not present

## 2018-07-11 DIAGNOSIS — H35371 Puckering of macula, right eye: Secondary | ICD-10-CM | POA: Diagnosis not present

## 2018-07-11 DIAGNOSIS — H53462 Homonymous bilateral field defects, left side: Secondary | ICD-10-CM | POA: Diagnosis not present

## 2018-07-11 DIAGNOSIS — D3131 Benign neoplasm of right choroid: Secondary | ICD-10-CM | POA: Diagnosis not present

## 2018-07-17 ENCOUNTER — Telehealth: Payer: Self-pay

## 2018-07-18 ENCOUNTER — Other Ambulatory Visit: Payer: Self-pay | Admitting: *Deleted

## 2018-07-18 ENCOUNTER — Ambulatory Visit (HOSPITAL_COMMUNITY): Payer: Medicare Other

## 2018-07-18 NOTE — Patient Outreach (Signed)
[facility]) Care Management  07/18/2018  Nabiha Planck 29-Jul-1945 [id number]   Telephone Screen  Referral Date: 07/17/18 Referral Source: Emmi prevent  Referral Reason: EMMI Prevent score =16Please engage patient for [facility] Care Management services.  Insurance: united health care medicare     Outreach attempt # 1 No answer. [facility] RN CM left HIPAA compliant voicemail message along with CM's contact info.   Plan: [facility] RN CM sent an unsuccessful outreach letter and scheduled this patient for another call attempt within 4 business days  Kimberly L. Lavina Hamman, RN, BSN, [place] Coordinator Office number [phone] Mobile number [phone]  Main [facility] number [phone] Fax number [phone]

## 2018-07-19 ENCOUNTER — Other Ambulatory Visit: Payer: Self-pay | Admitting: *Deleted

## 2018-07-19 NOTE — Patient Outreach (Signed)
[place]) Care Management  07/19/2018  Carrie Salazar [birth year] [id number]   Telephone Screen  Referral Date: 07/17/18 Referral Source: Emmi prevent  Referral Reason: EMMI Prevent score =16Please engage patient for [facility] Care Management services.  Insurance: united health care medicare     Outreach attempt # 2 A & B  No answer at [phone].  [facility]  RN CM called the home number [phone] and a female answered He reported she was not able to speak at this time  [facility] RN CM left HIPAA compliant voicemail message along with CM's contact info.   Plan: [facility] RN CM sent an unsuccessful outreach letter on 07/18/18  CM left messages on 07/18/18 and 07/19/18  [facility]  RN CM scheduled this patient for a final call attempt within 4 business days  [place] L. Lavina Hamman, RN, BSN, [place] Coordinator Office number [phone] Mobile number [phone]  Main [facility] number [phone] Fax number [phone]

## 2018-07-20 ENCOUNTER — Other Ambulatory Visit: Payer: Self-pay | Admitting: *Deleted

## 2018-07-20 DIAGNOSIS — I2510 Atherosclerotic heart disease of native coronary artery without angina pectoris: Secondary | ICD-10-CM | POA: Diagnosis not present

## 2018-07-20 DIAGNOSIS — I4892 Unspecified atrial flutter: Secondary | ICD-10-CM | POA: Diagnosis not present

## 2018-07-20 NOTE — Patient Outreach (Signed)
[facility]) Care Management  07/20/2018  Carrie Salazar 1945-10-08 [id number]   Telephone Screen  Referral Date:07/17/18 Referral Source:Emmiprevent Referral Reason:EMMI Prevent score =16 Please engage patient for [name] services. Insurance:united health care medicare    Outreach attempt # 3   [facility]  RN CM called the home number [phone] [place] CM left HIPAA compliant voicemail message along with CM's contact info.   Plan: [facility] RN CM sent an unsuccessful outreach letter on 07/18/18  CM left messages on 07/18/18, 07/19/18 and 07/20/18 [facility]  RN CM scheduled this patient for cas closure within 7 business days  [place] L. Lavina Hamman, RN, BSN, [place] Coordinator Office number [phone] Mobile number [phone]  Main [facility] number [phone] Fax number [phone]

## 2018-07-30 ENCOUNTER — Other Ambulatory Visit: Payer: Self-pay | Admitting: Osteopathic Medicine

## 2018-07-30 DIAGNOSIS — R55 Syncope and collapse: Secondary | ICD-10-CM | POA: Diagnosis not present

## 2018-07-30 DIAGNOSIS — I270 Primary pulmonary hypertension: Secondary | ICD-10-CM | POA: Diagnosis not present

## 2018-07-30 NOTE — Telephone Encounter (Signed)
Please review for refill- patient at [facility] 

## 2018-08-01 ENCOUNTER — Other Ambulatory Visit: Payer: Self-pay | Admitting: *Deleted

## 2018-08-01 NOTE — Patient Outreach (Signed)
[facility]) Care Management  08/01/2018  Ellianna Ruest 1945/07/05 [id number]   Case closure   Referral Date:07/17/18 Referral Source:Emmiprevent Referral Reason:EMMI Prevent score =16 Please engage patient for [place] Management services. Insurance:united health care medicare  [facility] RN CM sent an unsuccessful outreach letteron 07/18/18  CM left messages on 07/18/18, 07/19/18 and 07/20/18   Plan [facility] RN CM will close case after no response from patient within 10 business days. Unable to reach Case closure letters sent to patient and MD Routed note to MD  Joelene Millin L. Lavina Hamman, RN, BSN, [place] Coordinator Office number [phone] Mobile number [phone]  Main [facility] number [phone] Fax number [phone]

## 2018-08-06 ENCOUNTER — Ambulatory Visit: Payer: Medicare Other | Admitting: Internal Medicine

## 2018-08-06 ENCOUNTER — Telehealth: Payer: Self-pay | Admitting: Internal Medicine

## 2018-08-06 NOTE — Telephone Encounter (Signed)
LVM for patient to call back to schedule Dr. Lysbeth Penner lipid appointment.

## 2018-08-09 ENCOUNTER — Other Ambulatory Visit: Payer: Self-pay | Admitting: Physician Assistant

## 2018-08-09 DIAGNOSIS — I6359 Cerebral infarction due to unspecified occlusion or stenosis of other cerebral artery: Secondary | ICD-10-CM

## 2018-08-09 DIAGNOSIS — R002 Palpitations: Secondary | ICD-10-CM

## 2018-08-09 DIAGNOSIS — I2510 Atherosclerotic heart disease of native coronary artery without angina pectoris: Secondary | ICD-10-CM

## 2018-08-09 DIAGNOSIS — I4891 Unspecified atrial fibrillation: Secondary | ICD-10-CM

## 2018-08-09 DIAGNOSIS — I639 Cerebral infarction, unspecified: Secondary | ICD-10-CM

## 2018-08-20 DIAGNOSIS — Z951 Presence of aortocoronary bypass graft: Secondary | ICD-10-CM | POA: Diagnosis not present

## 2018-08-20 DIAGNOSIS — E785 Hyperlipidemia, unspecified: Secondary | ICD-10-CM | POA: Diagnosis not present

## 2018-08-20 DIAGNOSIS — I2510 Atherosclerotic heart disease of native coronary artery without angina pectoris: Secondary | ICD-10-CM | POA: Diagnosis not present

## 2018-08-20 DIAGNOSIS — I4892 Unspecified atrial flutter: Secondary | ICD-10-CM | POA: Diagnosis not present

## 2018-08-20 DIAGNOSIS — I25709 Atherosclerosis of coronary artery bypass graft(s), unspecified, with unspecified angina pectoris: Secondary | ICD-10-CM | POA: Diagnosis not present

## 2018-08-20 DIAGNOSIS — I63531 Cerebral infarction due to unspecified occlusion or stenosis of right posterior cerebral artery: Secondary | ICD-10-CM | POA: Diagnosis not present

## 2018-08-21 ENCOUNTER — Other Ambulatory Visit: Payer: Self-pay | Admitting: Osteopathic Medicine

## 2018-08-21 DIAGNOSIS — G5793 Unspecified mononeuropathy of bilateral lower limbs: Secondary | ICD-10-CM

## 2018-08-22 ENCOUNTER — Ambulatory Visit (INDEPENDENT_AMBULATORY_CARE_PROVIDER_SITE_OTHER): Payer: Medicare Other | Admitting: Osteopathic Medicine

## 2018-08-22 ENCOUNTER — Encounter: Payer: Self-pay | Admitting: Osteopathic Medicine

## 2018-08-22 ENCOUNTER — Other Ambulatory Visit: Payer: Self-pay

## 2018-08-22 VITALS — BP 127/58 | HR 64 | Temp 99.3°F | Wt 202.7 lb

## 2018-08-22 DIAGNOSIS — M62838 Other muscle spasm: Secondary | ICD-10-CM

## 2018-08-22 DIAGNOSIS — G8929 Other chronic pain: Secondary | ICD-10-CM

## 2018-08-22 DIAGNOSIS — M545 Low back pain, unspecified: Secondary | ICD-10-CM

## 2018-08-22 MED ORDER — CYCLOBENZAPRINE HCL 10 MG PO TABS: 5.0000 mg | ORAL_TABLET | Freq: Three times a day (TID) | ORAL | 1 refills | Status: AC | PRN

## 2018-08-22 NOTE — Progress Notes (Signed)
HPI: Carrie Salazar is a 73 y.o. female who  has a past medical history of Chronic fatigue, Depression, Fatigue (08/16/2016), Fibromyalgia, Heart attack ([place]), CABG, Hyperlipidemia, Hypertension, and Migraine.  she presents to [facility] today, 08/22/18, for chief complaint of:   Neck pain . Context: No injury, thinks she just woke up with it after sleeping funny . Location: Left posterior neck . Quality: Soreness, feels stiff . Duration: 5 days . Timing/Modifying factors: Constant but worse with turning her head to the right  Bump on leg:  Concerned about a small lump on the right shin, no overlying skin changes  Patient is accompanied by daughter who assists with history-taking.    At today's visit 08/22/18 ... PMH, PSH, FH reviewed and updated as needed.  Current medication list and allergy/intolerance hx reviewed and updated as needed. (See remainder of HPI, ROS, Phys Exam below)   No results found.  No results found for this or any previous visit (from the past 72 hour(s)).        ASSESSMENT/PLAN: The primary encounter diagnosis was Muscle spasms of neck. A diagnosis of Chronic bilateral low back pain without sciatica was also pertinent to this visit.   Orders Placed This Encounter  Procedures  . Ambulatory referral to Physical Therapy     Meds ordered this encounter  Medications  . cyclobenzaprine (FLEXERIL) 10 MG tablet    Sig: Take 0.5-1 tablets (5-10 mg total) by mouth 3 (three) times daily as needed for muscle spasms. Caution: can cause drowsiness/dizziness    Dispense:  60 tablet    Refill:  1    Patient Instructions  Plan: I sent muscle relaxers OK to take Tylenol up to 1000 mg 3 times per day  Referral to physical therapy Follow-up sports medicine if not doing better        Follow-up plan: Return if symptoms worsen or fail to  improve.                                                 ################################################# ################################################# ################################################# #################################################    No outpatient medications have been marked as taking for the 08/22/18 encounter (Office Visit) with [name], DO.    Allergies  Allergen Reactions  . Albuterol Palpitations and Other (See Comments)    HALLUCINATIONS Also feels "funny in the head"  . Hydromorphone Anaphylaxis    Caused patient to have heart attack and crazy in the head  . Iodinated Diagnostic Agents Hives and Other (See Comments)    Itching and causes me to pass out Must be pre-treated with benadryl. syncope Rash    . Nitrofurantoin Itching  . Other Itching and Hives    Dial soap "eats the skin off"   . Oxycodone-Acetaminophen Itching  . Oxycodone-Aspirin Itching  . Ceftriaxone Sodium In Dextrose Rash  . Codeine Hives and Itching    itching  . Lanolin Hives    "burns skin off"  . Latex Dermatitis    LEAVES REALLY BAD BURN ON SKIN   . Levofloxacin Other (See Comments)    Dizziness, "could not sit up" per pt  . Levofloxacin In D5w Other (See Comments)    "I get real dizzy and pass out"     . Metrizamide Hives, Itching, Rash and Other (See Comments)    Itching and causes me to  pass out  . Nickel Rash  . Penicillins Hives  . Sulfamethoxazole Hives and Rash  . Sulfasalazine Hives  . Tape Itching    With latex additives.       Review of Systems:  Constitutional: No recent illness  HEENT: +chronic headache - stable, no vision change  Cardiac: No  chest pain, No  pressure, No palpitations  Respiratory:  No  shortness of breath. No  Cough  Musculoskeletal: +new myalgia/arthralgia  Skin: No  Rash  Neurologic: No new weakness  Exam:  BP (!) 127/58 (BP Location: Left Arm, Patient  Position: Sitting, Cuff Size: Normal)   Pulse 64   Temp 99.3 F (37.4 C) (Oral)   Wt 202 lb 11.2 oz (91.9 kg)   BMI 33.73 kg/m   Constitutional: VS see above. General Appearance: alert, well-developed, well-nourished, NAD  Neck: No masses, trachea midline.  Active range of motion limited, patient is uncomfortable with rotation to both sides, flexion, extension.  Ropey texture to the musculature on the left posterior.  Negative Spurling's  Respiratory: Normal respiratory effort.   Musculoskeletal: Gait normal. Symmetric and independent movement of all extremities  Skin: Feels like subcutaneous cyst or possibly ganglion cyst of the tendon sheath of anterior lower extremity distal to knee,  Neurological: Fair balance/coordination -ambulates with a cane to steady herself.       Visit summary with medication list and pertinent instructions was printed for patient to review, patient was advised to alert [place] if any updates are needed. All questions at time of visit were answered - patient instructed to contact office with any additional concerns. ER/RTC precautions were reviewed with the patient and understanding verbalized.   Note: Total time spent 25 minutes, greater than 50% of the visit was spent face-to-face counseling and coordinating care for the following: The primary encounter diagnosis was Muscle spasms of neck. A diagnosis of Chronic bilateral low back pain without sciatica was also pertinent to this visit.Marland Kitchen  Please note: voice recognition software was used to produce this document, and typos may escape review. Please contact Dr. Sheppard Coil for any needed clarifications.    Follow up plan: Return if symptoms worsen or fail to improve.

## 2018-08-22 NOTE — Patient Instructions (Signed)
Plan: I sent muscle relaxers OK to take Tylenol up to 1000 mg 3 times per day  Referral to physical therapy Follow-up sports medicine if not doing better

## 2018-08-23 ENCOUNTER — Other Ambulatory Visit: Payer: Self-pay

## 2018-08-23 ENCOUNTER — Ambulatory Visit (INDEPENDENT_AMBULATORY_CARE_PROVIDER_SITE_OTHER): Payer: Medicare Other | Admitting: Physical Therapy

## 2018-08-23 ENCOUNTER — Encounter: Payer: Self-pay | Admitting: Physical Therapy

## 2018-08-23 DIAGNOSIS — M542 Cervicalgia: Secondary | ICD-10-CM | POA: Diagnosis not present

## 2018-08-23 DIAGNOSIS — G8929 Other chronic pain: Secondary | ICD-10-CM | POA: Diagnosis not present

## 2018-08-23 DIAGNOSIS — R2681 Unsteadiness on feet: Secondary | ICD-10-CM

## 2018-08-23 DIAGNOSIS — M545 Low back pain, unspecified: Secondary | ICD-10-CM

## 2018-08-23 NOTE — Patient Instructions (Signed)
Neck Rotation    Begin with chin level, head centered over spine. Slowly turn head to look over shoulder, keeping head centered, shoulders and torso stationary. Slowly return to starting position. Repeat to other side. Repeat __10__ times to each side. Do 3x/day.  Copyright  VHI. All rights reserved.   AROM: Lateral Neck Flexion    Slowly tilt head toward one shoulder, then the other.  Repeat _10___ times per set. Do ____ sets per session. Do __3__ sessions per day.    Sit to Stand    Sit on edge of chair, feet flat on floor. Stand upright, extending knees fully. Repeat 10 times per set. Do _1-3 sets per session. Do __1-2__ sessions per day.   Madelyn Flavors, PT 08/23/18 3:49 PM  [facility] [place], [place] [address]  [phone] (office) [phone] (fax)

## 2018-08-23 NOTE — Therapy (Signed)
[address] [place], [place], [address] Phone: [phone]   Fax:  [phone]  Physical Therapy Evaluation  Patient Details  Name: Carrie Salazar MRN: [id number] Date of Birth: [birth year] Referring Provider (PT): [name]   Encounter Date: 08/23/2018  PT End of Session - 08/23/18 1453    Visit Number  1    Number of Visits  16    Date for PT Re-Evaluation  10/18/18    PT Start Time  1443    PT Stop Time  1600    PT Time Calculation (min)  67 min    Activity Tolerance  Patient tolerated treatment well    Behavior During Therapy  [facility] for tasks assessed/performed       Past Medical History:  Diagnosis Date  . Chronic fatigue   . Depression   . Fatigue 08/16/2016  . Fibromyalgia   . Heart attack ([place])   . Hx of CABG   . Hyperlipidemia   . Hypertension   . Migraine     Past Surgical History:  Procedure Laterality Date  . ABDOMINAL HYSTERECTOMY    . APPENDECTOMY    . BACK SURGERY    . BACK SURGERY     Lumbar rods  . BLADDER SURGERY    . BREAST BIOPSY Left    x 2  . BREAST EXCISIONAL BIOPSY Left    x 2  . BREAST LUMPECTOMY     left breast all benign (x2)  . CARPAL TUNNEL RELEASE Bilateral   . CORONARY ARTERY BYPASS GRAFT     Triple  . LYMPECTOMY  1980  . TONSILLECTOMY    . VAGINA SURGERY    . WRIST SURGERY      There were no vitals filed for this visit.   Subjective Assessment - 08/23/18 1508    Subjective  Patient has a long h/o of LBP including 3 back surgeries. She began having neck pain one week ago (insidious onset). That has improved some with the muscle relaxor. She has limited ROM with rotation and extension and pain. She also reports having a stroke in February affecting her left side and would like help with her balance.    Patient is accompained by:  Family member    Pertinent History  CVA, OP, HTN, MI, CHF, OA, Angina, migraines, depression, visual impairment left  eye, neuropathy bil feet, 3+ back surgeries, open heart surgery    Limitations  Sitting;Walking    Patient Stated Goals  to improve my balance    Currently in Pain?  Yes    Pain Score  7     Pain Location  Back    Pain Orientation  Lower;Left    Pain Descriptors / Indicators  Aching    Pain Type  Chronic pain    Pain Onset  More than a month ago    Pain Frequency  Constant    Aggravating Factors   walking, sitting without support    Pain Relieving Factors  meds, sitting with support    Multiple Pain Sites  Yes    Pain Score  6    Pain Location  Neck    Pain Orientation  Left    Pain Descriptors / Indicators  Aching    Pain Type  Acute pain    Pain Onset  In the past 7 days    Pain Frequency  Intermittent    Aggravating Factors   turning, looking up    Pain Relieving  Factors  rest         [facility] PT Assessment - 08/23/18 0001      Assessment   Medical Diagnosis  muscle spasm neck, chronic bil LBP    Referring Provider (PT)  [name]    Onset Date/Surgical Date  08/17/18    Hand Dominance  Right      Precautions   Precautions  Fall    Precaution Comments  multiple falls, no peripheral vision in right eye      Balance Screen   Has the patient fallen in the past 6 months  Yes    How many times?  3   ran into wall, fell out of shower, fell in bathroom   Has the patient had a decrease in activity level because of a fear of falling?   Yes    Is the patient reluctant to leave their home because of a fear of falling?   Yes      [place] residence    Living Arrangements  Spouse/significant other    Available Help at Discharge  Family    Type of [place] Access  Level entry    [place]  One level    [place] - 2 wheels;Walker - 4 wheels;Cane - single point;Tub bench;Grab bars - tub/shower;Grab bars - toilet      Prior Function   Level of Independence  Independent with community mobility with device     Vocation  Retired      Art therapist   Posture/Postural Control  Postural limitations    Postural Limitations  Rounded Shoulders;Forward head;Increased thoracic kyphosis      ROM / Strength   AROM / PROM / Strength  AROM;Strength      AROM   Overall AROM Comments  cervical pain with all directions     AROM Assessment Site  Cervical;Lumbar    Cervical Flexion  WFL    Cervical Extension  WFL    Cervical - Right Side Bend  50%    Cervical - Left Side Bend  50%    Cervical - Right Rotation  50%    Cervical - Left Rotation  50%    Lumbar Flexion  WFL    Lumbar Extension  WFL    Lumbar - Right Side Bend  WFL    Lumbar - Left Side Bend  WFL    Lumbar - Right Rotation  50%    Lumbar - Left Rotation  50%      Strength   Overall Strength Comments  Rt hip flex 4+/5, left 4-/5; knee ext 5/5 bil, DF 5/5 right, 4/5 left; bil hip ABD 4+/5, ADD 4+/5    Strength Assessment Site  --      Flexibility   Soft Tissue Assessment /Muscle Length  yes    Hamstrings  bil tightness    Piriformis  bil tightness    Obturator Internus  right ankle DF to neutral      Palpation   Palpation comment  left UT and paraspinals      Special Tests   Other special tests  neg compression/distraction      Ambulation/Gait   Ambulation/Gait  Yes    Ambulation/Gait Assistance  6: Modified independent (Device/Increase time)    Ambulation Distance (Feet)  40 Feet    Assistive device  Straight cane    Gait Pattern  Step-through pattern;Decreased step length -  right;Decreased step length - left;Decreased stride length;Decreased dorsiflexion - left;Decreased dorsiflexion - right    Ambulation Surface  Level      Standardized Balance Assessment   Standardized Balance Assessment  Five Times Sit to Stand;Timed Up and Go Test    Five times sit to stand comments    25 sec      Timed Up and Go Test   Normal TUG (seconds)  27.6    TUG Comments  with SPC                Objective measurements  completed on examination: See above findings.              PT Education - 08/23/18 1549    Education Details  HEP    Person(s) Educated  Patient    Methods  Explanation;Demonstration;Handout    Comprehension  Verbalized understanding;Returned demonstration       PT Short Term Goals - 08/23/18 1615      PT SHORT TERM GOAL #1   Title  Ind with initial HEP    Time  2    Period  Weeks    Status  New    Target Date  09/06/18        PT Long Term Goals - 08/23/18 1615      PT LONG TERM GOAL #1   Title  improved neck rotation to [facility] and pain 2/10 or less.    Time  8    Period  Weeks    Status  New    Target Date  10/18/18      PT LONG TERM GOAL #2   Baseline  Improved 5x sit to stand to 14 sec to decrease fall risk    Time  8    Period  Weeks    Status  New      PT LONG TERM GOAL #3   Title  Improved TUG score to 16 sec or less to decrease fall risk    Time  8    Period  Weeks    Status  New      PT LONG TERM GOAL #4   Title  Decreased LBP by 25% with ADLS    Time  8    Period  Weeks    Status  New             Plan - 08/23/18 1559    Clinical Impression Statement  Patient presents with c/o of neck pain beginning one week ago. She also has chronic LBP. Her main c/o is unsteadiness due to a CVA in Feb 2020 affecting her left side. She has pain with all cervical motions and decreased ROM. Her back hurts mainly with ambulation and unsupported sitting. She has had 3 falls in the past 6 months and uses a SPC at home and in the community. She has a rollator which was recommended. She will benefit from PT to address these deficits.    Personal Factors and Comorbidities  Age;Comorbidity 3+    Comorbidities  CVA, OP, HTN, MI, CHF, OA, Angina, migraines, depression, visual impairment left eye, neuropathy bil feet, 3+ back surgeries, open heart surgery    Examination-Activity Limitations  Bend;Dressing;Locomotion Level;Stand;Sit    Examination-Participation  Restrictions  Cleaning;Meal Prep;Community Activity    Stability/Clinical Decision Making  Evolving/Moderate complexity    Clinical Decision Making  High    Rehab Potential  Fair    PT Frequency  2x / week    PT Duration  8 weeks    PT Treatment/Interventions  ADLs/Self Care Home Management;Electrical Stimulation;Moist Heat;Ultrasound;Therapeutic exercise;Patient/family education;Neuromuscular re-education;Balance training;Manual techniques;Dry needling    PT Next Visit Plan  Perform BERG assessment, balance, Neck ROM/manual, strengthening    PT Home Exercise Plan  sit to stand, cervical ROM rotation and SB (please print copy)    Consulted and Agree with Plan of Care  Patient       Patient will benefit from skilled therapeutic intervention in order to improve the following deficits and impairments:  Abnormal gait, Pain, Increased muscle spasms, Postural dysfunction, Decreased activity tolerance, Decreased range of motion, Decreased strength, Impaired flexibility, Decreased balance  Visit Diagnosis: 1. Unsteadiness on feet   2. Cervicalgia   3. Chronic bilateral low back pain, unspecified whether sciatica present        Problem List Patient Active Problem List   Diagnosis Date Noted  . Multiple falls 03/09/2018  . Hot flashes 10/20/2017  . Presbyopia 08/08/2017  . Puckering of macula, right eye 08/08/2017  . Retinal hemorrhage, right eye 08/08/2017  . Rectal prolapse 07/25/2017  . Palpitations 02/01/2017  . Folate deficiency 02/01/2017  . History of anemia 02/01/2017  . B12 deficiency 02/01/2017  . Migraine   . Hypertension   . Hyperlipidemia   . Hx of CABG   . Heart attack ([place])   . Fibromyalgia   . Depression with anxiety   . Chronic fatigue   . Trochanteric bursitis, left hip 11/30/2016  . Degenerative arthritis of thumb, right 11/30/2016  . Fatigue 08/16/2016  . Vitamin D deficiency 08/09/2016  . Neuropathy 05/24/2016  . Elevated serum creatinine 03/31/2016  .  Blood pressure instability 03/31/2016  . Other headache syndrome 02/25/2016  . SOB (shortness of breath) on exertion 01/25/2016  . Anemia 01/25/2016  . Vaginal lesion 03-19-2015  . Moderate episode of recurrent major depressive disorder ([place]) 12/10/2015  . Female cystocele 10/16/2015  . Urinary urgency 10/16/2015  . History of obstructive sleep apnea 08/20/2015  . Atrial flutter ([place]) 08/20/2015  . Coronary artery disease involving coronary bypass graft of native heart with angina pectoris ([place]) 08/13/2015  . Chronic back pain 08/13/2015  . Recurrent UTI 08/13/2015  . Insomnia 08/13/2015  . Hyperhidrosis 08/13/2015    Madelyn Flavors PT 08/23/2018, 4:22 PM  [facility] [place], [place], [address] Phone: [phone]   Fax:  [phone]  Name: Malayia Spizzirri MRN: [id number] Date of Birth: [birth year]

## 2018-08-27 ENCOUNTER — Ambulatory Visit: Payer: Medicare Other | Attending: Osteopathic Medicine | Admitting: Physical Therapy

## 2018-08-27 ENCOUNTER — Encounter: Payer: Self-pay | Admitting: Physical Therapy

## 2018-08-27 ENCOUNTER — Other Ambulatory Visit: Payer: Self-pay

## 2018-08-27 DIAGNOSIS — M542 Cervicalgia: Secondary | ICD-10-CM | POA: Diagnosis not present

## 2018-08-27 DIAGNOSIS — R2681 Unsteadiness on feet: Secondary | ICD-10-CM | POA: Insufficient documentation

## 2018-08-27 DIAGNOSIS — M545 Low back pain, unspecified: Secondary | ICD-10-CM

## 2018-08-27 DIAGNOSIS — G8929 Other chronic pain: Secondary | ICD-10-CM | POA: Insufficient documentation

## 2018-08-27 NOTE — Therapy (Signed)
[facility] [place] [address]  [place], [place], [address] Phone: [phone]   Fax:  [phone]  Physical Therapy Treatment  Patient Details  Name: Carrie Salazar MRN: [id number] Date of Birth: [birth year] Referring Provider (PT): [name]   Encounter Date: 08/27/2018  PT End of Session - 08/27/18 1618    Visit Number  2    Number of Visits  16    Date for PT Re-Evaluation  10/18/18    Authorization Type  [employer] Medicare    PT Start Time  1618    PT Stop Time  1702    PT Time Calculation (min)  44 min    Activity Tolerance  Patient tolerated treatment well    Behavior During Therapy  [facility] for tasks assessed/performed       Past Medical History:  Diagnosis Date  . Chronic fatigue   . Depression   . Fatigue 08/16/2016  . Fibromyalgia   . Heart attack ([place])   . Hx of CABG   . Hyperlipidemia   . Hypertension   . Migraine     Past Surgical History:  Procedure Laterality Date  . ABDOMINAL HYSTERECTOMY    . APPENDECTOMY    . BACK SURGERY    . BACK SURGERY     Lumbar rods  . BLADDER SURGERY    . BREAST BIOPSY Left    x 2  . BREAST EXCISIONAL BIOPSY Left    x 2  . BREAST LUMPECTOMY     left breast all benign (x2)  . CARPAL TUNNEL RELEASE Bilateral   . CORONARY ARTERY BYPASS GRAFT     Triple  . LYMPECTOMY  1980  . TONSILLECTOMY    . VAGINA SURGERY    . WRIST SURGERY      There were no vitals filed for this visit.  Subjective Assessment - 08/27/18 1621    Subjective  Pt wishing to focus on balance and visual deficits (L field cut) more so than neck pain as she feels that this is resolving mostly on its own.    Pertinent History  CVA, OP, HTN, MI, CHF, OA, Angina, migraines, depression, visual impairment left eye, neuropathy bil feet, 3+ back surgeries, open heart surgery    Patient Stated Goals  to improve my balance    Currently in Pain?  Yes    Pain Score  0-No pain   has not been up as much  today   Pain Location  Back    Pain Orientation  Lower;Left    Pain Descriptors / Indicators  Aching    Pain Type  Chronic pain    Pain Frequency  Constant    Pain Score  5   when turning head to R   Pain Location  Neck    Pain Orientation  Left    Pain Type  Acute pain    Pain Frequency  Intermittent         [facility] PT Assessment - 08/27/18 1618      Ambulation/Gait   Ambulation/Gait Assistance  5: Supervision    Assistive device  Straight cane    Gait velocity  1.22 ft/sec      Standardized Balance Assessment   Standardized Balance Assessment  Berg Balance Test;10 meter walk test;Dynamic Gait Index    10 Meter Walk  26.81 sec with SPC      Berg Balance Test   Sit to Stand  Able to stand without using hands and  stabilize independently    Standing Unsupported  Able to stand safely 2 minutes    Sitting with Back Unsupported but Feet Supported on Floor or Stool  Able to sit safely and securely 2 minutes    Stand to Sit  Uses backs of legs against chair to control descent    Transfers  Able to transfer safely, minor use of hands    Standing Unsupported with Eyes Closed  Able to stand 3 seconds    Standing Unsupported with Feet Together  Able to place feet together independently and stand for 1 minute with supervision    From Standing, Reach Forward with Outstretched Arm  Can reach forward >5 cm safely (2")    From Standing Position, Pick up Object from [place] to pick up shoe, needs supervision    From Standing Position, Turn to Look Behind Over each Shoulder  Turn sideways only but maintains balance    Turn 360 Degrees  Needs close supervision or verbal cueing    Standing Unsupported, Alternately Place Feet on Step/Stool  Able to stand independently and complete 8 steps >20 seconds    Standing Unsupported, One Foot in Front  Able to take small step independently and hold 30 seconds    Standing on One Leg  Tries to lift leg/unable to hold 3 seconds but remains standing  independently    Total Score  37    Berg comment:  37-45 significant fall risk (>80%)      Dynamic Gait Index   Level Surface  Moderate Impairment    Change in Gait Speed  Moderate Impairment    Gait with Horizontal Head Turns  Moderate Impairment    Gait with Vertical Head Turns  Mild Impairment    Gait and Pivot Turn  Moderate Impairment    Step Over Obstacle  Moderate Impairment    Step Around Obstacles  Moderate Impairment    Steps  Moderate Impairment    Total Score  9    DGI comment:  Scores of 19 or less are predictive of falls in older community living adults                   [facility] Adult PT Treatment/Exercise - 08/27/18 1618      Neck Exercises: Seated   Cervical Rotation  Both;10 reps    Lateral Flexion  Both;10 reps      Knee/Hip Exercises: Seated   Sit to Sand  10 reps;without UE support               PT Short Term Goals - 08/27/18 1700      PT SHORT TERM GOAL #1   Title  Ind with initial HEP    Status  On-going    Target Date  09/06/18        PT Long Term Goals - 08/27/18 1700      PT LONG TERM GOAL #1   Title  improved neck rotation to [facility] and pain 2/10 or less.    Status  On-going    Target Date  10/18/18      PT LONG TERM GOAL #2   Baseline  Improved 5x sit to stand to 14 sec to decrease fall risk    Status  On-going    Target Date  10/18/18      PT LONG TERM GOAL #3   Title  Improved TUG score to 16 sec or less to decrease fall risk    Status  On-going    Target Date  10/18/18      PT LONG TERM GOAL #4   Title  Decreased LBP by 25% with ADLS    Status  On-going    Target Date  10/18/18      PT LONG TERM GOAL #5   Title  Improve BERG to >/= 46/56 to reduce risk for falls    Status  New    Target Date  10/18/18      PT LONG TERM GOAL #6   Title  Improve DGI to >/= 16/24 to improve gait stability    Status  New    Target Date  10/18/18      PT LONG TERM GOAL #7   Title  Increased gait speed to >/= 1.8 ft/sec with  LRAD to reduce risk for recurrent falls in home and community    Status  New    Target Date  10/18/18            Plan - 08/27/18 1739    Clinical Impression Statement  Carrie Salazar reporting her neck pain seems to be resolving to some degree, now only noting pain with R rotation. Reviewed initial HEP provided by evaluating PT and printed handout for patient - patient able to perform good return demonstration with only minor cueing necessary. Completed balance assessment with BERG (37/56) and DGI (9/24) with both tests revealing a significant to high risk for falls. Gait speed of 1.22 ft/sec also indicating a recurrent fall risk with speed indicative of a household ambulator. Carrie Salazar expressing greater concern regarding balance visual deficits vs neck and back pain, and would like to focus more heavily on the former. Will plan to establish HEP for neck and back concerns over first few visits and then shift focus to balance and gait safety as patient demonstrating good comfort with initial HEP.    Comorbidities  CVA, OP, HTN, MI, CHF, OA, Angina, migraines, depression, visual impairment left eye (L visual field cut), neuropathy bil feet, 3+ back surgeries, open heart surgery    Rehab Potential  Fair    PT Frequency  2x / week    PT Duration  8 weeks    PT Treatment/Interventions  ADLs/Self Care Home Management;Electrical Stimulation;Moist Heat;Ultrasound;Therapeutic exercise;Patient/family education;Neuromuscular re-education;Balance training;Manual techniques;Dry needling    PT Next Visit Plan  Neck ROM/manual, core/postural strengthening    PT Home Exercise Plan  sit to stand, cervical ROM rotation and SB    Consulted and Agree with Plan of Care  Patient       Patient will benefit from skilled therapeutic intervention in order to improve the following deficits and impairments:  Abnormal gait, Pain, Increased muscle spasms, Postural dysfunction, Decreased activity tolerance, Decreased range of  motion, Decreased strength, Impaired flexibility, Decreased balance, Impaired vision/preception  Visit Diagnosis: 1. Unsteadiness on feet   2. Cervicalgia   3. Chronic bilateral low back pain, unspecified whether sciatica present        Problem List Patient Active Problem List   Diagnosis Date Noted  . Multiple falls 03/09/2018  . Hot flashes 10/20/2017  . Presbyopia 08/08/2017  . Puckering of macula, right eye 08/08/2017  . Retinal hemorrhage, right eye 08/08/2017  . Rectal prolapse 07/25/2017  . Palpitations 02/01/2017  . Folate deficiency 02/01/2017  . History of anemia 02/01/2017  . B12 deficiency 02/01/2017  . Migraine   . Hypertension   . Hyperlipidemia   . Hx of CABG   . Heart attack ([place])   .  Fibromyalgia   . Depression with anxiety   . Chronic fatigue   . Trochanteric bursitis, left hip 11/30/2016  . Degenerative arthritis of thumb, right 11/30/2016  . Fatigue 08/16/2016  . Vitamin D deficiency 08/09/2016  . Neuropathy 05/24/2016  . Elevated serum creatinine 03/31/2016  . Blood pressure instability 03/31/2016  . Other headache syndrome 02/25/2016  . SOB (shortness of breath) on exertion 01/25/2016  . Anemia 01/25/2016  . Vaginal lesion 05/26/2015  . Moderate episode of recurrent major depressive disorder ([place]) 12/10/2015  . Female cystocele 10/16/2015  . Urinary urgency 10/16/2015  . History of obstructive sleep apnea 08/20/2015  . Atrial flutter ([place]) 08/20/2015  . Coronary artery disease involving coronary bypass graft of native heart with angina pectoris ([place]) 08/13/2015  . Chronic back pain 08/13/2015  . Recurrent UTI 08/13/2015  . Insomnia 08/13/2015  . Hyperhidrosis 08/13/2015    Percival Spanish, PT, MPT 08/27/2018, 6:01 PM  [facility] [address]  [place], [place], [address] Phone: [phone]   Fax:  [phone]  Name: Carrie Salazar MRN: [id number] Date of Birth:  01/12/1945

## 2018-08-28 ENCOUNTER — Telehealth: Payer: Self-pay

## 2018-08-28 NOTE — Telephone Encounter (Signed)
Carrie Salazar called and states her son did a rapid COVID-19 test and it came back positive today. She did see him last Wednesday with no symptoms and yesterday with some symptoms. She wanted to know how long she should wait before she should be tested. She has no symptoms.

## 2018-08-29 ENCOUNTER — Ambulatory Visit: Payer: Medicare Other

## 2018-08-29 ENCOUNTER — Inpatient Hospital Stay (HOSPITAL_COMMUNITY): Admission: RE | Admit: 2018-08-29 | Payer: Medicare Other | Source: Ambulatory Visit

## 2018-08-29 NOTE — Telephone Encounter (Signed)
Called patient and LM on VM of instructions. KG LPN

## 2018-08-29 NOTE — Telephone Encounter (Signed)
She can self isolate for 14 days and if no symptoms develop then nothing to worry about.  If she wants to get testing now, would refer her to the drive-through testing at [facility].

## 2018-08-30 DIAGNOSIS — I270 Primary pulmonary hypertension: Secondary | ICD-10-CM | POA: Diagnosis not present

## 2018-08-30 DIAGNOSIS — R55 Syncope and collapse: Secondary | ICD-10-CM | POA: Diagnosis not present

## 2018-09-04 ENCOUNTER — Encounter: Payer: Self-pay | Admitting: Physical Therapy

## 2018-09-07 DIAGNOSIS — D0422 Carcinoma in situ of skin of left ear and external auricular canal: Secondary | ICD-10-CM | POA: Diagnosis not present

## 2018-09-11 ENCOUNTER — Encounter: Payer: Self-pay | Admitting: Physical Therapy

## 2018-09-11 ENCOUNTER — Other Ambulatory Visit: Payer: Self-pay

## 2018-09-11 ENCOUNTER — Ambulatory Visit: Payer: Medicare Other | Attending: Osteopathic Medicine | Admitting: Physical Therapy

## 2018-09-11 DIAGNOSIS — G8929 Other chronic pain: Secondary | ICD-10-CM

## 2018-09-11 DIAGNOSIS — M545 Low back pain: Secondary | ICD-10-CM | POA: Diagnosis not present

## 2018-09-11 DIAGNOSIS — R2681 Unsteadiness on feet: Secondary | ICD-10-CM | POA: Insufficient documentation

## 2018-09-11 DIAGNOSIS — M542 Cervicalgia: Secondary | ICD-10-CM

## 2018-09-11 NOTE — Therapy (Signed)
[facility] [address]  [place], [place], [address] Phone: [phone]   Fax:  [phone]  Physical Therapy Treatment  Patient Details  Name: Carrie Salazar MRN: [id number] Date of Birth: 06/18/1945 Referring Provider (PT): [name]   Encounter Date: 09/11/2018  PT End of Session - 09/11/18 1622    Visit Number  3    Number of Visits  16    Date for PT Re-Evaluation  10/18/18    Authorization Type  UHC Medicare    PT Start Time  1622   Pt arrived late   PT Stop Time  1702    PT Time Calculation (min)  40 min    Activity Tolerance  Patient tolerated treatment well    Behavior During Therapy  [facility] for tasks assessed/performed       Past Medical History:  Diagnosis Date  . Chronic fatigue   . Depression   . Fatigue 08/16/2016  . Fibromyalgia   . Heart attack ([place])   . Hx of CABG   . Hyperlipidemia   . Hypertension   . Migraine     Past Surgical History:  Procedure Laterality Date  . ABDOMINAL HYSTERECTOMY    . APPENDECTOMY    . BACK SURGERY    . BACK SURGERY     Lumbar rods  . BLADDER SURGERY    . BREAST BIOPSY Left    x 2  . BREAST EXCISIONAL BIOPSY Left    x 2  . BREAST LUMPECTOMY     left breast all benign (x2)  . CARPAL TUNNEL RELEASE Bilateral   . CORONARY ARTERY BYPASS GRAFT     Triple  . LYMPECTOMY  1980  . TONSILLECTOMY    . VAGINA SURGERY    . WRIST SURGERY      There were no vitals filed for this visit.  Subjective Assessment - 09/11/18 1625    Subjective  Pt reporting her son tested positive for COVID-19 14 days ago but she has not had any symptoms herself and her son's f/u testing was negative.    Pertinent History  CVA, OP, HTN, MI, CHF, OA, Angina, migraines, depression, visual impairment left eye, neuropathy bil feet, 3+ back surgeries, open heart surgery    Patient Stated Goals  to improve my balance    Currently in Pain?  No/denies                        [facility] Adult PT Treatment/Exercise - 09/11/18 1622      Knee/Hip Exercises: Aerobic   Nustep  L3 x 6 min (UE/LE)          Balance Exercises - 09/11/18 1622      [place] PROGRAM   Head Movements  Standing;5 reps    Neck Movements  Standing;5 reps    Back Extension  Standing;5 reps   back to counter - deferred from HEP    Trunk Movements  Standing;5 reps    Ankle Movements  Sitting;10 reps    Knee Extensor  10 reps    Knee Flexor  10 reps    Hip ABductor  10 reps    Ankle Plantorflexors  --   15 reps - support   Ankle Dorsiflexors  --   15 reps - support   Knee Bends  10 reps, support    Backwards Walking  Support    Walking and Turning Around  --   deferred  today d/t visual deficit   Sideways Walking  Assistive device        PT Education - 09/11/18 1700    Education Details  Initiation of training in [facility] - patient instructed to only attempt activities highlighted in yellow    Person(s) Educated  Patient    Methods  Explanation;Demonstration;Verbal cues;Tactile cues;Handout    Comprehension  Verbalized understanding;Returned demonstration;Verbal cues required;Tactile cues required;Need further instruction       PT Short Term Goals - 09/11/18 1628      PT SHORT TERM GOAL #1   Title  Ind with initial HEP    Status  On-going    Target Date  09/25/18   date extended due to 2 week COVID-19 isolation       PT Long Term Goals - 08/27/18 1700      PT LONG TERM GOAL #1   Title  improved neck rotation to [facility] and pain 2/10 or less.    Status  On-going    Target Date  10/18/18      PT LONG TERM GOAL #2   Baseline  Improved 5x sit to stand to 14 sec to decrease fall risk    Status  On-going    Target Date  10/18/18      PT LONG TERM GOAL #3   Title  Improved TUG score to 16 sec or less to decrease fall risk    Status  On-going    Target Date  10/18/18      PT LONG TERM GOAL #4   Title  Decreased LBP  by 25% with ADLS    Status  On-going    Target Date  10/18/18      PT LONG TERM GOAL #5   Title  Improve BERG to >/= 46/56 to reduce risk for falls    Status  New    Target Date  10/18/18      PT LONG TERM GOAL #6   Title  Improve DGI to >/= 16/24 to improve gait stability    Status  New    Target Date  10/18/18      PT LONG TERM GOAL #7   Title  Increased gait speed to >/= 1.8 ft/sec with LRAD to reduce risk for recurrent falls in home and community    Status  New    Target Date  10/18/18            Plan - 09/11/18 1629    Clinical Impression Statement  Carrie Salazar denying pain today and expressing desire to focus on balance and visual deficits rather than neck or back today, therefore introduced [employer] at supported level as initial HEP. Activities safe for HEP performance highlighted in yellow with a few activities deferred due to limited patient confidence (trunk extension) or determined to be unsafe at present due to visual deficits (walking & turning around). Unable to complete training in full program due to fatigue requiring intermittent seated rest breaks, therefore patient instructed to bring booklet back for next visit to complete training.    Comorbidities  CVA, OP, HTN, MI, CHF, OA, Angina, migraines, depression, visual impairment left eye (L visual field cut), neuropathy bil feet, 3+ back surgeries, open heart surgery    Rehab Potential  Fair    PT Frequency  2x / week    PT Duration  8 weeks    PT Treatment/Interventions  ADLs/Self Care Home Management;Electrical Stimulation;Moist Heat;Ultrasound;Therapeutic exercise;Patient/family education;Neuromuscular re-education;Balance training;Manual techniques;Dry  needling    PT Next Visit Plan  Complete training in [place] program, core/postural strengthening, neck ROM/manual as indicated    PT Home Exercise Plan  sit to stand, cervical ROM rotation and SB; [place] Fall Prevention Program (yellow highlighted  activities)    Consulted and Agree with Plan of Care  Patient       Patient will benefit from skilled therapeutic intervention in order to improve the following deficits and impairments:  Abnormal gait, Pain, Increased muscle spasms, Postural dysfunction, Decreased activity tolerance, Decreased range of motion, Decreased strength, Impaired flexibility, Decreased balance, Impaired vision/preception  Visit Diagnosis: Unsteadiness on feet  Cervicalgia  Chronic bilateral low back pain, unspecified whether sciatica present     Problem List Patient Active Problem List   Diagnosis Date Noted  . Multiple falls 03/09/2018  . Hot flashes 10/20/2017  . Presbyopia 08/08/2017  . Puckering of macula, right eye 08/08/2017  . Retinal hemorrhage, right eye 08/08/2017  . Rectal prolapse 07/25/2017  . Palpitations 02/01/2017  . Folate deficiency 02/01/2017  . History of anemia 02/01/2017  . B12 deficiency 02/01/2017  . Migraine   . Hypertension   . Hyperlipidemia   . Hx of CABG   . Heart attack ([place])   . Fibromyalgia   . Depression with anxiety   . Chronic fatigue   . Trochanteric bursitis, left hip 11/30/2016  . Degenerative arthritis of thumb, right 11/30/2016  . Fatigue 08/16/2016  . Vitamin D deficiency 08/09/2016  . Neuropathy 05/24/2016  . Elevated serum creatinine 03/31/2016  . Blood pressure instability 03/31/2016  . Other headache syndrome 02/25/2016  . SOB (shortness of breath) on exertion 01/25/2016  . Anemia 01/25/2016  . Vaginal lesion 04-22-202017  . Moderate episode of recurrent major depressive disorder ([place]) 12/10/2015  . Female cystocele 10/16/2015  . Urinary urgency 10/16/2015  . History of obstructive sleep apnea 08/20/2015  . Atrial flutter ([place]) 08/20/2015  . Coronary artery disease involving coronary bypass graft of native heart with angina pectoris ([place]) 08/13/2015  . Chronic back pain 08/13/2015  . Recurrent UTI 08/13/2015  . Insomnia 08/13/2015  .  Hyperhidrosis 08/13/2015    Percival Spanish, PT, MPT 09/11/2018, 6:54 PM  [facility] [address]  [place], [place], [address] Phone: [phone]   Fax:  [phone]  Name: Carrie Salazar MRN: [id number] Date of Birth: [birth year]

## 2018-09-13 ENCOUNTER — Other Ambulatory Visit: Payer: Self-pay

## 2018-09-13 ENCOUNTER — Ambulatory Visit: Payer: Medicare Other | Admitting: Physical Therapy

## 2018-09-13 ENCOUNTER — Encounter: Payer: Self-pay | Admitting: Physical Therapy

## 2018-09-13 DIAGNOSIS — R2681 Unsteadiness on feet: Secondary | ICD-10-CM | POA: Diagnosis not present

## 2018-09-13 DIAGNOSIS — M545 Low back pain: Secondary | ICD-10-CM | POA: Diagnosis not present

## 2018-09-13 DIAGNOSIS — M542 Cervicalgia: Secondary | ICD-10-CM

## 2018-09-13 DIAGNOSIS — G8929 Other chronic pain: Secondary | ICD-10-CM | POA: Diagnosis not present

## 2018-09-13 NOTE — Therapy (Addendum)
[facility] [address]  [name], [place], [address] Phone: [phone]   Fax:  [phone]  Physical Therapy Treatment / Discharge Summary  Patient Details  Name: Carrie Salazar MRN: [id number] Date of Birth: 11/19/1945 Referring Provider (PT): [name]   Encounter Date: 09/13/2018  PT End of Session - 09/13/18 1618    Visit Number  4    Number of Visits  16    Date for PT Re-Evaluation  10/18/18    Authorization Type  UHC Medicare    PT Start Time  1618    PT Stop Time  1700    PT Time Calculation (min)  42 min    Activity Tolerance  Patient tolerated treatment well    Behavior During Therapy  [facility] for tasks assessed/performed       Past Medical History:  Diagnosis Date  . Chronic fatigue   . Depression   . Fatigue 08/16/2016  . Fibromyalgia   . Heart attack ([place])   . Hx of CABG   . Hyperlipidemia   . Hypertension   . Migraine     Past Surgical History:  Procedure Laterality Date  . ABDOMINAL HYSTERECTOMY    . APPENDECTOMY    . BACK SURGERY    . BACK SURGERY     Lumbar rods  . BLADDER SURGERY    . BREAST BIOPSY Left    x 2  . BREAST EXCISIONAL BIOPSY Left    x 2  . BREAST LUMPECTOMY     left breast all benign (x2)  . CARPAL TUNNEL RELEASE Bilateral   . CORONARY ARTERY BYPASS GRAFT     Triple  . LYMPECTOMY  1980  . TONSILLECTOMY    . VAGINA SURGERY    . WRIST SURGERY      There were no vitals filed for this visit.  Subjective Assessment - 09/13/18 1621    Subjective  Pt forgot her [place] booklet today, Reporting she thinks she may have twisted her L knee as it is bothering her a bit today but declines rating it as painful.    Pertinent History  CVA, OP, HTN, MI, CHF, OA, Angina, migraines, depression, visual impairment left eye, neuropathy bil feet, 3+ back surgeries, open heart surgery    Patient Stated Goals  to improve my balance    Currently in Pain?  No/denies                        [facility] Adult PT Treatment/Exercise - 09/13/18 1618      Neck Exercises: Seated   Other Seated Exercise  B scap retraction + shoulder horiz ABD with red TB x 5 - deferred d/t R shoulder pain      Lumbar Exercises: Standing   Row  Both;15 reps;Theraband;Strengthening    Theraband Level (Row)  Level 2 (Red)    Row Limitations  staggered stance for increased abdominal recruitment - repeated cueing for pacing & proper movement pattern    Shoulder Extension  Both;10 reps;Theraband;Strengthening    Theraband Level (Shoulder Extension)  Level 2 (Red)    Shoulder Extension Limitations  staggered stance for increased abdominal recruitment - repeated cueing for pacing & proper movement pattern      Lumbar Exercises: Supine   Clam  10 reps;3 seconds    Clam Limitations  alt hip ABD/ER with red TB    Bent Knee Raise  10 reps;3 seconds    Bent  Knee Raise Limitations  brace marching    Bridge  10 reps;3 seconds    Bridge Limitations  + hip ABD isometric with red TB   LBP reported on 1st 1-2 reps but resolved     Knee/Hip Exercises: Aerobic   Nustep  L3 x 6 min (UE/LE)      Knee/Hip Exercises: Seated   Marching  Both;10 reps      Shoulder Exercises: Seated   Extension  Both;10 reps;Theraband;Strengthening    Theraband Level (Shoulder Extension)  Level 2 (Red)    Extension Limitations  cues for abd bracing & upright posture as pt repeated trying to lean forward    Row  Both;10 reps;Theraband;Strengthening    Theraband Level (Shoulder Row)  Level 2 (Red)    Row Limitations  cues for abd bracing & upright posture             PT Education - 09/13/18 1658    Education Details  HEP - supine lumbopelvic/core strengthening/stabilization    Person(s) Educated  Patient    Methods  Explanation;Demonstration;Verbal cues;Tactile cues;Handout   handout printed 1 exercise per page due to visual deficits   Comprehension  Verbalized understanding;Returned  demonstration;Tactile cues required;Verbal cues required;Need further instruction       PT Short Term Goals - 09/11/18 1628      PT SHORT TERM GOAL #1   Title  Ind with initial HEP    Status  On-going    Target Date  09/25/18   date extended due to 2 week COVID-19 isolation       PT Long Term Goals - 08/27/18 1700      PT LONG TERM GOAL #1   Title  improved neck rotation to [facility] and pain 2/10 or less.    Status  On-going    Target Date  10/18/18      PT LONG TERM GOAL #2   Baseline  Improved 5x sit to stand to 14 sec to decrease fall risk    Status  On-going    Target Date  10/18/18      PT LONG TERM GOAL #3   Title  Improved TUG score to 16 sec or less to decrease fall risk    Status  On-going    Target Date  10/18/18      PT LONG TERM GOAL #4   Title  Decreased LBP by 25% with ADLS    Status  On-going    Target Date  10/18/18      PT LONG TERM GOAL #5   Title  Improve BERG to >/= 46/56 to reduce risk for falls    Status  New    Target Date  10/18/18      PT LONG TERM GOAL #6   Title  Improve DGI to >/= 16/24 to improve gait stability    Status  New    Target Date  10/18/18      PT LONG TERM GOAL #7   Title  Increased gait speed to >/= 1.8 ft/sec with LRAD to reduce risk for recurrent falls in home and community    Status  New    Target Date  10/18/18            Plan - 09/13/18 1624    Clinical Impression Statement  Shaida arriving to PT having forgotten her [place] booklet, therefore deferred completion of training in [place] program and focused therapy on basic strengthening program for core stability for neck and back. Patient  with significant difficulty providing return demonstration with most exercises attempted with exception of supine lumbopelvic strengthening, therefore only provided home instructions for these exercises.    Comorbidities  CVA, OP, HTN, MI, CHF, OA, Angina, migraines, depression, visual impairment left eye (L visual field cut),  neuropathy bil feet, 3+ back surgeries, open heart surgery    Rehab Potential  Fair    PT Frequency  2x / week    PT Duration  8 weeks    PT Treatment/Interventions  ADLs/Self Care Home Management;Electrical Stimulation;Moist Heat;Ultrasound;Therapeutic exercise;Patient/family education;Neuromuscular re-education;Balance training;Manual techniques;Dry needling    PT Next Visit Plan  Complete training in [place] program, core/postural strengthening, neck ROM/manual as indicated    PT Home Exercise Plan  sit to stand, cervical ROM rotation and SB; [place] Fall Prevention Program (yellow highlighted activities)    Consulted and Agree with Plan of Care  Patient       Patient will benefit from skilled therapeutic intervention in order to improve the following deficits and impairments:  Abnormal gait, Pain, Increased muscle spasms, Postural dysfunction, Decreased activity tolerance, Decreased range of motion, Decreased strength, Impaired flexibility, Decreased balance, Impaired vision/preception  Visit Diagnosis: Unsteadiness on feet  Cervicalgia  Chronic bilateral low back pain, unspecified whether sciatica present     Problem List Patient Active Problem List   Diagnosis Date Noted  . Multiple falls 03/09/2018  . Hot flashes 10/20/2017  . Presbyopia 08/08/2017  . Puckering of macula, right eye 08/08/2017  . Retinal hemorrhage, right eye 08/08/2017  . Rectal prolapse 07/25/2017  . Palpitations 02/01/2017  . Folate deficiency 02/01/2017  . History of anemia 02/01/2017  . B12 deficiency 02/01/2017  . Migraine   . Hypertension   . Hyperlipidemia   . Hx of CABG   . Heart attack ([place])   . Fibromyalgia   . Depression with anxiety   . Chronic fatigue   . Trochanteric bursitis, left hip 11/30/2016  . Degenerative arthritis of thumb, right 11/30/2016  . Fatigue 08/16/2016  . Vitamin D deficiency 08/09/2016  . Neuropathy 05/24/2016  . Elevated serum creatinine 03/31/2016  . Blood  pressure instability 03/31/2016  . Other headache syndrome 02/25/2016  . SOB (shortness of breath) on exertion 01/25/2016  . Anemia 01/25/2016  . Vaginal lesion 2020/08/715  . Moderate episode of recurrent major depressive disorder ([place]) 12/10/2015  . Female cystocele 10/16/2015  . Urinary urgency 10/16/2015  . History of obstructive sleep apnea 08/20/2015  . Atrial flutter ([place]) 08/20/2015  . Coronary artery disease involving coronary bypass graft of native heart with angina pectoris ([place]) 08/13/2015  . Chronic back pain 08/13/2015  . Recurrent UTI 08/13/2015  . Insomnia 08/13/2015  . Hyperhidrosis 08/13/2015    Percival Spanish, PT, MPT 09/13/2018, 5:24 PM  [facility] [address]  [place], [place], [address] Phone: [phone]   Fax:  [phone]  Name: Farha Dano MRN: [id number] Date of Birth: Jan 15, 1945   PHYSICAL THERAPY DISCHARGE SUMMARY  Visits from Start of Care: 4  Current functional level related to goals / functional outcomes:   Refer to above clinical impression for status as of last visit on 09/13/2018. On 10/01/2018, patient called clinic cancelling all future appointments and requesting to be discharged from PT as she does not feel PT is helping, however since eval she has only completed 4 visits and has missed 7 visits due to cancellations (3 visits in August due to isolation following potential COVID-19 exposure and all  visits since 09/13/2018 for varying reasons). Will proceed with discharge from PT for this episode per patient request.   Remaining deficits:   As above. Unable to formally assess status at discharge due to failure to return to PT.   Education / Equipment:   HEP, initiation of [employer] (unable to complete training as patient failed to return for further PT visits).  Plan: Patient agrees to discharge.  Patient goals were not met. Patient is being discharged due to  the patient's request.  ?????     Percival Spanish, PT, MPT 10/02/18, 11:22 AM  [facility] [address]  [place], [place], [address] Phone: [phone]   Fax:  [phone]

## 2018-09-18 ENCOUNTER — Ambulatory Visit: Payer: Medicare Other | Admitting: Physical Therapy

## 2018-09-19 ENCOUNTER — Ambulatory Visit (INDEPENDENT_AMBULATORY_CARE_PROVIDER_SITE_OTHER): Payer: Medicare Other | Admitting: Osteopathic Medicine

## 2018-09-19 DIAGNOSIS — Z23 Encounter for immunization: Secondary | ICD-10-CM | POA: Diagnosis not present

## 2018-09-19 NOTE — Progress Notes (Signed)
Pt here for flu shot. Afebrile,no recent illness. Vaccination given, pt tolerated well..Dohn Stclair Lynetta, CMA  

## 2018-09-20 ENCOUNTER — Ambulatory Visit: Payer: Medicare Other | Admitting: Osteopathic Medicine

## 2018-09-20 ENCOUNTER — Ambulatory Visit: Payer: Medicare Other

## 2018-09-25 ENCOUNTER — Ambulatory Visit: Payer: Medicare Other | Admitting: Physical Therapy

## 2018-09-25 ENCOUNTER — Other Ambulatory Visit: Payer: Self-pay | Admitting: Osteopathic Medicine

## 2018-09-25 NOTE — Telephone Encounter (Signed)
Requested medication (s) are due for refill today: yes  Requested medication (s) are on the active medication list: yes  Last refill:  07/30/2018  Future visit scheduled: no  Notes to clinic:  Refill cannot be delegated  Requested Prescriptions  Pending Prescriptions Disp Refills   traMADol (ULTRAM) 50 MG tablet [Pharmacy Med Name: TRAMADOL 50MG  TABLETS] 180 tablet     Sig: TAKE 2 TABLETS BY MOUTH THREE TIMES DAILY AS NEEDED FOR PAIN     Not Delegated - Analgesics:  Opioid Agonists Failed - 09/25/2018 11:17 AM      Failed - This refill cannot be delegated      Failed - Urine Drug Screen completed in last 360 days.      Passed - Valid encounter within last 6 months    Recent Outpatient Visits          6 days ago Need for immunization against influenza   [facility], Lanelle Bal, DO   1 month ago Muscle spasms of neck   [facility], Lanelle Bal, DO   3 months ago CVA, old, disturbances of vision   [facility], Lanelle Bal, DO   4 months ago [place], Lanelle Bal, DO   6 months ago [facility], Lanelle Bal, DO      Future Appointments            In 2 weeks [facility]

## 2018-09-30 DIAGNOSIS — R55 Syncope and collapse: Secondary | ICD-10-CM | POA: Diagnosis not present

## 2018-09-30 DIAGNOSIS — I270 Primary pulmonary hypertension: Secondary | ICD-10-CM | POA: Diagnosis not present

## 2018-10-01 ENCOUNTER — Ambulatory Visit: Payer: Medicare Other

## 2018-10-02 ENCOUNTER — Ambulatory Visit: Payer: Medicare Other | Admitting: Physical Therapy

## 2018-10-04 ENCOUNTER — Encounter: Payer: Self-pay | Admitting: Physical Therapy

## 2018-10-08 NOTE — Progress Notes (Signed)
Subjective:   Carrie Salazar is a 73 y.o. female who presents for Medicare Annual (Subsequent) preventive examination.  Review of Systems:  No ROS.  Medicare Wellness Virtual Visit.  Visual/audio telehealth visit, UTA vital signs.   See social history for additional risk factors.    Cardiac Risk Factors include: advanced age (>67men, >18 women);dyslipidemia;hypertension;sedentary lifestyle Sleep patterns:  Getting 10 hours of sleep a night. Wakes up 1 time a night to go and void. Wakes up feeling sluggish  Home Safety/Smoke Alarms: Feels safe in home. Smoke alarms in place.  Living environment;Lives with husband in a 1 story home, no stairs in or around the home. Shower is a step over tub with grab bars in place and bench in place Seat Belt Safety/Bike Helmet: Wears seat belt.   Female:   Pap- Aged out      Mammo- scheduled while in office      Dexa scan- ordered       CCS-ordered    Objective:     Vitals: There were no vitals taken for this visit.  There is no height or weight on file to calculate BMI.  Advanced Directives 10/09/2018 10/15/2017 09/25/2017 02/29/2016 09/24/2015 08/13/2015  Does Patient Have a Medical Advance Directive? Yes No No No No No;Yes  Type of Paramedic of [place];Living will - - - - Living will  Does patient want to make changes to medical advance directive? No - Patient declined - - - - No - Patient declined  Copy of [place] in Chart? No - copy requested - - - - No - copy requested  Would patient like information on creating a medical advance directive? - - Yes (MAU/Ambulatory/Procedural Areas - Information given) No - Patient declined No - patient declined information No - patient declined information    Tobacco Social History   Tobacco Use  Smoking Status Former Smoker  . Quit date: 2003  . Years since quitting: 17.7  Smokeless Tobacco Never Used     Counseling given: Not Answered   Clinical  Intake:  Pre-visit preparation completed: Yes  Pain : No/denies pain     Nutritional Risks: None Diabetes: No  How often do you need to have someone help you when you read instructions, pamphlets, or other written materials from your doctor or pharmacy?: 1 - Never What is the last grade level you completed in school?: 13  Interpreter Needed?: No  Information entered by :: Orlie Dakin, LPN  Past Medical History:  Diagnosis Date  . Chronic fatigue   . Depression   . Fatigue 08/16/2016  . Fibromyalgia   . Heart attack ([place])   . Hx of CABG   . Hyperlipidemia   . Hypertension   . Migraine   . Stroke [facility])    Past Surgical History:  Procedure Laterality Date  . ABDOMINAL HYSTERECTOMY    . APPENDECTOMY    . BACK SURGERY    . BACK SURGERY     Lumbar rods  . BLADDER SURGERY    . BREAST BIOPSY Left    x 2  . BREAST EXCISIONAL BIOPSY Left    x 2  . BREAST LUMPECTOMY     left breast all benign (x2)  . CARPAL TUNNEL RELEASE Bilateral   . CORONARY ARTERY BYPASS GRAFT     Triple  . LYMPECTOMY  1980  . TONSILLECTOMY    . VAGINA SURGERY    . WRIST SURGERY     Family History  Problem Relation Age of Onset  . Cancer Mother   . Hyperlipidemia Father   . Hypertension Father   . Stroke Father   . Heart attack Father   . Hyperlipidemia Maternal Grandmother   . Hypertension Maternal Grandmother   . Stroke Maternal Grandmother   . Hemophilia Maternal Grandfather    Social History   Socioeconomic History  . Marital status: Married    Spouse name: [name]  . Number of children: 2  . Years of education: 27  . Highest education level: 12th grade  Occupational History  . Occupation: Retired    Comment: Water quality scientist  . Financial resource strain: Not very hard  . Food insecurity    Worry: Never true    Inability: Never true  . Transportation needs    Medical: No    Non-medical: No  Tobacco Use  . Smoking status: Former Smoker    Quit date: 2003    Years  since quitting: 17.7  . Smokeless tobacco: Never Used  Substance and Sexual Activity  . Alcohol use: Yes    Alcohol/week: 1.0 standard drinks    Types: 1 Cans of beer per week    Comment: 1 q wk  . Drug use: No  . Sexual activity: Yes    Partners: Male    Comment: Married  Lifestyle  . Physical activity    Days per week: 0 days    Minutes per session: 0 min  . Stress: Only a little  Relationships  . Social connections    Talks on phone: More than three times a week    Gets together: Twice a week    Attends religious service: Never    Active member of club or organization: No    Attends meetings of clubs or organizations: Never    Relationship status: Married  Other Topics Concern  . Not on file  Social History Narrative   Lives at home w/ her husband   Right-handed   Caffeine: 2 cups per day   Going to church sometimes with daughter. Adjusting to the move and being in new place.    Outpatient Encounter Medications as of 10/09/2018  Medication Sig  . acetaZOLAMIDE (DIAMOX) 125 MG tablet Take 1 tablet (125 mg total) by mouth daily as needed (migraine).  Marland Kitchen amLODipine (NORVASC) 5 MG tablet Take 1 tablet (5 mg total) by mouth daily.  Marland Kitchen apixaban (ELIQUIS) 5 MG TABS tablet Take 1 tablet by mouth daily.  Marland Kitchen atorvastatin (LIPITOR) 20 MG tablet Take 1 tablet (20 mg total) by mouth daily.  . cetirizine (ZYRTEC) 5 MG tablet Take 1 tablet (5 mg total) by mouth daily.  . cholecalciferol (VITAMIN D) 1000 units tablet Take 1,000 Units by mouth daily.  . clopidogrel (PLAVIX) 75 MG tablet Take 1 tablet (75 mg total) by mouth daily.  . cyclobenzaprine (FLEXERIL) 10 MG tablet Take 0.5-1 tablets (5-10 mg total) by mouth 3 (three) times daily as needed for muscle spasms. Caution: can cause drowsiness/dizziness  . escitalopram (LEXAPRO) 20 MG tablet Take 1 tablet (20 mg total) by mouth daily.  . fluticasone (FLONASE) 50 MCG/ACT nasal spray Place 1 spray into both nostrils daily.  Marland Kitchen gabapentin  (NEURONTIN) 600 MG tablet TAKE 1 TABLET BY MOUTH EVERY MORNING, 1 TABLET IN THE AFTERNOON, AND 2 TABLETS EVERY EVENING  . ipratropium (ATROVENT HFA) 17 MCG/ACT inhaler Inhale 2 puffs into the lungs every 4 (four) hours as needed for wheezing.  . isosorbide mononitrate (IMDUR) 30 MG  24 hr tablet Take 1 tablet (30 mg total) by mouth daily.  Marland Kitchen lisinopril (PRINIVIL,ZESTRIL) 40 MG tablet Take 1 tablet (40 mg total) by mouth daily.  . metoprolol succinate (TOPROL-XL) 100 MG 24 hr tablet Take 1 tablet (100 mg total) by mouth daily. Take with or immediately following a meal.  . Multiple Vitamins-Minerals (HAIR SKIN AND NAILS FORMULA PO) Take 1 tablet by mouth 2 (two) times daily.  . nitroGLYCERIN (NITROSTAT) 0.4 MG SL tablet Place 1 tablet (0.4 mg total) under the tongue every 5 (five) minutes as needed for chest pain.  . pantoprazole (PROTONIX) 40 MG tablet Take 40 mg by mouth daily.  . QUEtiapine (SEROQUEL) 25 MG tablet Take 1 tablet (25 mg total) by mouth at bedtime.  . ranolazine (RANEXA) 500 MG 12 hr tablet Take 1 tablet (500 mg total) by mouth 2 (two) times daily.  Marland Kitchen saccharomyces boulardii (FLORASTOR) 250 MG capsule Take 250 mg by mouth 2 (two) times daily.  . traMADol (ULTRAM) 50 MG tablet TAKE 2 TABLETS BY MOUTH THREE TIMES DAILY AS NEEDED FOR PAIN  . omeprazole (PRILOSEC) 20 MG capsule TAKE 1 CAPSULE(20 MG) BY MOUTH DAILY (Patient not taking: Reported on 10/09/2018)   No facility-administered encounter medications on file as of 10/09/2018.     Activities of Daily Living In your present state of health, do you have any difficulty performing the following activities: 10/09/2018  Hearing? Y  Comment since stroke doesn't hear as well  Vision? Y  Comment left eye visual disturbance  Difficulty concentrating or making decisions? Y  Comment remembering since stroke short term  Walking or climbing stairs? Y  Comment due to stroke and cane  Dressing or bathing? N  Doing errands, shopping? N   Preparing Food and eating ? N  Using the Toilet? N  In the past six months, have you accidently leaked urine? Y  Comment notices when has to urinate will leak some  Do you have problems with loss of bowel control? N  Managing your Medications? N  Managing your Finances? N  Housekeeping or managing your Housekeeping? N  Some recent data might be hidden    Patient Care Team: [name], DO as PCP - General (Osteopathic Medicine) Stanford Breed Denice Bors, MD as PCP - Cardiology (Cardiology)    Assessment:   This is a routine wellness examination for Tz[place].Physical assessment deferred to PCP.   Exercise Activities and Dietary recommendations Current Exercise Habits: The patient does not participate in regular exercise at present, Exercise limited by: orthopedic condition(s);neurologic condition(s) Diet  Eats a healthy diet of vegetables, fruits and protein- having trouble with carbs Breakfast:oatmeal, grits or cereal and banana Lunch: skips- cheese and crackers Dinner:  Meat and vegetables and fruit   Drinks water 4-5 bottles a day  Goals    . Increase physical activity     Discussed working on walking more. At least working up to 30 minutes 3 times a week.  Start out with walking 5 minutes to statr to build up youur endurance. Continue to drink water daily. Make sure taking your calcium with Vitamin D    . Weight (lb) < 200 lb (90.7 kg)     Would like to loose 60 pounds by next year       Fall Risk Fall Risk  10/09/2018 09/19/2018 09/25/2017 10/03/2016 08/20/2015  Falls in the past year? 1 1 No No No  Number falls in past yr: 0 1 - - -  Injury with Fall? 0  0 - - -  Risk for fall due to : Impaired balance/gait;Impaired mobility;Impaired vision Impaired balance/gait - - -  Follow up Falls prevention discussed Falls prevention discussed - - -   Is the patient's home free of loose throw rugs in walkways, pet beds, electrical cords, etc?   yes      Grab bars in the bathroom? yes       Handrails on the stairs?   no      Adequate lighting?   yes   Depression Screen PHQ 2/9 Scores 10/09/2018 09/19/2018 09/25/2017 02/01/2017  PHQ - 2 Score 1 0 2 2  PHQ- 9 Score - - 3 11  Exception Documentation Medical reason - - -     Cognitive Function     6CIT Screen 10/09/2018 09/25/2017  What Year? 0 points 0 points  What month? 0 points 0 points  What time? 0 points 0 points  Count back from 20 0 points 0 points  Months in reverse 0 points -  Repeat phrase 0 points 2 points  Total Score 0 -    Immunization History  Administered Date(s) Administered  . Fluad Quad(high Dose 65+) 09/19/2018  . Influenza, High Dose Seasonal PF 09/29/2016, 11/16/2017  . Influenza-Unspecified 11/24/2015  . Tdap 02/23/2018    Screening Tests Health Maintenance  Topic Date Due  . Hepatitis C Screening  1945-07-03  . COLONOSCOPY  12/09/1993  . DEXA SCAN  12/09/2008  . PNA vac Low Risk Adult (1 of 2 - PCV13) 12/09/2008  . MAMMOGRAM  03/31/2019  . TETANUS/TDAP  02/24/2028  . INFLUENZA VACCINE  Completed       Plan:      Carrie Salazar , Thank you for taking time to come for your Medicare Wellness Visit. I appreciate your ongoing commitment to your health goals. Please review the following plan we discussed and let me know if I can assist you in the future.  Please schedule your next medicare wellness visit with me in 1 yr. Continue doing brain stimulating activities (puzzles, reading, adult coloring books, staying active) to keep memory sharp.   These are the goals we discussed: Goals    . Increase physical activity     Discussed working on walking more. At least working up to 30 minutes 3 times a week.  Start out with walking 5 minutes to statr to build up youur endurance. Continue to drink water daily. Make sure taking your calcium with Vitamin D    . Weight (lb) < 200 lb (90.7 kg)     Would like to loose 60 pounds by next year       This is a list of the screening recommended for  you and due dates:  Health Maintenance  Topic Date Due  .  Hepatitis C: One time screening is recommended by Center for Disease Control  (CDC) for  adults born from [birth year] through 1967.   September 02, 1945  . Colon Cancer Screening  12/09/1993  . DEXA scan (bone density measurement)  12/09/2008  . Pneumonia vaccines (1 of 2 - PCV13) 12/09/2008  . Mammogram  03/31/2019  . Tetanus Vaccine  02/24/2028  . Flu Shot  Completed      I have personally reviewed and noted the following in the patient's chart:   . Medical and social history . Use of alcohol, tobacco or illicit drugs  . Current medications and supplements . Functional ability and status . Nutritional status . Physical activity . Advanced directives . List  of other physicians . Hospitalizations, surgeries, and ER visits in previous 12 months . Vitals . Screenings to include cognitive, depression, and falls . Referrals and appointments  In addition, I have reviewed and discussed with patient certain preventive protocols, quality metrics, and best practice recommendations. A written personalized care plan for preventive services as well as general preventive health recommendations were provided to patient.     Joanne Chars, LPN  [id number]

## 2018-10-09 ENCOUNTER — Ambulatory Visit (INDEPENDENT_AMBULATORY_CARE_PROVIDER_SITE_OTHER): Payer: Medicare Other | Admitting: *Deleted

## 2018-10-09 ENCOUNTER — Encounter: Payer: Self-pay | Admitting: Physical Therapy

## 2018-10-09 ENCOUNTER — Other Ambulatory Visit: Payer: Self-pay

## 2018-10-09 VITALS — BP 139/53 | HR 73 | Temp 98.9°F | Ht 65.0 in | Wt 214.0 lb

## 2018-10-09 DIAGNOSIS — Z9189 Other specified personal risk factors, not elsewhere classified: Secondary | ICD-10-CM | POA: Diagnosis not present

## 2018-10-09 DIAGNOSIS — Z1211 Encounter for screening for malignant neoplasm of colon: Secondary | ICD-10-CM | POA: Diagnosis not present

## 2018-10-09 DIAGNOSIS — Z1159 Encounter for screening for other viral diseases: Secondary | ICD-10-CM

## 2018-10-09 DIAGNOSIS — Z13820 Encounter for screening for osteoporosis: Secondary | ICD-10-CM | POA: Diagnosis not present

## 2018-10-09 DIAGNOSIS — Z0000 Encounter for general adult medical examination without abnormal findings: Secondary | ICD-10-CM | POA: Diagnosis not present

## 2018-10-09 DIAGNOSIS — Z1239 Encounter for other screening for malignant neoplasm of breast: Secondary | ICD-10-CM

## 2018-10-09 NOTE — Patient Instructions (Signed)
Carrie Salazar , Thank you for taking time to come for your Medicare Wellness Visit. I appreciate your ongoing commitment to your health goals. Please review the following plan we discussed and let me know if I can assist you in the future.  Please schedule your next medicare wellness visit with me in 1 yr. Continue doing brain stimulating activities (puzzles, reading, adult coloring books, staying active) to keep memory sharp. These are the goals we discussed: Goals    . Increase physical activity     Discussed working on walking more. At least working up to 30 minutes 3 times a week.  Start out with walking 5 minutes to statr to build up youur endurance. Continue to drink water daily. Make sure taking your calcium with Vitamin D    . Weight (lb) < 200 lb (90.7 kg)     Would like to loose 60 pounds by next year

## 2018-10-10 LAB — HEPATITIS C ANTIBODY
Hepatitis C Ab: NONREACTIVE
SIGNAL TO CUT-OFF: 0.01 (ref <1.00)

## 2018-10-10 NOTE — Progress Notes (Signed)
Negative hep C

## 2018-10-30 DIAGNOSIS — I270 Primary pulmonary hypertension: Secondary | ICD-10-CM | POA: Diagnosis not present

## 2018-10-30 DIAGNOSIS — R55 Syncope and collapse: Secondary | ICD-10-CM | POA: Diagnosis not present

## 2018-10-31 ENCOUNTER — Other Ambulatory Visit: Payer: Self-pay | Admitting: Osteopathic Medicine

## 2018-10-31 DIAGNOSIS — G5793 Unspecified mononeuropathy of bilateral lower limbs: Secondary | ICD-10-CM

## 2018-11-02 ENCOUNTER — Other Ambulatory Visit: Payer: Self-pay | Admitting: *Deleted

## 2018-11-02 MED ORDER — LISINOPRIL 40 MG PO TABS: 40.0000 mg | ORAL_TABLET | Freq: Every day | ORAL | 1 refills | Status: AC

## 2018-11-02 NOTE — Telephone Encounter (Signed)
Rx has been sent to the pharmacy electronically. ° °

## 2018-11-13 ENCOUNTER — Telehealth: Payer: Self-pay

## 2018-11-13 NOTE — Telephone Encounter (Signed)
[facility] pharmacy requesting med refill for tramadol.

## 2018-11-14 MED ORDER — TRAMADOL HCL 50 MG PO TABS: ORAL_TABLET | ORAL | 0 refills | Status: AC

## 2018-11-14 NOTE — Telephone Encounter (Signed)
Left a detailed vm msg for pt regarding med refill sent to local pharmacy. Direct call back info provided.  

## 2018-11-14 NOTE — Telephone Encounter (Signed)
Sent!

## 2018-11-30 DIAGNOSIS — I270 Primary pulmonary hypertension: Secondary | ICD-10-CM | POA: Diagnosis not present

## 2018-11-30 DIAGNOSIS — R55 Syncope and collapse: Secondary | ICD-10-CM | POA: Diagnosis not present

## 2018-12-10 ENCOUNTER — Other Ambulatory Visit: Payer: Self-pay | Admitting: Osteopathic Medicine

## 2018-12-10 MED ORDER — QUETIAPINE FUMARATE 25 MG PO TABS: 25.0000 mg | ORAL_TABLET | Freq: Every day | ORAL | 0 refills | Status: AC

## 2018-12-10 NOTE — Telephone Encounter (Signed)
Patient is requesting a refill of her Seroquel sent to [facility] in [place]. Please advise.

## 2018-12-13 DIAGNOSIS — L820 Inflamed seborrheic keratosis: Secondary | ICD-10-CM | POA: Diagnosis not present

## 2018-12-13 DIAGNOSIS — D049 Carcinoma in situ of skin, unspecified: Secondary | ICD-10-CM | POA: Diagnosis not present

## 2018-12-19 ENCOUNTER — Telehealth: Payer: Self-pay

## 2018-12-19 MED ORDER — ONDANSETRON 8 MG PO TBDP: 8.0000 mg | ORAL_TABLET | Freq: Three times a day (TID) | ORAL | 3 refills | Status: AC | PRN

## 2018-12-19 NOTE — Telephone Encounter (Signed)
Husband states she has nausea and fatigue. He wanted to know if she can have something for nausea.    Patient's husband advised - Increase fluid intake. Also -    People with COVID-19 have had a wide range of symptoms reported - ranging from mild symptoms to severe illness. Symptoms may appear 2-14 days after exposure to the virus. People with these symptoms may have COVID-19: . Fever or chills . Cough . Shortness of breath or difficulty breathing . Fatigue . Muscle or body aches . Headache . New loss of taste or smell . Sore throat . Congestion or runny nose . Nausea or vomiting . Diarrhea   When to Seek Emergency Medical Attention Look for emergency warning signs* for COVID-19. If someone is showing any of these signs, seek emergency medical care immediately . Trouble breathing . Persistent pain or pressure in the chest . New confusion . Inability to wake or stay awake . Bluish lips or face  How to self-isolate  . Use a separate room and bathroom for sick household members (if possible). Wendee Copp your hands often with soap and water for at least 20 seconds, especially after blowing your nose, coughing, or sneezing; going to the bathroom; and before eating or preparing food. . If soap and water are not readily available, use an alcohol-based hand sanitizer with at least 60% alcohol. Always wash hands with soap and water if hands are visibly dirty. . Provide your sick household member with clean disposable facemasks to wear at home, if available, to help prevent spreading COVID-19 to others. . Clean the sick room and bathroom, as needed, to avoid unnecessary contact with the sick person. Marland Kitchen Avoid sharing personal items like utensils, food, and drinks.   If you feel healthy but: . Recently had close contact with a person with COVID-19 Steps to take. Stay Home and Monitor Your Health [facility]) . Stay home until 14 days after your last exposure. . Check your temperature twice  a day and watch for symptoms of COVID-19. . If possible, stay away from people who are at higher-risk for getting very sick from COVID-19.  If you: . Have been diagnosed with COVID-19, or . Are waiting for test results, or . Have cough, fever, or shortness of breath, or other symptoms of COVID-19 Steps to take. Isolate Yourself from Others (Isolation) . Stay home until it is safe to be around others. . If you live with others, stay in a specific "sick room" or area and away from other people or animals, including pets. Use a separate bathroom, if available. . Read important information about caring for yourself or someone else who is sick, including when it's safe to end home isolation.

## 2018-12-19 NOTE — Telephone Encounter (Signed)
Pt's husband called stating that pt has tested positive for Covid 19. Pt was not given any medications. Husband is requesting recommendations from provider. Pls advise, thanks.

## 2018-12-19 NOTE — Telephone Encounter (Signed)
Zofran sent to [facility] on file

## 2018-12-20 NOTE — Telephone Encounter (Signed)
Left message advising of prescription.

## 2018-12-21 DIAGNOSIS — M797 Fibromyalgia: Secondary | ICD-10-CM | POA: Diagnosis not present

## 2018-12-21 DIAGNOSIS — I10 Essential (primary) hypertension: Secondary | ICD-10-CM | POA: Diagnosis not present

## 2018-12-21 DIAGNOSIS — Z4682 Encounter for fitting and adjustment of non-vascular catheter: Secondary | ICD-10-CM | POA: Diagnosis not present

## 2018-12-21 DIAGNOSIS — N200 Calculus of kidney: Secondary | ICD-10-CM | POA: Diagnosis not present

## 2018-12-21 DIAGNOSIS — J9601 Acute respiratory failure with hypoxia: Secondary | ICD-10-CM | POA: Diagnosis not present

## 2018-12-21 DIAGNOSIS — Z8673 Personal history of transient ischemic attack (TIA), and cerebral infarction without residual deficits: Secondary | ICD-10-CM | POA: Diagnosis not present

## 2018-12-21 DIAGNOSIS — J4540 Moderate persistent asthma, uncomplicated: Secondary | ICD-10-CM | POA: Diagnosis not present

## 2018-12-21 DIAGNOSIS — R0902 Hypoxemia: Secondary | ICD-10-CM | POA: Diagnosis not present

## 2018-12-21 DIAGNOSIS — R918 Other nonspecific abnormal finding of lung field: Secondary | ICD-10-CM | POA: Diagnosis not present

## 2018-12-21 DIAGNOSIS — Z951 Presence of aortocoronary bypass graft: Secondary | ICD-10-CM | POA: Diagnosis not present

## 2018-12-21 DIAGNOSIS — D6869 Other thrombophilia: Secondary | ICD-10-CM | POA: Diagnosis not present

## 2018-12-21 DIAGNOSIS — I4892 Unspecified atrial flutter: Secondary | ICD-10-CM | POA: Diagnosis not present

## 2018-12-21 DIAGNOSIS — N170 Acute kidney failure with tubular necrosis: Secondary | ICD-10-CM | POA: Diagnosis not present

## 2018-12-21 DIAGNOSIS — Z85820 Personal history of malignant melanoma of skin: Secondary | ICD-10-CM | POA: Diagnosis not present

## 2018-12-21 DIAGNOSIS — J1289 Other viral pneumonia: Secondary | ICD-10-CM | POA: Diagnosis not present

## 2018-12-21 DIAGNOSIS — A0839 Other viral enteritis: Secondary | ICD-10-CM | POA: Diagnosis not present

## 2018-12-21 DIAGNOSIS — Z87891 Personal history of nicotine dependence: Secondary | ICD-10-CM | POA: Diagnosis not present

## 2018-12-21 DIAGNOSIS — R0602 Shortness of breath: Secondary | ICD-10-CM | POA: Diagnosis not present

## 2018-12-21 DIAGNOSIS — Z79899 Other long term (current) drug therapy: Secondary | ICD-10-CM | POA: Diagnosis not present

## 2018-12-21 DIAGNOSIS — U071 COVID-19: Secondary | ICD-10-CM | POA: Diagnosis not present

## 2018-12-21 DIAGNOSIS — J984 Other disorders of lung: Secondary | ICD-10-CM | POA: Diagnosis not present

## 2018-12-21 DIAGNOSIS — R197 Diarrhea, unspecified: Secondary | ICD-10-CM | POA: Diagnosis not present

## 2018-12-21 DIAGNOSIS — Z87440 Personal history of urinary (tract) infections: Secondary | ICD-10-CM | POA: Diagnosis not present

## 2018-12-21 DIAGNOSIS — E871 Hypo-osmolality and hyponatremia: Secondary | ICD-10-CM | POA: Diagnosis not present

## 2018-12-21 DIAGNOSIS — E785 Hyperlipidemia, unspecified: Secondary | ICD-10-CM | POA: Diagnosis not present

## 2018-12-21 DIAGNOSIS — N179 Acute kidney failure, unspecified: Secondary | ICD-10-CM | POA: Diagnosis not present

## 2018-12-21 DIAGNOSIS — I2510 Atherosclerotic heart disease of native coronary artery without angina pectoris: Secondary | ICD-10-CM | POA: Diagnosis not present

## 2018-12-21 DIAGNOSIS — R55 Syncope and collapse: Secondary | ICD-10-CM | POA: Diagnosis not present

## 2018-12-21 DIAGNOSIS — J90 Pleural effusion, not elsewhere classified: Secondary | ICD-10-CM | POA: Diagnosis not present

## 2018-12-21 DIAGNOSIS — Z7982 Long term (current) use of aspirin: Secondary | ICD-10-CM | POA: Diagnosis not present

## 2018-12-21 DIAGNOSIS — G4700 Insomnia, unspecified: Secondary | ICD-10-CM | POA: Diagnosis not present

## 2018-12-21 DIAGNOSIS — R778 Other specified abnormalities of plasma proteins: Secondary | ICD-10-CM | POA: Diagnosis not present

## 2018-12-21 DIAGNOSIS — I270 Primary pulmonary hypertension: Secondary | ICD-10-CM | POA: Diagnosis not present

## 2018-12-21 DIAGNOSIS — J9621 Acute and chronic respiratory failure with hypoxia: Secondary | ICD-10-CM | POA: Diagnosis not present

## 2018-12-21 DIAGNOSIS — R0603 Acute respiratory distress: Secondary | ICD-10-CM | POA: Diagnosis not present

## 2018-12-21 DIAGNOSIS — E440 Moderate protein-calorie malnutrition: Secondary | ICD-10-CM | POA: Diagnosis not present

## 2018-12-21 DIAGNOSIS — G9341 Metabolic encephalopathy: Secondary | ICD-10-CM | POA: Diagnosis not present

## 2018-12-21 NOTE — Telephone Encounter (Signed)
Patient spouse was concerned that his wife was feeling very weak and dehydrated. She was having some new onset confusion and per PCP recommendations she needs to go to the ER to be evaluated. The spouse did not have any questions.

## 2018-12-22 MED ORDER — ESCITALOPRAM OXALATE 10 MG PO TABS
10.00 | ORAL_TABLET | ORAL | Status: DC
Start: 2018-12-22 — End: 2018-12-22

## 2018-12-22 MED ORDER — Medication
Status: DC
Start: ? — End: 2018-12-22

## 2018-12-22 MED ORDER — CHLOROBUTANOL-EUGENOL & APAP
0.40 | Status: DC
Start: ? — End: 2018-12-22

## 2018-12-22 MED ORDER — GENERIC EXTERNAL MEDICATION
Status: DC
Start: ? — End: 2018-12-22

## 2018-12-22 MED ORDER — BARO-CAT PO
10.00 | ORAL | Status: DC
Start: ? — End: 2018-12-22

## 2018-12-22 MED ORDER — V-R ANTI-GAS MAXIMUM STRENGTH 166 MG PO CAPS
10.00 | ORAL_CAPSULE | ORAL | Status: DC
Start: 2018-12-22 — End: 2018-12-22

## 2018-12-22 MED ORDER — AMPHOTER B CHOLEST SULF CMPLX
81.00 | Status: DC
Start: 2018-12-22 — End: 2018-12-22

## 2018-12-22 MED ORDER — SUBDUE PLUS PO LIQD
75.00 | ORAL | Status: DC
Start: 2018-12-22 — End: 2018-12-22

## 2018-12-22 MED ORDER — BETAGAN WITHOUT C CAP OP
100.00 | OPHTHALMIC | Status: DC
Start: 2018-12-22 — End: 2018-12-22

## 2018-12-22 MED ORDER — TRONOLANE 0.25 % RE SUPP
2.50 | RECTAL | Status: DC
Start: 2018-12-22 — End: 2018-12-22

## 2018-12-22 MED ORDER — OVULATION PREDICTOR ONE STEP VI TEST
0.10 | Status: DC
Start: ? — End: 2018-12-22

## 2018-12-22 MED ORDER — Medication
500.00 | Status: DC
Start: 2018-12-23 — End: 2018-12-22

## 2018-12-22 MED ORDER — BROMFED DM PO
25.00 | ORAL | Status: DC
Start: 2018-12-22 — End: 2018-12-22

## 2018-12-22 MED ORDER — QUINERVA 260 MG PO TABS
650.00 | ORAL_TABLET | ORAL | Status: DC
Start: ? — End: 2018-12-22

## 2018-12-22 MED ORDER — METHYLPHENIDATE HCL POWD
100.00 | Status: DC
Start: ? — End: 2018-12-22

## 2018-12-22 MED ORDER — AMINO ACID ELECTROLYTE IN D25W
500.00 | Status: DC
Start: 2018-12-22 — End: 2018-12-22

## 2018-12-22 MED ORDER — ASPIRIN 600 MG RE SUPP
2.00 | RECTAL | Status: DC
Start: 2018-12-22 — End: 2018-12-22

## 2018-12-22 MED ORDER — OLANZAPINE-FLUOXETINE HCL 6-50 MG PO CAPS
3.00 | ORAL_CAPSULE | ORAL | Status: DC
Start: 2018-12-22 — End: 2018-12-22

## 2018-12-22 MED ORDER — BAYER WOMENS 81-300 MG PO TABS
40.00 | ORAL_TABLET | ORAL | Status: DC
Start: 2018-12-22 — End: 2018-12-22

## 2018-12-22 MED ORDER — PHENYLEPHRINE-GUAIFENESIN 30-400 MG PO CP12
5.00 | ORAL_CAPSULE | ORAL | Status: DC
Start: 2018-12-22 — End: 2018-12-22

## 2018-12-22 MED ORDER — APAP PO
20.00 | ORAL | Status: DC
Start: 2018-12-22 — End: 2018-12-22

## 2018-12-22 MED ORDER — DURACLON EP
1000.00 | EPIDURAL | Status: DC
Start: 2018-12-22 — End: 2018-12-22

## 2018-12-22 MED ORDER — BARO-CAT PO
50.00 | ORAL | Status: DC
Start: ? — End: 2018-12-22

## 2018-12-22 MED ORDER — PCCA BIOPEPTIDE BASE EX CREA
300.00 | TOPICAL_CREAM | CUTANEOUS | Status: DC
Start: 2018-12-22 — End: 2018-12-22

## 2018-12-22 MED ORDER — BILIRUBIN (URINE) TEST VI
30.00 | Status: DC
Start: 2018-12-22 — End: 2018-12-22

## 2018-12-22 MED ORDER — ARMOUR THYROID 30 MG PO TABS
2.00 | ORAL_TABLET | ORAL | Status: DC
Start: ? — End: 2018-12-22

## 2019-01-01 MED ORDER — GLYCOPYRROLATE 0.2 MG/ML IJ SOLN
0.20 | INTRAMUSCULAR | Status: DC
Start: ? — End: 2019-01-01

## 2019-01-01 MED ORDER — GLUCOSE 40 % PO GEL
15.00 | ORAL | Status: DC
Start: ? — End: 2019-01-01

## 2019-01-01 MED ORDER — GENERIC EXTERNAL MEDICATION
Status: DC
Start: ? — End: 2019-01-01

## 2019-01-01 MED ORDER — QUETIAPINE FUMARATE 25 MG PO TABS
25.00 | ORAL_TABLET | ORAL | Status: DC
Start: 2018-12-31 — End: 2019-01-01

## 2019-01-01 MED ORDER — LORAZEPAM 2 MG/ML IJ SOLN
1.00 | INTRAMUSCULAR | Status: DC
Start: ? — End: 2019-01-01

## 2019-01-01 MED ORDER — SODIUM CHLORIDE 0.9 % IV SOLN
10.00 | INTRAVENOUS | Status: DC
Start: ? — End: 2019-01-01

## 2019-01-01 MED ORDER — FENTANYL CITRATE-NACL 2.5-0.9 MG/250ML-% IV SOLN
1.00 | INTRAVENOUS | Status: DC
Start: ? — End: 2019-01-01

## 2019-01-01 MED ORDER — RANOLAZINE ER 500 MG PO TB12
500.00 | ORAL_TABLET | ORAL | Status: DC
Start: 2018-12-31 — End: 2019-01-01

## 2019-01-11 DEATH — deceased

## 2019-10-15 ENCOUNTER — Ambulatory Visit: Payer: Medicare Other
